# Patient Record
Sex: Female | Born: 1935 | Race: White | Hispanic: No | Smoking: Never smoker
Health system: Southern US, Community
[De-identification: ages and names within clinical notes are randomized; demographics above are authoritative.]

## PROBLEM LIST (undated history)

## (undated) DIAGNOSIS — K219 Gastro-esophageal reflux disease without esophagitis: Secondary | ICD-10-CM

## (undated) DIAGNOSIS — K56609 Unspecified intestinal obstruction, unspecified as to partial versus complete obstruction: Secondary | ICD-10-CM

## (undated) DIAGNOSIS — E039 Hypothyroidism, unspecified: Secondary | ICD-10-CM

## (undated) DIAGNOSIS — K2270 Barrett's esophagus without dysplasia: Secondary | ICD-10-CM

## (undated) DIAGNOSIS — E079 Disorder of thyroid, unspecified: Secondary | ICD-10-CM

## (undated) DIAGNOSIS — K913 Postprocedural intestinal obstruction, unspecified as to partial versus complete: Secondary | ICD-10-CM

## (undated) DIAGNOSIS — Z8719 Personal history of other diseases of the digestive system: Secondary | ICD-10-CM

## (undated) DIAGNOSIS — F419 Anxiety disorder, unspecified: Secondary | ICD-10-CM

## (undated) DIAGNOSIS — M1990 Unspecified osteoarthritis, unspecified site: Secondary | ICD-10-CM

## (undated) DIAGNOSIS — Q433 Congenital malformations of intestinal fixation: Secondary | ICD-10-CM

## (undated) DIAGNOSIS — R112 Nausea with vomiting, unspecified: Secondary | ICD-10-CM

## (undated) DIAGNOSIS — K5652 Intestinal adhesions [bands] with complete obstruction: Secondary | ICD-10-CM

## (undated) HISTORY — DX: Congenital malformations of intestinal fixation: Q43.3

## (undated) HISTORY — DX: Unspecified intestinal obstruction, unspecified as to partial versus complete obstruction: K56.609

## (undated) HISTORY — DX: Nausea with vomiting, unspecified: R11.2

## (undated) HISTORY — PX: TUBAL LIGATION: SHX77

## (undated) HISTORY — DX: Postprocedural intestinal obstruction, unspecified as to partial versus complete: K91.30

## (undated) HISTORY — DX: Intestinal adhesions (bands) with complete obstruction: K56.52

## (undated) HISTORY — PX: TONSILLECTOMY: SUR1361

---

## 1998-03-17 DIAGNOSIS — K2270 Barrett's esophagus without dysplasia: Secondary | ICD-10-CM

## 1998-03-17 HISTORY — DX: Barrett's esophagus without dysplasia: K22.70

## 2000-03-17 HISTORY — PX: PARTIAL COLECTOMY: SHX5273

## 2000-03-17 HISTORY — PX: APPENDECTOMY: SHX54

## 2015-03-29 DIAGNOSIS — H25813 Combined forms of age-related cataract, bilateral: Secondary | ICD-10-CM | POA: Diagnosis not present

## 2015-04-18 DIAGNOSIS — K6389 Other specified diseases of intestine: Secondary | ICD-10-CM | POA: Diagnosis not present

## 2015-04-18 DIAGNOSIS — K5900 Constipation, unspecified: Secondary | ICD-10-CM | POA: Diagnosis not present

## 2015-04-19 DIAGNOSIS — E785 Hyperlipidemia, unspecified: Secondary | ICD-10-CM | POA: Diagnosis not present

## 2015-04-26 DIAGNOSIS — E785 Hyperlipidemia, unspecified: Secondary | ICD-10-CM | POA: Diagnosis not present

## 2015-04-26 DIAGNOSIS — E039 Hypothyroidism, unspecified: Secondary | ICD-10-CM | POA: Diagnosis not present

## 2015-04-26 DIAGNOSIS — Z0000 Encounter for general adult medical examination without abnormal findings: Secondary | ICD-10-CM | POA: Diagnosis not present

## 2015-04-26 DIAGNOSIS — K219 Gastro-esophageal reflux disease without esophagitis: Secondary | ICD-10-CM | POA: Diagnosis not present

## 2015-08-16 DIAGNOSIS — Z79899 Other long term (current) drug therapy: Secondary | ICD-10-CM | POA: Diagnosis not present

## 2015-08-23 DIAGNOSIS — R1310 Dysphagia, unspecified: Secondary | ICD-10-CM | POA: Diagnosis not present

## 2015-08-23 DIAGNOSIS — E785 Hyperlipidemia, unspecified: Secondary | ICD-10-CM | POA: Diagnosis not present

## 2015-08-23 DIAGNOSIS — E039 Hypothyroidism, unspecified: Secondary | ICD-10-CM | POA: Diagnosis not present

## 2015-09-04 DIAGNOSIS — R109 Unspecified abdominal pain: Secondary | ICD-10-CM

## 2015-09-04 DIAGNOSIS — G8929 Other chronic pain: Secondary | ICD-10-CM | POA: Insufficient documentation

## 2015-09-04 DIAGNOSIS — E039 Hypothyroidism, unspecified: Secondary | ICD-10-CM | POA: Insufficient documentation

## 2015-09-04 DIAGNOSIS — E785 Hyperlipidemia, unspecified: Secondary | ICD-10-CM | POA: Insufficient documentation

## 2015-09-04 DIAGNOSIS — K219 Gastro-esophageal reflux disease without esophagitis: Secondary | ICD-10-CM | POA: Insufficient documentation

## 2015-09-05 DIAGNOSIS — R1310 Dysphagia, unspecified: Secondary | ICD-10-CM | POA: Diagnosis not present

## 2015-09-05 DIAGNOSIS — K5790 Diverticulosis of intestine, part unspecified, without perforation or abscess without bleeding: Secondary | ICD-10-CM | POA: Diagnosis not present

## 2015-09-05 DIAGNOSIS — K219 Gastro-esophageal reflux disease without esophagitis: Secondary | ICD-10-CM | POA: Diagnosis not present

## 2015-09-26 DIAGNOSIS — G8929 Other chronic pain: Secondary | ICD-10-CM | POA: Diagnosis not present

## 2015-09-26 DIAGNOSIS — R109 Unspecified abdominal pain: Secondary | ICD-10-CM | POA: Diagnosis not present

## 2015-09-26 DIAGNOSIS — R1319 Other dysphagia: Secondary | ICD-10-CM | POA: Insufficient documentation

## 2015-09-26 DIAGNOSIS — K219 Gastro-esophageal reflux disease without esophagitis: Secondary | ICD-10-CM | POA: Diagnosis not present

## 2015-09-26 DIAGNOSIS — R1311 Dysphagia, oral phase: Secondary | ICD-10-CM | POA: Diagnosis not present

## 2015-09-26 DIAGNOSIS — R1310 Dysphagia, unspecified: Secondary | ICD-10-CM | POA: Insufficient documentation

## 2015-12-24 DIAGNOSIS — H25813 Combined forms of age-related cataract, bilateral: Secondary | ICD-10-CM | POA: Diagnosis not present

## 2015-12-27 DIAGNOSIS — Z23 Encounter for immunization: Secondary | ICD-10-CM | POA: Diagnosis not present

## 2016-02-13 DIAGNOSIS — E038 Other specified hypothyroidism: Secondary | ICD-10-CM | POA: Diagnosis not present

## 2016-02-13 DIAGNOSIS — R1311 Dysphagia, oral phase: Secondary | ICD-10-CM | POA: Diagnosis not present

## 2016-02-13 DIAGNOSIS — E782 Mixed hyperlipidemia: Secondary | ICD-10-CM | POA: Diagnosis not present

## 2016-02-13 DIAGNOSIS — M810 Age-related osteoporosis without current pathological fracture: Secondary | ICD-10-CM | POA: Diagnosis not present

## 2016-03-26 DIAGNOSIS — M85852 Other specified disorders of bone density and structure, left thigh: Secondary | ICD-10-CM | POA: Diagnosis not present

## 2016-03-26 DIAGNOSIS — M810 Age-related osteoporosis without current pathological fracture: Secondary | ICD-10-CM | POA: Diagnosis not present

## 2016-03-26 DIAGNOSIS — Z13820 Encounter for screening for osteoporosis: Secondary | ICD-10-CM | POA: Diagnosis not present

## 2016-06-27 DIAGNOSIS — E785 Hyperlipidemia, unspecified: Secondary | ICD-10-CM | POA: Diagnosis not present

## 2016-07-03 DIAGNOSIS — R938 Abnormal findings on diagnostic imaging of other specified body structures: Secondary | ICD-10-CM | POA: Diagnosis not present

## 2016-07-03 DIAGNOSIS — R05 Cough: Secondary | ICD-10-CM | POA: Diagnosis not present

## 2016-07-03 DIAGNOSIS — J0100 Acute maxillary sinusitis, unspecified: Secondary | ICD-10-CM | POA: Diagnosis not present

## 2016-07-03 DIAGNOSIS — J208 Acute bronchitis due to other specified organisms: Secondary | ICD-10-CM | POA: Diagnosis not present

## 2016-07-16 DIAGNOSIS — M4125 Other idiopathic scoliosis, thoracolumbar region: Secondary | ICD-10-CM | POA: Insufficient documentation

## 2016-07-16 DIAGNOSIS — E782 Mixed hyperlipidemia: Secondary | ICD-10-CM | POA: Diagnosis not present

## 2016-07-16 DIAGNOSIS — E038 Other specified hypothyroidism: Secondary | ICD-10-CM | POA: Diagnosis not present

## 2016-07-16 DIAGNOSIS — E063 Autoimmune thyroiditis: Secondary | ICD-10-CM | POA: Diagnosis not present

## 2016-07-19 DIAGNOSIS — N3001 Acute cystitis with hematuria: Secondary | ICD-10-CM | POA: Diagnosis not present

## 2016-07-19 DIAGNOSIS — R300 Dysuria: Secondary | ICD-10-CM | POA: Diagnosis not present

## 2016-11-12 ENCOUNTER — Encounter: Payer: Self-pay | Admitting: Emergency Medicine

## 2016-11-12 DIAGNOSIS — K219 Gastro-esophageal reflux disease without esophagitis: Secondary | ICD-10-CM | POA: Diagnosis present

## 2016-11-12 DIAGNOSIS — Z9889 Other specified postprocedural states: Secondary | ICD-10-CM

## 2016-11-12 DIAGNOSIS — K56699 Other intestinal obstruction unspecified as to partial versus complete obstruction: Secondary | ICD-10-CM | POA: Diagnosis not present

## 2016-11-12 DIAGNOSIS — Z885 Allergy status to narcotic agent status: Secondary | ICD-10-CM | POA: Diagnosis not present

## 2016-11-12 DIAGNOSIS — K562 Volvulus: Secondary | ICD-10-CM | POA: Diagnosis not present

## 2016-11-12 DIAGNOSIS — Z9049 Acquired absence of other specified parts of digestive tract: Secondary | ICD-10-CM | POA: Diagnosis not present

## 2016-11-12 DIAGNOSIS — R112 Nausea with vomiting, unspecified: Secondary | ICD-10-CM | POA: Diagnosis not present

## 2016-11-12 DIAGNOSIS — R1110 Vomiting, unspecified: Secondary | ICD-10-CM | POA: Diagnosis not present

## 2016-11-12 DIAGNOSIS — Z9851 Tubal ligation status: Secondary | ICD-10-CM | POA: Diagnosis not present

## 2016-11-12 DIAGNOSIS — R9431 Abnormal electrocardiogram [ECG] [EKG]: Secondary | ICD-10-CM | POA: Diagnosis not present

## 2016-11-12 DIAGNOSIS — K56609 Unspecified intestinal obstruction, unspecified as to partial versus complete obstruction: Secondary | ICD-10-CM | POA: Diagnosis not present

## 2016-11-12 DIAGNOSIS — K5900 Constipation, unspecified: Secondary | ICD-10-CM | POA: Diagnosis not present

## 2016-11-12 LAB — CBC
HCT: 43.2 % (ref 35.0–47.0)
HEMOGLOBIN: 14.8 g/dL (ref 12.0–16.0)
MCH: 29.7 pg (ref 26.0–34.0)
MCHC: 34.3 g/dL (ref 32.0–36.0)
MCV: 86.5 fL (ref 80.0–100.0)
Platelets: 251 10*3/uL (ref 150–440)
RBC: 4.99 MIL/uL (ref 3.80–5.20)
RDW: 13.5 % (ref 11.5–14.5)
WBC: 11.9 10*3/uL — ABNORMAL HIGH (ref 3.6–11.0)

## 2016-11-12 NOTE — ED Notes (Signed)
Pt unable to void at this time. 

## 2016-11-12 NOTE — ED Triage Notes (Addendum)
Pt to triage via wheelchair. Pt has constipation and vomiting   Vomiting x 4.  Last bm was yesterday.  Denies abd pain   No chest pain or sob.   Pt alert.  Speech clear.

## 2016-11-13 ENCOUNTER — Inpatient Hospital Stay
Admission: EM | Admit: 2016-11-13 | Discharge: 2016-11-14 | DRG: 390 | Disposition: A | Discharge status: Home / Self Care | Payer: Medicare Other | Attending: Surgery | Admitting: Surgery

## 2016-11-13 ENCOUNTER — Emergency Department: Payer: Medicare Other

## 2016-11-13 DIAGNOSIS — R109 Unspecified abdominal pain: Secondary | ICD-10-CM | POA: Diagnosis not present

## 2016-11-13 DIAGNOSIS — Q433 Congenital malformations of intestinal fixation: Secondary | ICD-10-CM | POA: Diagnosis not present

## 2016-11-13 DIAGNOSIS — R112 Nausea with vomiting, unspecified: Secondary | ICD-10-CM

## 2016-11-13 DIAGNOSIS — K219 Gastro-esophageal reflux disease without esophagitis: Secondary | ICD-10-CM | POA: Diagnosis present

## 2016-11-13 DIAGNOSIS — Z9889 Other specified postprocedural states: Secondary | ICD-10-CM | POA: Diagnosis not present

## 2016-11-13 DIAGNOSIS — Z9851 Tubal ligation status: Secondary | ICD-10-CM | POA: Diagnosis not present

## 2016-11-13 DIAGNOSIS — K5900 Constipation, unspecified: Secondary | ICD-10-CM | POA: Diagnosis not present

## 2016-11-13 DIAGNOSIS — Z9049 Acquired absence of other specified parts of digestive tract: Secondary | ICD-10-CM | POA: Diagnosis not present

## 2016-11-13 DIAGNOSIS — Z885 Allergy status to narcotic agent status: Secondary | ICD-10-CM | POA: Diagnosis not present

## 2016-11-13 DIAGNOSIS — K562 Volvulus: Secondary | ICD-10-CM | POA: Diagnosis present

## 2016-11-13 DIAGNOSIS — K56609 Unspecified intestinal obstruction, unspecified as to partial versus complete obstruction: Secondary | ICD-10-CM

## 2016-11-13 DIAGNOSIS — R1110 Vomiting, unspecified: Secondary | ICD-10-CM | POA: Diagnosis not present

## 2016-11-13 HISTORY — DX: Gastro-esophageal reflux disease without esophagitis: K21.9

## 2016-11-13 HISTORY — DX: Disorder of thyroid, unspecified: E07.9

## 2016-11-13 HISTORY — DX: Unspecified intestinal obstruction, unspecified as to partial versus complete obstruction: K56.609

## 2016-11-13 LAB — COMPREHENSIVE METABOLIC PANEL
ALBUMIN: 4.3 g/dL (ref 3.5–5.0)
ALK PHOS: 64 U/L (ref 38–126)
ALT: 24 U/L (ref 14–54)
AST: 33 U/L (ref 15–41)
Anion gap: 13 (ref 5–15)
BILIRUBIN TOTAL: 1.6 mg/dL — AB (ref 0.3–1.2)
BUN: 15 mg/dL (ref 6–20)
CALCIUM: 10.2 mg/dL (ref 8.9–10.3)
CO2: 28 mmol/L (ref 22–32)
CREATININE: 0.78 mg/dL (ref 0.44–1.00)
Chloride: 98 mmol/L — ABNORMAL LOW (ref 101–111)
GFR calc Af Amer: >60 mL/min (ref >60)
GFR calc non Af Amer: >60 mL/min (ref >60)
GLUCOSE: 183 mg/dL — AB (ref 65–99)
Potassium: 3.7 mmol/L (ref 3.5–5.1)
SODIUM: 139 mmol/L (ref 135–145)
TOTAL PROTEIN: 7.3 g/dL (ref 6.5–8.1)

## 2016-11-13 LAB — URINALYSIS, COMPLETE (UACMP) WITH MICROSCOPIC
BACTERIA UA: NONE SEEN
BILIRUBIN URINE: NEGATIVE
GLUCOSE, UA: NEGATIVE mg/dL
Hgb urine dipstick: NEGATIVE
KETONES UR: 20 mg/dL — AB
Nitrite: NEGATIVE
PH: 8 (ref 5.0–8.0)
Protein, ur: NEGATIVE mg/dL
Specific Gravity, Urine: 1.011 (ref 1.005–1.030)

## 2016-11-13 LAB — LACTIC ACID, PLASMA
Lactic Acid, Venous: 1.5 mmol/L (ref 0.5–1.9)
Lactic Acid, Venous: 1.2 mmol/L (ref 0.5–1.9)

## 2016-11-13 LAB — LIPASE, BLOOD: Lipase: 25 U/L (ref 11–51)

## 2016-11-13 MED ORDER — IOPAMIDOL (ISOVUE-300) INJECTION 61%
15.0000 mL | INTRAVENOUS | Status: AC
  Administered 2016-11-13 (×2): 15 mL via ORAL

## 2016-11-13 MED ORDER — ONDANSETRON HCL 4 MG/2ML IJ SOLN: 4.0000 mg | Freq: Four times a day (QID) | INTRAMUSCULAR | Status: DC | PRN

## 2016-11-13 MED ORDER — SODIUM CHLORIDE 0.9 % IV BOLUS (SEPSIS)
1000.0000 mL | Freq: Once | INTRAVENOUS | Status: AC
  Administered 2016-11-13: 1000 mL via INTRAVENOUS

## 2016-11-13 MED ORDER — BISACODYL 10 MG RE SUPP
10.0000 mg | Freq: Once | RECTAL | Status: AC
  Administered 2016-11-13: 10 mg via RECTAL
  Filled 2016-11-13: qty 1

## 2016-11-13 MED ORDER — KCL IN DEXTROSE-NACL 20-5-0.45 MEQ/L-%-% IV SOLN
INTRAVENOUS | Status: DC
  Administered 2016-11-13 (×2): via INTRAVENOUS
  Administered 2016-11-13: 1000 mL via INTRAVENOUS
  Administered 2016-11-14: 07:00:00 via INTRAVENOUS
  Filled 2016-11-13 (×6): qty 1000

## 2016-11-13 MED ORDER — LACTATED RINGERS IV BOLUS (SEPSIS)
500.0000 mL | Freq: Once | INTRAVENOUS | Status: DC
  Filled 2016-11-13: qty 500

## 2016-11-13 MED ORDER — ONDANSETRON HCL 4 MG/2ML IJ SOLN
INTRAMUSCULAR | Status: AC
  Administered 2016-11-13: 4 mg
  Filled 2016-11-13: qty 2

## 2016-11-13 MED ORDER — ONDANSETRON HCL 4 MG PO TABS: 4.0000 mg | ORAL_TABLET | Freq: Four times a day (QID) | ORAL | Status: DC | PRN

## 2016-11-13 MED ORDER — ONDANSETRON HCL 4 MG/2ML IJ SOLN
4.0000 mg | Freq: Once | INTRAMUSCULAR | Status: AC
  Administered 2016-11-13: 4 mg via INTRAVENOUS
  Filled 2016-11-13: qty 2

## 2016-11-13 MED ORDER — MORPHINE SULFATE (PF) 2 MG/ML IV SOLN: 2.0000 mg | INTRAVENOUS | Status: DC | PRN

## 2016-11-13 MED ORDER — IOPAMIDOL (ISOVUE-300) INJECTION 61%
100.0000 mL | Freq: Once | INTRAVENOUS | Status: AC | PRN
  Administered 2016-11-13: 100 mL via INTRAVENOUS

## 2016-11-13 MED ORDER — HEPARIN SODIUM (PORCINE) 5000 UNIT/ML IJ SOLN
5000.0000 [IU] | Freq: Three times a day (TID) | INTRAMUSCULAR | Status: DC
  Administered 2016-11-13 – 2016-11-14 (×3): 5000 [IU] via SUBCUTANEOUS
  Filled 2016-11-13 (×3): qty 1

## 2016-11-13 NOTE — ED Notes (Signed)
EKG performed, given to doctor and exported.

## 2016-11-13 NOTE — ED Notes (Signed)
Pt back from CT

## 2016-11-13 NOTE — ED Notes (Signed)
Pt transported to room 211 by this tech.

## 2016-11-13 NOTE — ED Notes (Signed)
Pt up to toilet 

## 2016-11-13 NOTE — Progress Notes (Signed)
Visit patient this afternoon. She has her nasogastric tube in place. She states she's passed gas now. She has no abdominal pain. No nausea vomiting and no fevers or chills.  Vital signs are reviewed  Abdomen is distended tympanitic but completely nontender. Nasogastric tube is in place  We'll continue nasogastric suction at this time and reexamine. Should the patient worsen at any time I would recommend surgery and if this does not improve by tomorrow I would recommend surgery as well due to the high risk of bowel ischemia. She is not showing any signs of bowel ischemia at this time but will be observed.

## 2016-11-13 NOTE — Progress Notes (Signed)
Initial Nutrition Assessment  DOCUMENTATION CODES:   Non-severe (moderate) malnutrition in context of chronic illness  INTERVENTION:   RD will order supplements when diet advanced; pt likes vanilla Ensure.  If unable to initiate diet in 2-3 days recommend nutrition support via TPN as pt without inadequate oral intake for 3 days pta.   NUTRITION DIAGNOSIS:   Malnutrition (moderate) related to  (chronic constipation and advanced age ) as evidenced by severe depletion of muscle mass over entire body, moderate depletions of body fat in chest and orbital regions.  GOAL:   Patient will meet greater than or equal to 90% of their needs  MONITOR:   Diet advancement, Labs, Weight trends, I & O's  REASON FOR ASSESSMENT:   Malnutrition Screening Tool    ASSESSMENT:   82 year old female who comes into the hospital today with some vomiting and some abdominal pain. The patient has also had constipation and has not had a bowel movement since Saturday. 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   Met with pt in room today. Pt reports poor appetite and oral intake for 3 days pta r/t nausea and constipation. Pt with NGT in place and reports nausea resolved today but complains of sore throat from tube. Pt reports chronic constipation over the past 1-2 years. She states that she has to take Miralax every day or she will become constipated. Pt avoids fiber all together as she states that this worsen's her constipation. RD briefly discussed the differences between soluble and insoluable fiber; RD will educate pt on which types of fiber she could benefit from prior to discharge. RD also briefly discussed adequate hydration. Pt reports A UBW of 113lbs; it appears that pt is weight stable. Pt also reports a history of esophageal stricture and multiple dilations "many years ago". Pt reports that she has to cut her foods, especially meats into small bites. Pt will need  Dysphagia 3 diet upon diet advancement. Pt does drink Ensure at home but not regularly. Pt with severe muscle depletions and moderate to severe fat depletions. Recommend nutrition support via TPN if unable to initiate diet in 2-3 days. Per MD note, conservative management for now to see if volvulus resolves; pt may need surgery eventually.    Medications reviewed and include: heparin, NaCl w/ 5% Dex and KCl @125ml /hr- provides 510kcal/day   Labs reviewed: Cl 98(L), tbili 1.6(H) Wbc- 11.9(H)  Nutrition-Focused physical exam completed. Findings are moderate fat depletion in orbital regions and chest, severe muscle depletions over entire body, and no edema.   Diet Order:   NPO  Skin:  Reviewed, no issues  Last BM:  8/25  Height:   Ht Readings from Last 1 Encounters:  11/12/16 5' 2"  (1.575 m)    Weight:   Wt Readings from Last 1 Encounters:  11/12/16 115 lb (52.2 kg)    Ideal Body Weight:  50 kg  BMI:  Body mass index is 21.03 kg/m.  Estimated Nutritional Needs:   Kcal:  1600-1800kcal/day   Protein:  78-89g/day   Fluid:  >1.6L/day   EDUCATION NEEDS:   Education needs addressed  Koleen Distance MS, RD, LDN Pager #[phone] After Hours Pager: [phone]

## 2016-11-13 NOTE — H&P (Signed)
Diana Owen is an 82 y.o. female.    Chief Complaint: Nausea and vomiting  HPI: This a patient with a 5-7 day history of nausea and vomiting. She believes she was constipated last week on Thursday and took some medication to help her with bowel movements. She had bowel movements on Saturday and passed gas on Saturday but has not passed any gas nor she had any bowel movements since Saturday she's been vomiting multiple times since then. She denies any abdominal pain at this time.  Of significance is the patient had this happen once before. She was down at [facility] at the time and was treated conservatively. She thinks she had a nasogastric tube but it spontaneously resolved and she had no problems for the last 2-1/2 years since that occurrence. She has had a prior colon resection for a polyp. She's also had an appendectomy and a tubal ligation.  I was asked see the patient by the emergency room physician for CT findings of a midgut volvulus.  Patient's not had a recent colonoscopy she has no weight loss but has lost height.  Patient recently moved from the [facility] area 4 weeks ago and cares for her husband who has dementia. She does not smoke or drink she is otherwise unemployed.  No family history of colon cancer etc.  Past Medical History:  Diagnosis Date  . GERD (gastroesophageal reflux disease)   . Thyroid disease     Past Surgical History:  Procedure Laterality Date  . APPENDECTOMY    . PARTIAL COLECTOMY    . TONSILLECTOMY    . TUBAL LIGATION      No family history on file. Social History:  reports that she has never smoked. She does not have any smokeless tobacco history on file. She reports that she does not drink alcohol. Her drug history is not on file.  Allergies:  Allergies  Allergen Reactions  . Codeine Nausea And Vomiting     (Not in a hospital admission)   Review of Systems  Constitutional: Negative for chills, fever and weight loss.   HENT: Negative.   Eyes: Negative.   Respiratory: Negative.   Cardiovascular: Negative.   Gastrointestinal: Positive for constipation, nausea and vomiting. Negative for abdominal pain, blood in stool, diarrhea, heartburn and melena.  Genitourinary: Negative.   Musculoskeletal: Negative.   Skin: Negative.   Neurological: Negative.   Endo/Heme/Allergies: Negative.   Psychiatric/Behavioral: Negative.      Physical Exam:  BP 130/68   Pulse 61   Temp 98.9 F (37.2 C) (Oral)   Resp 18   Ht 5' 2"  (1.575 m)   Wt 115 lb (52.2 kg)   SpO2 97%   BMI 21.03 kg/m   Physical Exam  Constitutional: She is oriented to person, place, and time and well-developed, well-nourished, and in no distress. No distress.  Appears comfortable no acute distress  HENT:  Head: Normocephalic and atraumatic.  Eyes: Pupils are equal, round, and reactive to light. Right eye exhibits no discharge. Left eye exhibits no discharge. No scleral icterus.  Neck: Normal range of motion. No JVD present. No thyromegaly present.  Cardiovascular: Normal rate and regular rhythm.   Pulmonary/Chest: Effort normal. No respiratory distress.  Abdominal: Soft. She exhibits distension. There is no tenderness. There is no rebound and no guarding.  Low midline scar extending slightly above the umbilicus. No sign of hernia abdomen is distended and tympanitic but nontender no peritoneal signs  Musculoskeletal: Normal range of motion. She exhibits  no edema or tenderness.  Lymphadenopathy:    She has no cervical adenopathy.  Neurological: She is alert and oriented to person, place, and time.  Skin: Skin is warm and dry. No rash noted. She is not diaphoretic. No erythema.  Psychiatric: Mood and affect normal.  Vitals reviewed.       Results for orders placed or performed during the hospital encounter of 11/13/16 (from the past 48 hour(s))  Lipase, blood     Status: None   Collection Time: 11/12/16 11:41 PM  Result Value Ref  Range   Lipase 25 11 - 51 U/L  Comprehensive metabolic panel     Status: Abnormal   Collection Time: 11/12/16 11:41 PM  Result Value Ref Range   Sodium 139 135 - 145 mmol/L   Potassium 3.7 3.5 - 5.1 mmol/L   Chloride 98 (L) 101 - 111 mmol/L   CO2 28 22 - 32 mmol/L   Glucose, Bld 183 (H) 65 - 99 mg/dL   BUN 15 6 - 20 mg/dL   Creatinine, Ser 0.78 0.44 - 1.00 mg/dL   Calcium 10.2 8.9 - 10.3 mg/dL   Total Protein 7.3 6.5 - 8.1 g/dL   Albumin 4.3 3.5 - 5.0 g/dL   AST 33 15 - 41 U/L   ALT 24 14 - 54 U/L   Alkaline Phosphatase 64 38 - 126 U/L   Total Bilirubin 1.6 (H) 0.3 - 1.2 mg/dL   GFR calc non Af Amer >60 >60 mL/min   GFR calc Af Amer >60 >60 mL/min    Comment: (NOTE) The eGFR has been calculated using the CKD EPI equation. This calculation has not been validated in all clinical situations. eGFR's persistently <60 mL/min signify possible Chronic Kidney Disease.    Anion gap 13 5 - 15  CBC     Status: Abnormal   Collection Time: 11/12/16 11:41 PM  Result Value Ref Range   WBC 11.9 (H) 3.6 - 11.0 K/uL   RBC 4.99 3.80 - 5.20 MIL/uL   Hemoglobin 14.8 12.0 - 16.0 g/dL   HCT 43.2 35.0 - 47.0 %   MCV 86.5 80.0 - 100.0 fL   MCH 29.7 26.0 - 34.0 pg   MCHC 34.3 32.0 - 36.0 g/dL   RDW 13.5 11.5 - 14.5 %   Platelets 251 150 - 440 K/uL  Urinalysis, Complete w Microscopic     Status: Abnormal   Collection Time: 11/13/16  5:53 AM  Result Value Ref Range   Color, Urine YELLOW (A) YELLOW   APPearance CLOUDY (A) CLEAR   Specific Gravity, Urine 1.011 1.005 - 1.030   pH 8.0 5.0 - 8.0   Glucose, UA NEGATIVE NEGATIVE mg/dL   Hgb urine dipstick NEGATIVE NEGATIVE   Bilirubin Urine NEGATIVE NEGATIVE   Ketones, ur 20 (A) NEGATIVE mg/dL   Protein, ur NEGATIVE NEGATIVE mg/dL   Nitrite NEGATIVE NEGATIVE   Leukocytes, UA TRACE (A) NEGATIVE   RBC / HPF 0-5 0 - 5 RBC/hpf   WBC, UA 0-5 0 - 5 WBC/hpf   Bacteria, UA NONE SEEN NONE SEEN   Squamous Epithelial / LPF 0-5 (A) NONE SEEN   Mucus  PRESENT    Non Squamous Epithelial 0-5 (A) NONE SEEN  Lactic acid, plasma     Status: None   Collection Time: 11/13/16  6:50 AM  Result Value Ref Range   Lactic Acid, Venous 1.5 0.5 - 1.9 mmol/L   Ct Abdomen Pelvis W Contrast  Result Date: 11/13/2016 CLINICAL DATA:  Constipation and vomiting. Bilious vomiting. History of hysterectomy, partial colectomy and appendectomy. EXAM: CT ABDOMEN AND PELVIS WITH CONTRAST TECHNIQUE: Multidetector CT imaging of the abdomen and pelvis was performed using the standard protocol following bolus administration of intravenous contrast. CONTRAST:  113m ISOVUE-300 IOPAMIDOL (ISOVUE-300) INJECTION 61% COMPARISON:  None. FINDINGS: LOWER CHEST: Lung bases are clear. Included heart size is normal. No pericardial effusion. HEPATOBILIARY: Dilated intra and extrahepatic portal veins. Mild periportal edema. Normal gallbladder. PANCREAS: Normal. SPLEEN: Normal. ADRENALS/URINARY TRACT: Kidneys are orthotopic, demonstrating symmetric enhancement. No nephrolithiasis, hydronephrosis or solid renal masses. The unopacified ureters are normal in course and caliber. Delayed imaging through the kidneys demonstrates symmetric prompt contrast excretion within the proximal urinary collecting system. Urinary bladder is partially distended and unremarkable. Normal adrenal glands. STOMACH/BOWEL: Dilated small bowel to 3.9 cm with transition point mid abdomen associated with swirled mesenteries best appreciated on coronal imaging. No pneumatosis. Ligament of Treitz is to the LEFT. Rectosigmoid bowel anastomosis. Moderate amount of retained large bowel stool. VASCULAR/LYMPHATIC: Aortoiliac vessels are normal in caliber, tortuous course. Mild calcific atherosclerosis. No lymphadenopathy by CT size criteria. REPRODUCTIVE: Status post hysterectomy. OTHER: Small amount of free fluid in the pelvis. No intraperitoneal free air. No drainable fluid collection. MUSCULOSKELETAL: Nonacute. Thoracolumbar  levoscoliosis with severe degenerative change of the lumbar spine. IMPRESSION: 1. High-grade small bowel obstruction with mid abdominal transition point secondary to midgut volvulus, less likely internal hernia. No bowel necrosis. 2. Prominent portal vein, likely secondary to probable volvulus. 3. Moderate retained large bowel stool. Rectosigmoid bowel anastomosis. 4. Acute findings discussed with and reconfirmed by Dr.ALLISON WEBSTER on 11/13/2016 at 6:53 am. Aortic Atherosclerosis (ICD10-I70.0). Electronically Signed   By: CElon AlasM.D.   On: 11/13/2016 06:55     Assessment/Plan  This is a patient with CT findings of a midgut volvulus. This is been personally reviewed. My initial impression is being with the emergency room physician in reviewing the films was that this patient would require emergency operation for reduction of this midgut volvulus.  However the patient has had this been going on for 7-5 days and has had it happen before where it spontaneously resolved. She also has no laboratory vital signs or physical exam findings to suggest ischemia or impending ischemia. With that in mind we could try a nasogastric tube and see if this reduces itself but I would be quick to recommend surgical intervention should she not promptly resolve due to the risk of mesenteric ischemia. This reviewed for her and her significant other as well as with the emergency room physician. Over 60 minutes were spent in evaluating this patient personally and evaluating films and labs.  RFlorene Glen MD, FACS

## 2016-11-13 NOTE — ED Provider Notes (Signed)
[facility] Emergency Department Provider Note   ____________________________________________   First MD Initiated Contact with Patient 11/13/16 [phone]     (approximate)  I have reviewed the triage vital signs and the nursing notes.   HISTORY  Chief Complaint Constipation and Emesis    HPI Diana Owen is a 82 y.o. female Who comes into the hospital today with some constipation and vomiting. The patient reports that she continued on and on vomiting and could not stopped. She states that this has happened before. She normally takes MiraLAX and forgot to take it for a few days last week. She also states that she had eaten right. She took Colace and reports that by Saturday morning she was able to have a bowel movement. She reports that the vomiting got better but then she was unsure exactly what to eat. She was eating less and less but still taking MiraLAX. Today the patient felt worse and the vomiting return. She reports that initially it was food and then sometimes it was green and then it was just clear. The patient reports that she's had constipation and bowel blockages in the past but her family member is unsure if it's been bowel obstructions or just due to constipation. The patient had some abdominal pain earlier but reports it is completely gone now. She is here today for evaluation.   Past Medical History:  Diagnosis Date  . GERD (gastroesophageal reflux disease)   . Thyroid disease     Patient Active Problem List   Diagnosis Date Noted  . SBO (small bowel obstruction) (HCC) 11/13/2016  . Intractable vomiting with nausea   . Midgut volvulus   . Small bowel obstruction [facility])     Past Surgical History:  Procedure Laterality Date  . APPENDECTOMY    . PARTIAL COLECTOMY    . TONSILLECTOMY    . TUBAL LIGATION      Prior to Admission medications   Medication Sig Start Date End Date Taking? Authorizing Provider  levothyroxine (SYNTHROID,  LEVOTHROID) 100 MCG tablet Take 100 mcg by mouth daily. 04/23/16  Yes [provider]    Allergies Codeine  No family history on file.  Social History Social History  Substance Use Topics  . Smoking status: Never Smoker  . Smokeless tobacco: Not on file  . Alcohol use No    Review of Systems  Constitutional: No fever/chills Eyes: No visual changes. ENT: No sore throat. Cardiovascular: Denies chest pain. Respiratory: Denies shortness of breath. Gastrointestinal: abdominal pain,  nausea, vomiting, constipation. Genitourinary: Negative for dysuria. Musculoskeletal: Negative for back pain. Skin: Negative for rash. Neurological: Negative for headaches, focal weakness or numbness.   ____________________________________________   PHYSICAL EXAM:  VITAL SIGNS: ED Triage Vitals  Enc Vitals Group     BP 11/12/16 2342 125/64     Pulse Rate 11/12/16 2342 63     Resp 11/12/16 2342 20     Temp 11/12/16 2342 98.9 F (37.2 C)     Temp Source 11/12/16 2342 Oral     SpO2 11/12/16 2342 99 %     Weight 11/12/16 2342 115 lb (52.2 kg)     Height 11/12/16 2342 5\' 2"  (1.575 m)     Head Circumference --      Peak Flow --      Pain Score 11/12/16 2341 0     Pain Loc --      Pain Edu? --      Excl. in GC? --  Constitutional: Alert and oriented. Well appearing and in moderate distress. Eyes: Conjunctivae are normal. PERRL. EOMI. Head: Atraumatic. Nose: No congestion/rhinnorhea. Mouth/Throat: Mucous membranes are moist.  Oropharynx non-erythematous. Cardiovascular: Normal rate, regular rhythm. Grossly normal heart sounds.  Good peripheral circulation. Respiratory: Normal respiratory effort.  No retractions. Lungs CTAB. Gastrointestinal: Soft and nontender. Mild lower abd distention. Positive bowel sounds Musculoskeletal: No lower extremity tenderness nor edema.  Neurologic:  Normal speech and language.  Skin:  Skin is warm, dry and intact. Psychiatric: Mood and affect  are normal.   ____________________________________________   LABS (all labs ordered are listed, but only abnormal results are displayed)  Labs Reviewed  COMPREHENSIVE METABOLIC PANEL - Abnormal; Notable for the following:       Result Value   Chloride 98 (*)    Glucose, Bld 183 (*)    Total Bilirubin 1.6 (*)    All other components within normal limits  CBC - Abnormal; Notable for the following:    WBC 11.9 (*)    All other components within normal limits  URINALYSIS, COMPLETE (UACMP) WITH MICROSCOPIC - Abnormal; Notable for the following:    Color, Urine YELLOW (*)    APPearance CLOUDY (*)    Ketones, ur 20 (*)    Leukocytes, UA TRACE (*)    Squamous Epithelial / LPF 0-5 (*)    Non Squamous Epithelial 0-5 (*)    All other components within normal limits  LIPASE, BLOOD  LACTIC ACID, PLASMA  LACTIC ACID, PLASMA   ____________________________________________  EKG  ED ECG REPORT I, Rebecka ApleyWebster,  Allison P, the attending physician, personally viewed and interpreted this ECG.   Date: 11/13/2016  EKG Time: 737  Rate: 67  Rhythm: normal sinus rhythm  Axis: normal  Intervals:none  ST&T Change: none  ____________________________________________  RADIOLOGY  Ct Abdomen Pelvis W Contrast  Result Date: 11/13/2016 CLINICAL DATA:  Constipation and vomiting. Bilious vomiting. History of hysterectomy, partial colectomy and appendectomy. EXAM: CT ABDOMEN AND PELVIS WITH CONTRAST TECHNIQUE: Multidetector CT imaging of the abdomen and pelvis was performed using the standard protocol following bolus administration of intravenous contrast. CONTRAST:  100mL ISOVUE-300 IOPAMIDOL (ISOVUE-300) INJECTION 61% COMPARISON:  None. FINDINGS: LOWER CHEST: Lung bases are clear. Included heart size is normal. No pericardial effusion. HEPATOBILIARY: Dilated intra and extrahepatic portal veins. Mild periportal edema. Normal gallbladder. PANCREAS: Normal. SPLEEN: Normal. ADRENALS/URINARY TRACT: Kidneys are  orthotopic, demonstrating symmetric enhancement. No nephrolithiasis, hydronephrosis or solid renal masses. The unopacified ureters are normal in course and caliber. Delayed imaging through the kidneys demonstrates symmetric prompt contrast excretion within the proximal urinary collecting system. Urinary bladder is partially distended and unremarkable. Normal adrenal glands. STOMACH/BOWEL: Dilated small bowel to 3.9 cm with transition point mid abdomen associated with swirled mesenteries best appreciated on coronal imaging. No pneumatosis. Ligament of Treitz is to the LEFT. Rectosigmoid bowel anastomosis. Moderate amount of retained large bowel stool. VASCULAR/LYMPHATIC: Aortoiliac vessels are normal in caliber, tortuous course. Mild calcific atherosclerosis. No lymphadenopathy by CT size criteria. REPRODUCTIVE: Status post hysterectomy. OTHER: Small amount of free fluid in the pelvis. No intraperitoneal free air. No drainable fluid collection. MUSCULOSKELETAL: Nonacute. Thoracolumbar levoscoliosis with severe degenerative change of the lumbar spine. IMPRESSION: 1. High-grade small bowel obstruction with mid abdominal transition point secondary to midgut volvulus, less likely internal hernia. No bowel necrosis. 2. Prominent portal vein, likely secondary to probable volvulus. 3. Moderate retained large bowel stool. Rectosigmoid bowel anastomosis. 4. Acute findings discussed with and reconfirmed by Dr.ALLISON WEBSTER on 11/13/2016 at 6:53  am. Aortic Atherosclerosis (ICD10-I70.0). Electronically Signed   By: Awilda Metro M.D.   On: 11/13/2016 06:55    ____________________________________________   PROCEDURES  Procedure(s) performed: None  Procedures  Critical Care performed: No  ____________________________________________   INITIAL IMPRESSION / ASSESSMENT AND PLAN / ED COURSE  Pertinent labs & imaging results that were available during my care of the patient were reviewed by me and considered in  my medical decision making (see chart for details).  This is an 82 year old female who comes into the hospital today with some vomiting and some abdominal pain. The patient has also had constipation and has not had a bowel movement since Saturday. 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 I contacted Dr. Excell Seltzer the surgeon who came down to see the patient. She received an NG tube placed and she'll be admitted to the surgical surface. She had no signs of intestinal ischemia or pneumatosis according to the radiologist.      ____________________________________________   FINAL CLINICAL IMPRESSION(S) / ED DIAGNOSES  Final diagnoses:  Intractable vomiting with nausea, unspecified vomiting type  Small bowel obstruction (HCC)  Midgut volvulus      NEW MEDICATIONS STARTED DURING THIS VISIT:  New Prescriptions   No medications on file     Note:  This document was prepared using Dragon voice recognition software and may include unintentional dictation errors.    Rebecka Apley, MD 11/13/16 [phone]

## 2016-11-14 ENCOUNTER — Inpatient Hospital Stay: Payer: Medicare Other

## 2016-11-14 LAB — CBC
HCT: 38.3 % (ref 35.0–47.0)
Hemoglobin: 13.2 g/dL (ref 12.0–16.0)
MCH: 31.1 pg (ref 26.0–34.0)
MCHC: 34.5 g/dL (ref 32.0–36.0)
MCV: 90.3 fL (ref 80.0–100.0)
PLATELETS: 183 10*3/uL (ref 150–440)
RBC: 4.24 MIL/uL (ref 3.80–5.20)
RDW: 13.6 % (ref 11.5–14.5)
WBC: 7.3 10*3/uL (ref 3.6–11.0)

## 2016-11-14 LAB — BASIC METABOLIC PANEL
Anion gap: 5 (ref 5–15)
BUN: 7 mg/dL (ref 6–20)
CO2: 30 mmol/L (ref 22–32)
CREATININE: 0.7 mg/dL (ref 0.44–1.00)
Calcium: 8.3 mg/dL — ABNORMAL LOW (ref 8.9–10.3)
Chloride: 106 mmol/L (ref 101–111)
Glucose, Bld: 130 mg/dL — ABNORMAL HIGH (ref 65–99)
POTASSIUM: 3.8 mmol/L (ref 3.5–5.1)
SODIUM: 141 mmol/L (ref 135–145)

## 2016-11-14 NOTE — Plan of Care (Signed)
Problem: Bowel/Gastric: Goal: Will not experience complications related to bowel motility Outcome: Not Progressing NGT remains patent with dark brown liquid drainage.  No complaints of nausea or vomiting this shift.

## 2016-11-14 NOTE — Discharge Instructions (Signed)
Slowly advance diet to soft. Resume normal activities. Resume any home medications. No new medications added at this admission. Follow-up with [facility] next week. Should any of your symptoms return including abdominal pain or nausea and vomiting return to the emergency room.

## 2016-11-14 NOTE — Discharge Summary (Signed)
Physician Discharge Summary  Patient ID: Diana Owen MRN: [id number] DOB/AGE: [birth year] 82 y.o.  Admit date: 11/13/2016 Discharge date: 11/14/2016   Discharge Diagnoses:  Active Problems:   SBO (small bowel obstruction) [facility](HCC)   ProceduresNone  Hospital Course: This a patient with signs of a small bowel volvulus with bowel obstruction on CT scan. She had had several days of vomiting and in the emergency room a nasogastric tube was placed after this diagnosis was made. Of interest is that the patient had this happen to a half years ago and was hospitalized at [place] where she experienced spontaneous resolution of this process. Because of her history of prior spontaneous resolution of a nearly identical situation, her lack of physical findings other than distention, and all normal labs she was observed with a nasogastric tube in place. Spontaneous resolution of this occurred whereby she started passing gas and having bowel movements her pain was gone subsequently her nasogastric tube was removed and she is tolerating a clear liquid diet. She is discharged today in stable condition with instructions to advance her diet slowly to soft diet. Because she has recently moved to the area does not have a primary care physician I would like to see her in the office at the end of next week and assist her in finding a primary care physician. She is to restart all of her home medications. She is also instructed to return to the emergency room should she have any worsening or return of her symptoms.  Consults: None  Disposition: Final discharge disposition not confirmed   Allergies as of 11/14/2016      Reactions   Codeine Nausea And Vomiting      Medication List    TAKE these medications   levothyroxine 100 MCG tablet Commonly known as:  SYNTHROID, LEVOTHROID Take 100 mcg by mouth daily.      Follow-up Information    Lattie Hawooper, Antionne Enrique E, MD Follow up in 6 day(s).   Specialty:   Surgery Contact information: [address] [place] [id number] [phone]           Lattie Hawichard E Melvin Marmo, MD, FACS

## 2016-11-14 NOTE — Progress Notes (Signed)
CC: Small bowel volvulus, resolved Subjective: Patient states she's had multiple bowel movements and is passing gas. She has no pain no nausea or vomiting NG output has been minimal.  Objective: Vital signs in last 24 hours: Temp:  [98.2 F (36.8 C)-99 F (37.2 C)] 98.2 F (36.8 C) (08/31 0503) Pulse Rate:  [53-63] 63 (08/31 0503) Resp:  [17-20] 20 (08/31 0503) BP: (114-118)/(57-82) 118/82 (08/31 0503) SpO2:  [92 %-94 %] 94 % (08/31 0503) Last BM Date: 11/13/16  Intake/Output from previous day: 08/30 0701 - 08/31 0700 In: 1708.8 [I.V.:1678.8; NG/GT:30] Out: 2000 [Urine:1000; Emesis/NG output:800] Intake/Output this shift: No intake/output data recorded.  Physical exam:  Awake alert and oriented abdomen is soft and much less distended much less tympanitic and completely nontender Calves are nontender No icterus no jaundice  Lab Results: CBC   Recent Labs  11/12/16 2341 11/14/16 0355  WBC 11.9* 7.3  HGB 14.8 13.2  HCT 43.2 38.3  PLT 251 183   BMET  Recent Labs  11/12/16 2341 11/14/16 0355  NA 139 141  K 3.7 3.8  CL 98* 106  CO2 28 30  GLUCOSE 183* 130*  BUN 15 7  CREATININE 0.78 0.70  CALCIUM 10.2 8.3*   PT/INR No results for input(s): LABPROT, INR in the last 72 hours. ABG No results for input(s): PHART, HCO3 in the last 72 hours.  Invalid input(s): PCO2, PO2  Studies/Results: Ct Abdomen Pelvis W Contrast  Result Date: 11/13/2016 CLINICAL DATA:  Constipation and vomiting. Bilious vomiting. History of hysterectomy, partial colectomy and appendectomy. EXAM: CT ABDOMEN AND PELVIS WITH CONTRAST TECHNIQUE: Multidetector CT imaging of the abdomen and pelvis was performed using the standard protocol following bolus administration of intravenous contrast. CONTRAST:  100mL ISOVUE-300 IOPAMIDOL (ISOVUE-300) INJECTION 61% COMPARISON:  None. FINDINGS: LOWER CHEST: Lung bases are clear. Included heart size is normal. No pericardial effusion. HEPATOBILIARY: Dilated  intra and extrahepatic portal veins. Mild periportal edema. Normal gallbladder. PANCREAS: Normal. SPLEEN: Normal. ADRENALS/URINARY TRACT: Kidneys are orthotopic, demonstrating symmetric enhancement. No nephrolithiasis, hydronephrosis or solid renal masses. The unopacified ureters are normal in course and caliber. Delayed imaging through the kidneys demonstrates symmetric prompt contrast excretion within the proximal urinary collecting system. Urinary bladder is partially distended and unremarkable. Normal adrenal glands. STOMACH/BOWEL: Dilated small bowel to 3.9 cm with transition point mid abdomen associated with swirled mesenteries best appreciated on coronal imaging. No pneumatosis. Ligament of Treitz is to the LEFT. Rectosigmoid bowel anastomosis. Moderate amount of retained large bowel stool. VASCULAR/LYMPHATIC: Aortoiliac vessels are normal in caliber, tortuous course. Mild calcific atherosclerosis. No lymphadenopathy by CT size criteria. REPRODUCTIVE: Status post hysterectomy. OTHER: Small amount of free fluid in the pelvis. No intraperitoneal free air. No drainable fluid collection. MUSCULOSKELETAL: Nonacute. Thoracolumbar levoscoliosis with severe degenerative change of the lumbar spine. IMPRESSION: 1. High-grade small bowel obstruction with mid abdominal transition point secondary to midgut volvulus, less likely internal hernia. No bowel necrosis. 2. Prominent portal vein, likely secondary to probable volvulus. 3. Moderate retained large bowel stool. Rectosigmoid bowel anastomosis. 4. Acute findings discussed with and reconfirmed by Dr.ALLISON WEBSTER on 11/13/2016 at 6:53 am. Aortic Atherosclerosis (ICD10-I70.0). Electronically Signed   By: Awilda Metroourtnay  Bloomer M.D.   On: 11/13/2016 06:55    Anti-infectives: Anti-infectives    None      Assessment/Plan:  KUB is personally reviewed. Report is pending but this appears to be a completely resolved volvulus. No sign of bowel obstruction nonobstructive  pattern.  Will discontinue the nasogastric tube and start clear  liquids this morning. Possibly discharge later today.  Lattie Haw, MD, FACS  11/14/2016

## 2016-11-14 NOTE — Progress Notes (Signed)
Patient is tolerating a clear liquid diet. Will likely be able to be discharged later today. Will reexamine.

## 2016-11-14 NOTE — Progress Notes (Signed)
Patient discharged to home, discharge instructions and follow up appointments given. Patient instructed to call on Tuesday and make follow up appointment with Dr Excell Seltzerooper since the office is closed for the holiday. Patient is alert and oriented ambulates well without assistance and is tolerating diet and has had a couple of soft stools this afternoon. Patient denies pain at this time.

## 2016-11-18 ENCOUNTER — Telehealth: Payer: Self-pay

## 2016-11-18 ENCOUNTER — Other Ambulatory Visit: Payer: Self-pay

## 2016-11-18 NOTE — Telephone Encounter (Signed)
Hospital Follow-up call made to patient at this time. Spoke with Diana Owen. Hospital Follow-up interview questions below.  1. How are you feeling? Feeling better  2. Is your pain controlled? Not having any  3. What are you doing for the pain? None  4. Are you having any Nausea or Vomiting? None  5. Are you having any Fever or Chills? None  6. Are you having any Constipation or Diarrhea? Had bowel movement yesterday  7. Do you have any questions or concerns at this time? None   Discussion: Scheduled patient to be seen back in office on 9/6 with Dr. Copper at 9:45. Patient verbalized understanding at this time.

## 2016-11-18 NOTE — Telephone Encounter (Signed)
Hospital Follow-up call made to patient at this time. Left a message for patient to call back to let us know how she is feeling and to also get her a 6 day follow up appointment.

## 2016-11-20 ENCOUNTER — Encounter: Payer: Self-pay | Admitting: Surgery

## 2016-11-20 ENCOUNTER — Ambulatory Visit (INDEPENDENT_AMBULATORY_CARE_PROVIDER_SITE_OTHER): Payer: Medicare Other | Admitting: Surgery

## 2016-11-20 VITALS — BP 103/66 | HR 71 | Temp 97.7°F | Ht 62.0 in | Wt 108.0 lb

## 2016-11-20 DIAGNOSIS — K222 Esophageal obstruction: Secondary | ICD-10-CM | POA: Diagnosis not present

## 2016-11-20 DIAGNOSIS — K562 Volvulus: Secondary | ICD-10-CM

## 2016-11-20 NOTE — Progress Notes (Signed)
Outpatient Surgical Follow Up  11/20/2016  Diana Owen is an 82 y.o. female.   CC: Small bowel volvulus  HPI: This patient with a history of small bowel volvulus that spontaneously resolved. She had had this happen to half years ago at [facility] where she previously lived and it spontaneously resolved. With that in mind she was admitted to the hospital and observed with nasogastric suction and she spontaneously resolved and a prompt fashion and did not require any surgery. She is here for follow-up.  Patient has not been constipated and has had normal bowel movements. She's had some sensation of "sticking" of food in her upper abdomen or chest. She's had this happened multiple times before and in fact has had esophageal dilatations in the past.  She denies fevers or chills has had no further nausea or vomiting and absolutely no abdominal pain.  Of note the patient recently moved here and has just recently established with a new primary care physician. She does not have a GI physician.  Past Medical History:  Diagnosis Date  . GERD (gastroesophageal reflux disease)   . Intractable vomiting with nausea   . Midgut volvulus   . SBO (small bowel obstruction) (HCC) 11/13/2016  . Small bowel obstruction (HCC)   . Thyroid disease     Past Surgical History:  Procedure Laterality Date  . APPENDECTOMY    . PARTIAL COLECTOMY    . TONSILLECTOMY    . TUBAL LIGATION      Family History  Problem Relation Age of Onset  . Leukemia Mother     Social History:  reports that she has never smoked. She has never used smokeless tobacco. She reports that she does not drink alcohol. Her drug history is not on file.  Allergies:  Allergies  Allergen Reactions  . Codeine Nausea And Vomiting    Medications reviewed.   Review of Systems:   Review of Systems  Constitutional: Negative.   HENT: Negative.   Eyes: Negative.   Respiratory: Negative.   Cardiovascular: Negative.    Gastrointestinal: Negative for abdominal pain, diarrhea, heartburn, nausea and vomiting.  Genitourinary: Negative.   Musculoskeletal: Negative.   Skin: Negative.   Neurological: Negative.   Endo/Heme/Allergies: Negative.   Psychiatric/Behavioral: Negative.      Physical Exam:  There were no vitals taken for this visit.  Physical Exam  Constitutional: She is oriented to person, place, and time and well-developed, well-nourished, and in no distress. No distress.  HENT:  Head: Normocephalic and atraumatic.  Eyes: Pupils are equal, round, and reactive to light. Right eye exhibits no discharge. Left eye exhibits no discharge. No scleral icterus.  Neck: Normal range of motion.  Cardiovascular: Normal rate and normal heart sounds.   Pulmonary/Chest: Effort normal. No respiratory distress. She has no wheezes. She has no rales.  Abdominal: Soft. She exhibits no distension. There is no tenderness. There is no rebound and no guarding.  Musculoskeletal: Normal range of motion. She exhibits no edema or tenderness.  Lymphadenopathy:    She has no cervical adenopathy.  Neurological: She is alert and oriented to person, place, and time.  Skin: Skin is warm and dry. No rash noted. She is not diaphoretic. No erythema.  Psychiatric: Mood and affect normal.  Vitals reviewed.     No results found for this or any previous visit (from the past 48 hour(s)). No results found.  Assessment/Plan:  This a patient with a history of small bowel volvulus. It apparently has now happened  twice. Both times it spontaneously resolved. These episodes were 2-1/2 years apart.  Studies from the hospitalization were reviewed. Notes from the hospitalization were reviewed as well.  Currently the patient has no symptoms other than symptoms unrelated to her small bowel volvulus to include probable esophageal web or stricture. She has had prior dilatations. I will arrange for her to see a GI physician so that she can  become established if she needs additional esophageal dilatation or EGD. She is seeing a new primary care physician in the near future. She does not need to follow-up in surgery but is counseled concerning small meals as opposed to single large meals as well as following up in the emergency room should she experience this sort of episode again. Family was present and this was all reviewed with them as well.  Lattie Hawichard E Cooper, MD, FACS

## 2016-11-20 NOTE — Patient Instructions (Signed)
We have sent over a referral to [facility]. They will contact you with an appointment, however if you have not heard from them within the next 24-48 hours please give our office a call.  Listed below are some suggests for small meal plans:  Serving Sizes A serving size is a measured amount of food or drink, such as one slice of bread, that has an associated nutrient content. Knowing the serving size of a food or drink can help you determine how much of that food you should consume. What is the size of one serving? The size of one healthy serving depends on the food or drink. To determine a serving size, read the food label. If the food or drink does not have a food label, try to find serving size information online. Or, use the following to estimate the size of one adult serving: Grain 1 slice bread.  bagel.  cup pasta. Vegetable  cup cooked or canned vegetables. 1 cup raw, leafy greens. Fruit  cup canned fruit. 1 medium fruit.  cup dried fruit. Meat and Other Protein Sources 1 oz meat, poultry, or fish.  cup cooked beans. 1 egg.  cup nuts or seeds. 1 Tbsp nut butter.  cup tofu or tempeh. 2 Tbsp hummus. Dairy An individual container of yogurt (6-8 oz). 1 piece of cheese the size of your thumb (1 oz). 1 cup (8 oz) milk or milk alternative. Fat A piece the size of one dice. 1 tsp soft margarine. 1 Tbsp mayonnaise. 1 tsp vegetable oil. 1 Tbsp regular salad dressing. 2 Tbsp low-fat salad dressing. How many servings should I eat from each food group each day? The following are the suggested number of servings to try and have every day from each food group. You can also look at your eating throughout the week and aim for meeting these requirements on most days for overall healthy eating. Grain 6-8 servings. Try to have half of your grains from whole grains, such as whole wheat bread, corn tortillas, oatmeal, brown rice, whole wheat pasta, and bulgur. Vegetable At least 2-3  servings. Fruit 2 servings. Meat and Other Protein Foods 5-6 servings. Aim to have lean proteins, such as chicken, [place], fish, beans, or tofu. Dairy 3 servings. Choose low-fat or nonfat if you are trying to control your weight. Fat 2-3 servings. Is a serving the same thing as a portion? No. A portion is the actual amount you eat, which may be more than one serving. Knowing the specific serving size of a food and the nutritional information that goes with it can help you make a healthy decision on what size portion to eat. What are some tips to help me learn healthy serving sizes?  Check food labels for serving sizes. Many foods that come as a single portion actually contain multiple servings.  Determine the serving size of foods you commonly eat and figure out how large a portion you usually eat.  Measure the number of servings that can be held by the bowls, glasses, cups, and plates you typically use. For example, pour your breakfast cereal into your regular bowl and then pour it into a measuring cup.  For 1-2 days, measure the serving sizes of all the foods you eat.  Practice estimating serving sizes and determining how big your portions should be. This information is not intended to replace advice given to you by your health care provider. Make sure you discuss any questions you have with your health care provider. Document  Released: 11/30/2002 Document Revised: 10/27/2015 Document Reviewed: 05/31/2013 Elsevier Interactive Patient Education  [employer]2018 Elsevier Inc.

## 2016-11-21 ENCOUNTER — Ambulatory Visit (INDEPENDENT_AMBULATORY_CARE_PROVIDER_SITE_OTHER): Payer: Medicare Other | Admitting: Internal Medicine

## 2016-11-21 ENCOUNTER — Encounter: Payer: Self-pay | Admitting: Internal Medicine

## 2016-11-21 VITALS — BP 116/64 | HR 71 | Temp 97.9°F | Ht 61.5 in | Wt 106.2 lb

## 2016-11-21 DIAGNOSIS — E441 Mild protein-calorie malnutrition: Secondary | ICD-10-CM | POA: Insufficient documentation

## 2016-11-21 DIAGNOSIS — E2839 Other primary ovarian failure: Secondary | ICD-10-CM

## 2016-11-21 DIAGNOSIS — E039 Hypothyroidism, unspecified: Secondary | ICD-10-CM | POA: Diagnosis not present

## 2016-11-21 DIAGNOSIS — Q433 Congenital malformations of intestinal fixation: Secondary | ICD-10-CM

## 2016-11-21 DIAGNOSIS — E785 Hyperlipidemia, unspecified: Secondary | ICD-10-CM

## 2016-11-21 DIAGNOSIS — K2100 Gastro-esophageal reflux disease with esophagitis, without bleeding: Secondary | ICD-10-CM

## 2016-11-21 DIAGNOSIS — M4125 Other idiopathic scoliosis, thoracolumbar region: Secondary | ICD-10-CM

## 2016-11-21 DIAGNOSIS — Z23 Encounter for immunization: Secondary | ICD-10-CM

## 2016-11-21 LAB — TSH: TSH: 0.36 u[IU]/mL (ref 0.35–4.50)

## 2016-11-21 LAB — T4, FREE: Free T4: 1.45 ng/dL (ref 0.60–1.60)

## 2016-11-21 NOTE — Assessment & Plan Note (Signed)
Has arm weakness also Discussed need for resistance training, etc Will check DEXA as high risk and start bisphosphonate if osteoporotic

## 2016-11-21 NOTE — Assessment & Plan Note (Signed)
Recurrent SBO but now better Bowels are okay with miralax

## 2016-11-21 NOTE — Assessment & Plan Note (Signed)
Off statin therapy Known high HDL--I agree with limited likelihood of benefit

## 2016-11-21 NOTE — Progress Notes (Signed)
Subjective:    Patient ID: Diana Owen, female    DOB: 1934/12/26, 82 y.o.   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 albuterol (PROVENTIL HFA;VENTOLIN HFA) 108 (90 Base) MCG/ACT inhaler Inhale into the lungs.    Marland Kitchen. levothyroxine (SYNTHROID, LEVOTHROID) 100 MCG tablet Take 100 mcg by mouth daily.    Marland Kitchen. Spacer/Aero-Holding Chambers (BREATHERITE) MISC Inhale into the lungs.     No current facility-administered medications on file prior to visit.     Allergies  Allergen Reactions  . Codeine Nausea And Vomiting  . Latex Other (See Comments)    Past Medical History:  Diagnosis Date  . GERD (gastroesophageal reflux disease)   . Intractable vomiting with nausea   . Midgut volvulus   . SBO (small bowel obstruction) (HCC) 11/13/2016  . Small bowel obstruction (HCC)   . Thyroid disease     Past Surgical History:  Procedure Laterality Date  . APPENDECTOMY    . PARTIAL COLECTOMY     large polyp  . TONSILLECTOMY    . TUBAL LIGATION      Family History  Problem Relation Age of Onset  . Leukemia Mother        CLL  . Leukemia Father   . Hyperlipidemia Brother   . Stroke Brother   . Heart disease Paternal Uncle   . Diabetes Neg Hx     Social History    Social History  . Marital status: Married    Spouse name: N/A  . Number of children: 3  . Years of education: N/A   Occupational History  . [employer] Services operational officerecretary   . 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 Smoking status: Never Smoker  . Smokeless tobacco: Never Used  . Alcohol use Yes     Comment: rare wine   . Drug use: Unknown  . Sexual activity: Not on file   Other Topics Concern  . Not on file   Social History Narrative   2 sons, 1 daughter--- [name] in [place]   [name] in [place], [name] in [place]      Has a living will   Daughter Darel HongJudy, then sons, should make health care decisions   Would accept resuscitation attempts   Not sure about tube feeds   Review of Systems  Constitutional:       Lost weight after recent hospital stay Wears seat belt  HENT: Negative for hearing loss and tinnitus.   Eyes: Negative for visual disturbance.       No diplopia or unilateral vision  Respiratory: Negative for choking, chest tightness and shortness of breath.   Cardiovascular: Negative for chest pain, palpitations and leg swelling.  Gastrointestinal: Negative for abdominal pain and blood  in stool.  Endocrine: Negative for polydipsia and polyuria.  Genitourinary: Negative for difficulty urinating, dysuria and hematuria.       Wears pad for occasional leakage. Still gets some hot flashes  Musculoskeletal: Negative for back pain and joint swelling.       Arm weakness Slight arthritic pain  Skin: Negative for rash.       No suspicious lesions  Allergic/Immunologic: Negative for environmental allergies and immunocompromised state.  Neurological: Negative for dizziness, syncope and light-headedness.  Hematological: Negative for adenopathy. Does not bruise/bleed easily.  Psychiatric/Behavioral: Negative for dysphoric mood and sleep disturbance.       Stress with recent move Chronic low level anxiety Panic at times--like on big highways (was on tranquilizers in past)        Objective:   Physical Exam  Constitutional: No distress.  HENT:  Mouth/Throat: Oropharynx is clear and moist. No oropharyngeal exudate.  Neck: No thyromegaly present.  Cardiovascular: Normal rate, regular rhythm, normal heart sounds and intact distal pulses.  Exam reveals no gallop.   No murmur heard. Pulmonary/Chest: Effort normal and breath sounds normal. No respiratory distress. She has no wheezes. She has no rales.  Abdominal: Soft. There is no tenderness.  Musculoskeletal: She exhibits no edema or tenderness.  Scoliosis concave to right  Lymphadenopathy:    She has no cervical adenopathy.  Skin: No rash noted. No erythema.  Psychiatric: She has a normal mood and affect. Her behavior is normal.          Assessment & Plan:

## 2016-11-21 NOTE — Assessment & Plan Note (Signed)
Low TSH in May Will recheck with free T4 and decrease dose if high

## 2016-11-21 NOTE — Assessment & Plan Note (Signed)
Slight swallowing issues but no regular symptoms If worsens, can take omeprazole sporadically

## 2016-11-21 NOTE — Assessment & Plan Note (Signed)
Lost weight with SBO Discussed using ensure, etc to gain back to her baseline

## 2016-11-24 ENCOUNTER — Encounter: Payer: Self-pay | Admitting: *Deleted

## 2016-12-19 ENCOUNTER — Telehealth: Payer: Self-pay

## 2016-12-19 ENCOUNTER — Other Ambulatory Visit: Payer: Self-pay

## 2016-12-19 ENCOUNTER — Ambulatory Visit: Payer: Medicare Other | Admitting: Gastroenterology

## 2016-12-19 ENCOUNTER — Encounter (INDEPENDENT_AMBULATORY_CARE_PROVIDER_SITE_OTHER): Payer: Self-pay

## 2016-12-19 ENCOUNTER — Ambulatory Visit (INDEPENDENT_AMBULATORY_CARE_PROVIDER_SITE_OTHER): Payer: Medicare Other | Admitting: Gastroenterology

## 2016-12-19 VITALS — BP 123/73 | HR 69 | Temp 98.4°F | Wt 109.0 lb

## 2016-12-19 DIAGNOSIS — R1319 Other dysphagia: Secondary | ICD-10-CM

## 2016-12-19 DIAGNOSIS — R1310 Dysphagia, unspecified: Secondary | ICD-10-CM | POA: Diagnosis not present

## 2016-12-19 NOTE — Telephone Encounter (Signed)
Left patient a message informing her of appt scheduled for Barium Swallow on 12/29/16 arrival time at 8:45am [facility]-instructed nothing to eat or drink after midnight. Thanks [employer]

## 2016-12-19 NOTE — Progress Notes (Signed)
Diana Repress, MD [address]  [address]  [place], [place] [address]  Main: [phone]  Fax: [phone]    Gastroenterology Consultation  Referring Provider:     Lattie Haw, MD Primary Care Physician:  System, Pcp Not In Primary Gastroenterologist:  Dr. Arlyss Owen Reason for Consultation:     Chronic dysphagia        HPI:   Diana Owen is a 82 y.o. y/o female referred by Dr. Cynda Owen, Pcp Not In  for consultation & management of Chronic dysphagia. 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 She had a small bowel volvulus in 06/2013 as well. Imaging from 07/2014 also reveals significant stool burden and partial SBO. Recent imaging also revealed significant stool burden in the colon. Since then, patient has been taking MiraLAX regularly to keep her bowel movements going 1-2 times daily. She denies having abdominal pain, nausea, vomiting at present.  She she reports having history of dysphagia several years ago, she underwent EGD with dilation at [facility] at the time. She reports having had dilations twice and had symptomatic improvement which lasted for about 6 months. But, for the last 6 months or so she has been experiencing difficulty swallowing to solids. This almost occurs with most of the meals associated with some regurgitation. She had a barium esophagogram on 09/05/2015 which revealed small esophageal diverticulum.  She recently moved to [place] area, currently staying with her husband in a temporary housing. She is moving to [place] in 1-2 weeks and is stressed out.  GI Procedures: Colonoscopy 10/2014 at [facility] and was reportedly normal  Past Medical History:  Diagnosis Date  . GERD (gastroesophageal reflux disease)   . Intractable vomiting with  nausea   . Midgut volvulus   . SBO (small bowel obstruction) (HCC) 11/13/2016  . Small bowel obstruction (HCC)   . Thyroid disease     Past Surgical History:  Procedure Laterality Date  . APPENDECTOMY    . PARTIAL COLECTOMY     large polyp  . TONSILLECTOMY    . TUBAL LIGATION      Prior to Admission medications   Medication Sig Start Date End Date Taking? Authorizing Provider  albuterol (PROVENTIL HFA;VENTOLIN HFA) 108 (90 Base) MCG/ACT inhaler Inhale into the lungs. 07/03/16 07/03/17 Yes [provider]  cholecalciferol (VITAMIN D) 1000 units tablet Take 1,000 Units by mouth daily.   Yes [provider]  Cyanocobalamin (VITAMIN B-12) 6000 MCG SUBL Place 1 tablet under the tongue daily.   Yes [provider]  levothyroxine (SYNTHROID, LEVOTHROID) 100 MCG tablet Take 100 mcg by mouth daily. 04/23/16  Yes [provider]  Multiple Vitamins-Minerals (MULTIVITAMIN WITH MINERALS) tablet Take 1 tablet by mouth daily.   Yes [provider]  omeprazole (PRILOSEC) 20 MG capsule Take by mouth.   Yes [provider]  polyethylene glycol (MIRALAX / GLYCOLAX) packet Take 17 g by mouth daily.   Yes [provider]  Spacer/Aero-Holding Chambers (BREATHERITE) MISC Inhale into the lungs. 07/03/16  Yes [provider]    Family History  Problem Relation Age of Onset  . Leukemia Mother        CLL  . Leukemia Father   . Hyperlipidemia Brother   . Stroke Brother   . Heart disease Paternal Uncle   . Diabetes  Neg Hx      Social History  Substance Use Topics  . Smoking status: Never Smoker  . Smokeless tobacco: Never Used  . Alcohol use Yes     Comment: rare wine     Allergies as of 12/19/2016 - Review Complete 12/19/2016  Allergen Reaction Noted  . Codeine Nausea And Vomiting 11/12/2016  . Latex Other (See Comments) 11/21/2016    Review of Systems:    All systems reviewed and negative except where noted in HPI.    Physical Exam:  BP 123/73   Pulse 69   Temp 98.4 F (36.9 C) (Oral)   Wt 109 lb (49.4 kg)   BMI 20.26 kg/m  No LMP recorded. Patient has had a hysterectomy.  General:   Alert,  Well-developed, well-nourished, pleasant and cooperative in NAD Head:  Normocephalic and atraumatic. Eyes:  Sclera clear, no icterus.   Conjunctiva pink. Ears:  Normal auditory acuity. Nose:  No deformity, discharge, or lesions. Mouth:  No deformity or lesions,oropharynx pink & moist. Neck:  Supple; no masses or thyromegaly. Lungs:  Respirations even and unlabored.  Clear throughout to auscultation.   No wheezes, crackles, or rhonchi. No acute distress. Heart:  Regular rate and rhythm; no murmurs, clicks, rubs, or gallops. Abdomen:  Normal bowel sounds.  No bruits.  Soft, non-tender and non-distended without masses, hepatosplenomegaly or hernias noted.  No guarding or rebound tenderness.   Rectal: Nor performed Msk:  Symmetrical without gross deformities. Good, equal movement & strength bilaterally. Pulses:  Normal pulses noted. Extremities:  No clubbing or edema.  No cyanosis. Neurologic:  Alert and oriented x3;  grossly normal neurologically. Skin:  Intact without significant lesions or rashes. No jaundice. Lymph Nodes:  No significant cervical adenopathy. Psych:  Alert and cooperative. Normal mood and affect.  Imaging Studies: Reviewed from care everywhere  Assessment and Plan:   Diana Owen is a 82 y.o. 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 She did lose some weight, currently stabilized as she has been making sure to incorporate protein in her diet. She is also drinking Ensure daily. I discussed with her about EGD with dilation and patient deferred it as she is worried about memory loss related to medications given during anesthesia. She felt that  she is able to manage her weight by eating high-protein foods and ensure. I recommended getting a barium esophagogram and depending on that we can discuss about EGD. She agreed with the plan. However, she wants to get esophagogram after she moves to [place]. I suggested her to increase Ensure to 2 times daily. Continue omeprazole daily  Follow up in 4 weeks after the x-ray esophagus   Diana Repress, MD

## 2016-12-29 ENCOUNTER — Ambulatory Visit: Payer: Medicare Other | Attending: Gastroenterology

## 2017-04-25 ENCOUNTER — Emergency Department: Payer: Medicare Other

## 2017-04-25 ENCOUNTER — Inpatient Hospital Stay
Admission: EM | Admit: 2017-04-25 | Discharge: 2017-04-28 | DRG: 390 | Disposition: A | Discharge status: Home / Self Care | Payer: Medicare Other | Attending: Surgery | Admitting: Surgery

## 2017-04-25 ENCOUNTER — Encounter: Payer: Self-pay | Admitting: Emergency Medicine

## 2017-04-25 ENCOUNTER — Other Ambulatory Visit: Payer: Self-pay

## 2017-04-25 DIAGNOSIS — K56609 Unspecified intestinal obstruction, unspecified as to partial versus complete obstruction: Secondary | ICD-10-CM | POA: Diagnosis not present

## 2017-04-25 DIAGNOSIS — R1111 Vomiting without nausea: Secondary | ICD-10-CM | POA: Diagnosis not present

## 2017-04-25 DIAGNOSIS — Z9049 Acquired absence of other specified parts of digestive tract: Secondary | ICD-10-CM | POA: Diagnosis not present

## 2017-04-25 DIAGNOSIS — Z79890 Hormone replacement therapy (postmenopausal): Secondary | ICD-10-CM | POA: Diagnosis not present

## 2017-04-25 DIAGNOSIS — K219 Gastro-esophageal reflux disease without esophagitis: Secondary | ICD-10-CM | POA: Diagnosis present

## 2017-04-25 DIAGNOSIS — Z885 Allergy status to narcotic agent status: Secondary | ICD-10-CM

## 2017-04-25 DIAGNOSIS — E079 Disorder of thyroid, unspecified: Secondary | ICD-10-CM | POA: Diagnosis present

## 2017-04-25 DIAGNOSIS — R0981 Nasal congestion: Secondary | ICD-10-CM | POA: Diagnosis present

## 2017-04-25 DIAGNOSIS — Z79899 Other long term (current) drug therapy: Secondary | ICD-10-CM | POA: Diagnosis not present

## 2017-04-25 DIAGNOSIS — K565 Intestinal adhesions [bands], unspecified as to partial versus complete obstruction: Secondary | ICD-10-CM | POA: Diagnosis not present

## 2017-04-25 DIAGNOSIS — Z91040 Latex allergy status: Secondary | ICD-10-CM

## 2017-04-25 DIAGNOSIS — K5652 Intestinal adhesions [bands] with complete obstruction: Secondary | ICD-10-CM

## 2017-04-25 DIAGNOSIS — Z4682 Encounter for fitting and adjustment of non-vascular catheter: Secondary | ICD-10-CM | POA: Diagnosis not present

## 2017-04-25 DIAGNOSIS — K56699 Other intestinal obstruction unspecified as to partial versus complete obstruction: Secondary | ICD-10-CM | POA: Diagnosis not present

## 2017-04-25 DIAGNOSIS — R1010 Upper abdominal pain, unspecified: Secondary | ICD-10-CM | POA: Diagnosis not present

## 2017-04-25 HISTORY — DX: Intestinal adhesions (bands) with complete obstruction: K56.52

## 2017-04-25 LAB — URINALYSIS, COMPLETE (UACMP) WITH MICROSCOPIC
BACTERIA UA: NONE SEEN
BILIRUBIN URINE: NEGATIVE
Glucose, UA: NEGATIVE mg/dL
HGB URINE DIPSTICK: NEGATIVE
KETONES UR: NEGATIVE mg/dL
LEUKOCYTES UA: NEGATIVE
NITRITE: NEGATIVE
PROTEIN: NEGATIVE mg/dL
Specific Gravity, Urine: >1.046 — ABNORMAL HIGH (ref 1.005–1.030)
WBC UA: NONE SEEN WBC/hpf (ref 0–5)
pH: 8 (ref 5.0–8.0)

## 2017-04-25 LAB — COMPREHENSIVE METABOLIC PANEL
ALBUMIN: 4.4 g/dL (ref 3.5–5.0)
ALT: 22 U/L (ref 14–54)
ANION GAP: 14 (ref 5–15)
AST: 37 U/L (ref 15–41)
Alkaline Phosphatase: 67 U/L (ref 38–126)
BILIRUBIN TOTAL: 1.2 mg/dL (ref 0.3–1.2)
BUN: 19 mg/dL (ref 6–20)
CHLORIDE: 99 mmol/L — AB (ref 101–111)
CO2: 25 mmol/L (ref 22–32)
Calcium: 9.7 mg/dL (ref 8.9–10.3)
Creatinine, Ser: 0.84 mg/dL (ref 0.44–1.00)
GFR calc Af Amer: >60 mL/min (ref >60)
GLUCOSE: 207 mg/dL — AB (ref 65–99)
POTASSIUM: 3.5 mmol/L (ref 3.5–5.1)
Sodium: 138 mmol/L (ref 135–145)
TOTAL PROTEIN: 7.1 g/dL (ref 6.5–8.1)

## 2017-04-25 LAB — CBC
HEMATOCRIT: 40.4 % (ref 35.0–47.0)
HEMOGLOBIN: 13.6 g/dL (ref 12.0–16.0)
MCH: 29.9 pg (ref 26.0–34.0)
MCHC: 33.7 g/dL (ref 32.0–36.0)
MCV: 88.5 fL (ref 80.0–100.0)
Platelets: 216 10*3/uL (ref 150–440)
RBC: 4.57 MIL/uL (ref 3.80–5.20)
RDW: 13.5 % (ref 11.5–14.5)
WBC: 14 10*3/uL — AB (ref 3.6–11.0)

## 2017-04-25 LAB — LIPASE, BLOOD: LIPASE: 28 U/L (ref 11–51)

## 2017-04-25 MED ORDER — IOPAMIDOL (ISOVUE-300) INJECTION 61%
100.0000 mL | Freq: Once | INTRAVENOUS | Status: AC | PRN
  Administered 2017-04-25: 100 mL via INTRAVENOUS

## 2017-04-25 MED ORDER — SODIUM CHLORIDE 0.9 % IV BOLUS (SEPSIS)
500.0000 mL | INTRAVENOUS | Status: AC
  Administered 2017-04-25: 500 mL via INTRAVENOUS

## 2017-04-25 MED ORDER — ONDANSETRON HCL 4 MG/2ML IJ SOLN
4.0000 mg | INTRAMUSCULAR | Status: AC
  Administered 2017-04-25: 4 mg via INTRAVENOUS

## 2017-04-25 MED ORDER — SODIUM CHLORIDE 0.9 % IV SOLN
Freq: Once | INTRAVENOUS | Status: AC
  Administered 2017-04-25: 09:00:00 via INTRAVENOUS

## 2017-04-25 MED ORDER — ONDANSETRON HCL 4 MG/2ML IJ SOLN
4.0000 mg | Freq: Once | INTRAMUSCULAR | Status: DC | PRN
  Filled 2017-04-25: qty 2

## 2017-04-25 MED ORDER — ONDANSETRON HCL 4 MG/2ML IJ SOLN
4.0000 mg | INTRAMUSCULAR | Status: AC
  Administered 2017-04-25: 4 mg via INTRAVENOUS
  Filled 2017-04-25: qty 2

## 2017-04-25 MED ORDER — KCL IN DEXTROSE-NACL 20-5-0.45 MEQ/L-%-% IV SOLN
INTRAVENOUS | Status: DC
  Administered 2017-04-25 – 2017-04-28 (×6): via INTRAVENOUS
  Filled 2017-04-25 (×9): qty 1000

## 2017-04-25 MED ORDER — PANTOPRAZOLE SODIUM 40 MG IV SOLR
40.0000 mg | Freq: Every day | INTRAVENOUS | Status: DC
  Administered 2017-04-25 – 2017-04-27 (×3): 40 mg via INTRAVENOUS
  Filled 2017-04-25 (×3): qty 40

## 2017-04-25 MED ORDER — ALBUTEROL SULFATE (2.5 MG/3ML) 0.083% IN NEBU: 3.0000 mL | INHALATION_SOLUTION | RESPIRATORY_TRACT | Status: DC | PRN

## 2017-04-25 MED ORDER — ENOXAPARIN SODIUM 40 MG/0.4ML ~~LOC~~ SOLN
40.0000 mg | SUBCUTANEOUS | Status: DC
  Administered 2017-04-25 – 2017-04-28 (×4): 40 mg via SUBCUTANEOUS
  Filled 2017-04-25 (×4): qty 0.4

## 2017-04-25 MED ORDER — PHENOL 1.4 % MT LIQD
1.0000 | OROMUCOSAL | Status: DC | PRN
  Administered 2017-04-25: 1 via OROMUCOSAL
  Filled 2017-04-25: qty 177

## 2017-04-25 NOTE — ED Notes (Signed)
Pain started last night and hx of SBO.

## 2017-04-25 NOTE — H&P (Signed)
SURGICAL HISTORY & PHYSICAL (cpt [phone])  HISTORY OF PRESENT ILLNESS (HPI):  83 y.o. female presented to [facility] ED this morning for abdominal pain and "bloating" with nausea that began after lunch yesterday. Patient reports the pain and nausea were both mild until worsened after dinner, after which she began vomiting non-bloody emesis x 6 - 7 episodes. She and her daughter (at bedside) describe this is patient's third similar episode, most recently this past August, at which time patient presented to [facility], was admitted to surgical service, and SBO resolved with non-operative nasogastric decompression. She states her pain and nausea have completely resolved following insertion of NG tube, though no flatus since yesterday morning and she still feels mildly bloated. She denies fever/chills, CP, or SOB.   PAST MEDICAL HISTORY (PMH):  Past Medical History:  Diagnosis Date  . GERD (gastroesophageal reflux disease)   . Intractable vomiting with nausea   . Midgut volvulus   . SBO (small bowel obstruction) (HCC) 11/13/2016  . Small bowel obstruction (HCC)   . Thyroid disease     Reviewed. Otherwise negative.   PAST SURGICAL HISTORY (PSH):  Past Surgical History:  Procedure Laterality Date  . APPENDECTOMY    . PARTIAL COLECTOMY     large polyp  . TONSILLECTOMY    . TUBAL LIGATION      Reviewed. Otherwise negative.   MEDICATIONS:  Prior to Admission medications   Medication Sig Start Date End Date Taking? Authorizing Provider  albuterol (PROVENTIL HFA;VENTOLIN HFA) 108 (90 Base) MCG/ACT inhaler Inhale 2 puffs into the lungs every 4 (four) hours as needed for wheezing or shortness of breath.  07/03/16 07/03/17 Yes [provider]  cholecalciferol (VITAMIN D) 1000 units tablet Take 1,000 Units by mouth daily.   Yes [provider]  Cyanocobalamin (VITAMIN B-12) 6000 MCG SUBL Place 1 tablet under the tongue daily.   Yes [provider]  levothyroxine (SYNTHROID,  LEVOTHROID) 100 MCG tablet Take 100 mcg by mouth daily. 04/23/16  Yes [provider]  Multiple Vitamins-Minerals (MULTIVITAMIN WITH MINERALS) tablet Take 1 tablet by mouth daily.   Yes [provider]  omeprazole (PRILOSEC) 20 MG capsule Take 20 mg by mouth daily as needed (reflux).    Yes [provider]     ALLERGIES:  Allergies  Allergen Reactions  . Codeine Nausea And Vomiting  . Latex Rash     SOCIAL HISTORY:  Social History   Socioeconomic History  . Marital status: Married    Spouse name: Not on file  . Number of children: 3  . Years of education: Not on file  . Highest education level: Not on file  Social Needs  . Financial resource strain: Not on file  . Food insecurity - worry: Not on file  . Food insecurity - inability: Not on file  . Transportation needs - medical: Not on file  . Transportation needs - non-medical: Not on file  Occupational History  . Occupation: Diplomatic Services operational officer  . 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 Smoking status: Never Smoker  . Smokeless tobacco: Never Used  Substance and Sexual Activity  . Alcohol use: Yes    Comment: rare wine   . Drug use: Not on file  . Sexual activity: Not on file  Other Topics Concern  . Not on file  Social History Narrative   2 sons, 1 daughter--- Brett Canales in [place] in [place], Selinda Orion in [place]  Has a living will   Daughter [name], then sons, should make health care decisions   Would accept resuscitation attempts   Not sure about tube feeds    The patient currently resides (home / rehab facility / nursing home): Home The patient normally is (ambulatory / bedbound): Ambulatory  FAMILY HISTORY:  Family History  Problem Relation Age of Onset  . Leukemia Mother        CLL  . Leukemia Father   . Hyperlipidemia Brother   . Stroke Brother   . Heart disease Paternal Uncle   . Diabetes Neg Hx     Otherwise negative.   REVIEW OF SYSTEMS:   Constitutional: denies any other weight loss, fever, chills, or sweats  Eyes: denies any other vision changes, history of eye injury  ENT: denies sore throat, hearing problems  Respiratory: denies shortness of breath, wheezing  Cardiovascular: denies chest pain, palpitations  Gastrointestinal: abdominal pain, N/V, and bowel function as per HPI  Genitourinary: denies burning with urination or urinary frequency Musculoskeletal: denies any other joint pains or cramps  Skin: Denies any other rashes or skin discolorations  Neurological: denies any other headache, dizziness, weakness  Psychiatric: denies any other depression, anxiety   All other review of systems were otherwise negative.  VITAL SIGNS:  Temp:  [97.6 F (36.4 C)-98.5 F (36.9 C)] 98.5 F (36.9 C) (02/09 0919) Pulse Rate:  [59-79] 79 (02/09 0919) Resp:  [18] 18 (02/09 0919) BP: (109-137)/(60-73) 109/65 (02/09 0919) SpO2:  [95 %-98 %] 95 % (02/09 0919) Weight:  [110 lb (49.9 kg)] 110 lb (49.9 kg) (02/09 0519)     Height: 5\' 2"  (157.5 cm) Weight: 110 lb (49.9 kg) BMI (Calculated): 20.11   INTAKE/OUTPUT:  This shift: No intake/output data recorded.  Last 2 shifts: @IOLAST2SHIFTS @  PHYSICAL EXAM:  Constitutional:  -- Normal body habitus  -- Awake, alert, and oriented x3, no apparent distress Eyes:  -- Pupils equally round and reactive to light  -- No scleral icterus, B/L no occular discharge Ear, nose, throat: -- Neck is FROM WNL -- No jugular venous distension  Pulmonary:  -- No wheezes or rhales -- Equal breath sounds bilaterally -- Breathing non-labored at rest Cardiovascular:  -- S1, S2 present  -- No pericardial rubs  Gastrointestinal:  -- Abdomen soft, minimally tender, and mildly distended with 12 F NG tube well-secured, no guarding or rebound tenderness -- No abdominal masses appreciated, pulsatile or otherwise  Musculoskeletal and Integumentary:  -- Wounds or skin discoloration: None appreciated --  Extremities: B/L UE and LE FROM, hands and feet warm, no edema  Neurologic:  -- Motor function: Intact and symmetric -- Sensation: Intact and symmetric Psychiatric:  -- Mood and affect WNL  Labs:  CBC Latest Ref Rng & Units 04/25/2017 11/14/2016 11/12/2016  WBC 3.6 - 11.0 K/uL 14.0(H) 7.3 11.9(H)  Hemoglobin 12.0 - 16.0 g/dL 54.0 98.1 19.1  Hematocrit 35.0 - 47.0 % 40.4 38.3 43.2  Platelets 150 - 440 K/uL 216 183 251   CMP Latest Ref Rng & Units 04/25/2017 11/14/2016 11/12/2016  Glucose 65 - 99 mg/dL 478(G) 956(O) 130(Q)  BUN 6 - 20 mg/dL 19 7 15   Creatinine 0.44 - 1.00 mg/dL 6.57 8.46 9.62  Sodium 135 - 145 mmol/L 138 141 139  Potassium 3.5 - 5.1 mmol/L 3.5 3.8 3.7  Chloride 101 - 111 mmol/L 99(L) 106 98(L)  CO2 22 - 32 mmol/L 25 30 28   Calcium 8.9 - 10.3 mg/dL 9.7 8.3(L) 10.2  Total Protein  6.5 - 8.1 g/dL 7.1 - 7.3  Total Bilirubin 0.3 - 1.2 mg/dL 1.2 - 1.6(H)  Alkaline Phos 38 - 126 U/L 67 - 64  AST 15 - 41 U/L 37 - 33  ALT 14 - 54 U/L 22 - 24    Imaging studies:  CT Abdomen and Pelvis with Contrast (04/25/2017) - personally reviewed and discussed with patient and her daughter A high-grade small bowel obstruction is noted with dilated proximal mid small bowel loops. The transition point lies within the mid lower abdomen (series 2, image 37 and series 5, image 22) without obstructing cause identified. No definite bowel wall thickening is noted. There is no evidence of pneumoperitoneum or abscess. Surgical changes of the distal colon again noted.   Assessment/Plan: (ICD-10's: 19K56.52) 83 y.o. female with complete small bowel obstruction, likely attributable to post-surgical adhesions following partial colectomy for a polyp, appendectomy, and tubal ligation, complicated by pertinent comorbidities including thyroid disease (not otherwise specified), GERD, and 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              - NPO, IVF              - NG tube for nasogastric decompression             -  monitor ongoing bowel function and abdominal exam              - anticipate symptomatic relief within 24 - 48 hours following NGT insertion, followed by "rumbling" the following day and flatus either the same day or the day following the "rumbling" with anticipated length of stay ~3 - 5 days with successful non-operative management for 8 of 10 patients with small bowel obstruction secondary to adhesions  - surgical intervention if doesn't improve was also discussed             - medical management comorbidities (home meds)             - ambulation encouraged              - DVT prophylaxis  All of the above recommendations were discussed with the patient, her daughter, and ED physician, and all of patient's and her family's questions were answered to their expressed satisfaction.  -- Scherrie GerlachJason E. Earlene Plateravis, MD, RPVI [place]: [facility] - Partnering for exceptional care. Office: [phone]

## 2017-04-25 NOTE — ED Triage Notes (Signed)
Patient coming from [facility] for Emesis and some slight abd pain with pain starting at 9:30 last night and emesis at 11:30 and preceded to having one episode every hour. Denies any diarrhea.

## 2017-04-25 NOTE — ED Provider Notes (Signed)
[facility] Emergency Department Provider Note  ____________________________________________   First MD Initiated Contact with Patient 04/25/17 [phone]     (approximate)  I have reviewed the triage vital signs and the nursing notes.   HISTORY  Chief Complaint Emesis    HPI Diana Owen is a 83 y.o. 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  She reports that after eating lunch today her stomach started to have some mild pain all throughout the upper abdomen.  It was mild and nothing in particular made it better or worse.  She thought it would pass so she ate the same food again for dinner and the symptoms got worse.  Around 10 PM they became severe and she started to vomit.  She reports that she vomited every hour after that, at least 6 or 7 times before calling EMS.  She tried drinking a lot of warm water prior to the start of the vomiting, a method which is helped in the past, but all of the water came back up.  She reports now that the pain has gone away and after Zofran 4 mg IV the nausea has resolved as well.  She has a little bit of distention.  She has not had any bowel movement and she reports that she has not passed gas since the start of these episodes.  The pain had been severe and the vomiting was severe, but currently her symptoms have resolved except for some mild distention.  She denies fever/chills, chest pain, shortness of breath, dysuria, and diarrhea.    Past Medical History:  Diagnosis Date  . GERD (gastroesophageal reflux disease)   . Intractable vomiting with nausea   . Midgut volvulus   . SBO (small bowel obstruction) (HCC) 11/13/2016  . Small bowel obstruction (HCC)   . Thyroid disease     Patient Active Problem List   Diagnosis Date Noted  . Malnutrition of mild degree (HCC) 11/21/2016    . SBO (small bowel obstruction) (HCC) 11/13/2016  . Intractable vomiting with nausea   . Midgut volvulus   . Small bowel obstruction (HCC)   . Other idiopathic scoliosis, thoracolumbar region 07/16/2016  . Oral phase dysphagia 09/26/2015  . Acid reflux 09/04/2015  . Chronic abdominal pain 09/04/2015  . Hyperlipidemia 09/04/2015  . Hypothyroidism 09/04/2015    Past Surgical History:  Procedure Laterality Date  . APPENDECTOMY    . PARTIAL COLECTOMY     large polyp  . TONSILLECTOMY    . TUBAL LIGATION      Prior to Admission medications   Medication Sig Start Date End Date Taking? Authorizing Provider  albuterol (PROVENTIL HFA;VENTOLIN HFA) 108 (90 Base) MCG/ACT inhaler Inhale into the lungs. 07/03/16 07/03/17  [provider]  cholecalciferol (VITAMIN D) 1000 units tablet Take 1,000 Units by mouth daily.    [provider]  Cyanocobalamin (VITAMIN B-12) 6000 MCG SUBL Place 1 tablet under the tongue daily.    [provider]  levothyroxine (SYNTHROID, LEVOTHROID) 100 MCG tablet Take 100 mcg by mouth daily. 04/23/16   [provider]  Multiple Vitamins-Minerals (MULTIVITAMIN WITH MINERALS) tablet Take 1 tablet by mouth daily.    [provider]  omeprazole (PRILOSEC) 20 MG capsule Take by mouth.    [provider]  polyethylene glycol (MIRALAX / GLYCOLAX) packet Take 17 g by mouth daily.  [provider]  Spacer/Aero-Holding Chambers (BREATHERITE) MISC Inhale into the lungs. 07/03/16   [provider]    Allergies Codeine and Latex  Family History  Problem Relation Age of Onset  . Leukemia Mother        CLL  . Leukemia Father   . Hyperlipidemia Brother   . Stroke Brother   . Heart disease Paternal Uncle   . Diabetes Neg Hx     Social History Social History   Tobacco Use  . Smoking status: Never Smoker  . Smokeless tobacco: Never Used  Substance Use Topics  . Alcohol use: Yes    Comment: rare wine    . Drug use: Not on file    Review of Systems Constitutional: No fever/chills Eyes: No visual changes. ENT: No sore throat. Cardiovascular: Denies chest pain. Respiratory: Denies shortness of breath. Gastrointestinal: Persistent vomiting throughout the night with some upper abdominal discomfort which has improved Genitourinary: Negative for dysuria. Musculoskeletal: Negative for neck pain.  Negative for back pain. Integumentary: Negative for rash. Neurological: Negative for headaches, focal weakness or numbness.   ____________________________________________   PHYSICAL EXAM:  VITAL SIGNS: ED Triage Vitals  Enc Vitals Group     BP 04/25/17 0517 125/71     Pulse Rate 04/25/17 0517 63     Resp 04/25/17 0517 18     Temp 04/25/17 0517 97.6 F (36.4 C)     Temp Source 04/25/17 0517 Oral     SpO2 04/25/17 0517 98 %     Weight 04/25/17 0519 49.9 kg (110 lb)     Height 04/25/17 0519 1.575 m (5\' 2" )     Head Circumference --      Peak Flow --      Pain Score --      Pain Loc --      Pain Edu? --      Excl. in GC? --     Constitutional: Alert and oriented. Well appearing and in no acute distress. Eyes: Conjunctivae are normal.  Head: Atraumatic. Nose: No congestion/rhinnorhea. Mouth/Throat: Mucous membranes are moist. Neck: No stridor.  No meningeal signs.   Cardiovascular: Normal rate, regular rhythm. Good peripheral circulation. Grossly normal heart sounds. Respiratory: Normal respiratory effort.  No retractions. Lungs CTAB. Gastrointestinal: Mild distention.  Diffuse tenderness to palpation throughout but no rebound and no guarding, no focal tenderness Musculoskeletal: No lower extremity tenderness nor edema. No gross deformities of extremities. Neurologic:  Normal speech and language. No gross focal neurologic deficits are appreciated.  Skin:  Skin is warm, dry and intact. No rash noted. Psychiatric: Mood and affect are normal. Speech and behavior are  normal.  ____________________________________________   LABS (all labs ordered are listed, but only abnormal results are displayed)  Labs Reviewed  COMPREHENSIVE METABOLIC PANEL - Abnormal; Notable for the following components:      Result Value   Chloride 99 (*)    Glucose, Bld 207 (*)    All other components within normal limits  CBC - Abnormal; Notable for the following components:   WBC 14.0 (*)    All other components within normal limits  LIPASE, BLOOD  URINALYSIS, COMPLETE (UACMP) WITH MICROSCOPIC   ____________________________________________  EKG  No EKG ordered by ED physician ____________________________________________  RADIOLOGY   ED MD interpretation: Small bowel obstruction as per radiology report  Official radiology report(s): Ct Abdomen Pelvis W Contrast  Result Date: 04/25/2017 CLINICAL DATA:  83 year old female with abdominal pain and vomiting for 1 day. EXAM: CT  ABDOMEN AND PELVIS WITH CONTRAST TECHNIQUE: Multidetector CT imaging of the abdomen and pelvis was performed using the standard protocol following bolus administration of intravenous contrast. CONTRAST:  ISOVUE-300 IOPAMIDOL (ISOVUE-300) INJECTION 61% COMPARISON:  11/13/2016 FINDINGS: Lower chest: No acute abnormality. Left basilar scarring again noted. Hepatobiliary: Dilated portal veins again noted. No focal hepatic lesions are identified. The gallbladder is unremarkable. No biliary dilatation. Pancreas: Unremarkable Spleen: Unremarkable Adrenals/Urinary Tract: Kidneys, adrenal glands and bladder are unremarkable. Stomach/Bowel: A high-grade small bowel obstruction is noted with dilated proximal mid small bowel loops. The transition point lies within the mid lower abdomen (series 2, image 37 and series 5, image 22) without obstructing cause identified. No definite bowel wall thickening is noted. There is no evidence of pneumoperitoneum or abscess. Surgical changes of the distal colon again noted.  Vascular/Lymphatic: Aortic atherosclerosis. No enlarged abdominal or pelvic lymph nodes. Reproductive: No significant changes or definite abnormality. Other: No ascites or abdominal wall hernia. Musculoskeletal: Thoraco lumbar scoliosis and degenerative changes within the visualized spine again noted. IMPRESSION: 1. High-grade small bowel obstruction with transition point within the lower abdomen. Obstructing cause not identified and this may be related to an adhesion or possibly internal hernia. No evidence of ascites, pneumoperitoneum or abscess. 2.  Aortic Atherosclerosis (ICD10-I70.0). Electronically Signed   By: Harmon Pier M.D.   On: 04/25/2017 07:13    ____________________________________________   PROCEDURES  Critical Care performed: No   Procedure(s) performed:   Procedures   ____________________________________________   INITIAL IMPRESSION / ASSESSMENT AND PLAN / ED COURSE  As part of my medical decision making, I reviewed the following data within the electronic MEDICAL RECORD NUMBER Nursing notes reviewed and incorporated, Labs reviewed , Old chart reviewed and Discussed with admitting physician (Dr. Earlene Plater with surgery)    Differential diagnosis includes, but is not limited to, bowel obstruction/ileus, volvulus, acute intra-abdominal infection such as diverticulitis, gastritis, viral GI illness, etc.  Based on her history of presentation I strongly suspect SBO.  Lab results are reassuring.  No chest pain and no shortness of breath.  Mild leukocytosis.  Vital signs are stable.  CT with IV contrast only, 500 mL normal saline bolus, n.p.o. status.  Patient and family agrees  Clinical Course as of Apr 25 728  Sat Apr 25, 2017  0726 CT scan is consistent with small bowel obstruction.  I called and spoke by phone with Dr. Earlene Plater who will come to the emergency department, evaluate the patient personally, and admit her.  I have ordered an NG tube and IV fluids.  [CF]    Clinical Course  User Index [CF] Loleta Rose, MD    ____________________________________________  FINAL CLINICAL IMPRESSION(S) / ED DIAGNOSES  Final diagnoses:  SBO (small bowel obstruction) (HCC)     MEDICATIONS GIVEN DURING THIS VISIT:  Medications  sodium chloride 0.9 % bolus 500 mL (not administered)  0.9 %  sodium chloride infusion (not administered)  ondansetron (ZOFRAN) injection 4 mg (4 mg Intravenous Given 04/25/17 0542)  iopamidol (ISOVUE-300) 61 % injection 100 mL (100 mLs Intravenous Contrast Given 04/25/17 4540)     ED Discharge Orders    None       Note:  This document was prepared using Dragon voice recognition software and may include unintentional dictation errors.    Loleta Rose, MD 04/25/17 0730

## 2017-04-26 LAB — CBC
HCT: 39.1 % (ref 35.0–47.0)
Hemoglobin: 13.4 g/dL (ref 12.0–16.0)
MCH: 30.9 pg (ref 26.0–34.0)
MCHC: 34.3 g/dL (ref 32.0–36.0)
MCV: 89.9 fL (ref 80.0–100.0)
PLATELETS: 188 10*3/uL (ref 150–440)
RBC: 4.35 MIL/uL (ref 3.80–5.20)
RDW: 13.4 % (ref 11.5–14.5)
WBC: 9.3 10*3/uL (ref 3.6–11.0)

## 2017-04-26 LAB — BASIC METABOLIC PANEL
Anion gap: 8 (ref 5–15)
BUN: 11 mg/dL (ref 6–20)
CALCIUM: 8.6 mg/dL — AB (ref 8.9–10.3)
CHLORIDE: 105 mmol/L (ref 101–111)
CO2: 28 mmol/L (ref 22–32)
CREATININE: 0.67 mg/dL (ref 0.44–1.00)
GFR calc non Af Amer: >60 mL/min (ref >60)
Glucose, Bld: 118 mg/dL — ABNORMAL HIGH (ref 65–99)
Potassium: 3.6 mmol/L (ref 3.5–5.1)
SODIUM: 141 mmol/L (ref 135–145)

## 2017-04-26 MED ORDER — ONDANSETRON HCL 4 MG/2ML IJ SOLN
4.0000 mg | Freq: Once | INTRAMUSCULAR | Status: AC
  Administered 2017-04-26: 4 mg via INTRAVENOUS
  Filled 2017-04-26: qty 2

## 2017-04-26 NOTE — Progress Notes (Addendum)
SURGICAL PROGRESS NOTE (cpt [id number])  Hospital Day(s): 1.   Post op day(s):  Marland Kitchen   Interval History: Patient seen and examined, no acute events or new complaints overnight. Patient reports complete resolution of her abdominal pain, N/V, and distention, but denies any flatus yet. She otherwise says she wants to ambulate today, denies fever/chills, CP, or SOB.  Review of Systems:  Constitutional: denies fever, chills  HEENT: denies cough or congestion  Respiratory: denies any shortness of breath  Cardiovascular: denies chest pain or palpitations  Gastrointestinal: abdominal pain, N/V, and bowel function as per interval history Genitourinary: denies burning with urination or urinary frequency Musculoskeletal: denies pain, decreased motor or sensation Integumentary: denies any other rashes or skin discolorations Neurological: denies HA or vision/hearing changes   Vital signs in last 24 hours: [min-max] current  Temp:  [98 F (36.7 C)-98.8 F (37.1 C)] 98.8 F (37.1 C) (02/10 0516) Pulse Rate:  [63-85] 63 (02/10 0517) Resp:  [16-18] 17 (02/09 1934) BP: (107-135)/(56-95) 115/64 (02/10 0516) SpO2:  [93 %-98 %] 94 % (02/10 0517)     Height: 5\' 2"  (157.5 cm) Weight: 110 lb (49.9 kg) BMI (Calculated): 20.11   Intake/Output this shift:  No intake/output data recorded.   Intake/Output last 2 shifts:  @IOLAST2SHIFTS @   Physical Exam:  Constitutional: alert, cooperative and no distress  HENT: normocephalic without obvious abnormality  Eyes: PERRL, EOM's grossly intact and symmetric  Neuro: CN II - XII grossly intact and symmetric without deficit  Respiratory: breathing non-labored at rest  Cardiovascular: regular rate and sinus rhythm  Gastrointestinal: soft, non-tender, and mildly - minimally distended Musculoskeletal: UE and LE FROM, no edema or wounds, motor and sensation grossly intact, NT   Labs:  CBC Latest Ref Rng & Units 04/26/2017 04/25/2017 11/14/2016  WBC 3.6 - 11.0 K/uL 9.3  14.0(H) 7.3  Hemoglobin 12.0 - 16.0 g/dL 60.4 54.0 98.1  Hematocrit 35.0 - 47.0 % 39.1 40.4 38.3  Platelets 150 - 440 K/uL 188 216 183   CMP Latest Ref Rng & Units 04/26/2017 04/25/2017 11/14/2016  Glucose 65 - 99 mg/dL 191(Y) 782(N) 562(Z)  BUN 6 - 20 mg/dL 11 19 7   Creatinine 0.44 - 1.00 mg/dL 3.08 6.57 8.46  Sodium 135 - 145 mmol/L 141 138 141  Potassium 3.5 - 5.1 mmol/L 3.6 3.5 3.8  Chloride 101 - 111 mmol/L 105 99(L) 106  CO2 22 - 32 mmol/L 28 25 30   Calcium 8.9 - 10.3 mg/dL 9.6(E) 9.7 8.3(L)  Total Protein 6.5 - 8.1 g/dL - 7.1 -  Total Bilirubin 0.3 - 1.2 mg/dL - 1.2 -  Alkaline Phos 38 - 126 U/L - 67 -  AST 15 - 41 U/L - 37 -  ALT 14 - 54 U/L - 22 -   Imaging studies: No new pertinent imaging studies   Assessment/Plan: (ICD-10's: K56.52) 83 y.o. 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  - NPO, IV fluid - NG tube for nasogastric decompression - monitor ongoing bowel function and abdominal exam  - anticipate symptomatic relief within 24 - 48 hours following NGT insertion, followed by "rumbling" the following day and flatus either the same day or the day following the "rumbling" with anticipated length of stay ~3 - 5 days with successful non-operative management for 8 of 10 patients with small bowel obstruction secondary  to adhesions             - surgical intervention if doesn't improve was also discussed - medical management comorbidities (home meds) - ambulation encouraged - DVT prophylaxis  All of the above recommendations were discussed with the patient and patient's RN, and all of patient's questions were answered to her  expressed satisfaction.  -- Scherrie GerlachJason E. Earlene Plateravis, MD, RPVI [place]: [facility] - Partnering for exceptional care. Office: [phone]

## 2017-04-27 DIAGNOSIS — K56609 Unspecified intestinal obstruction, unspecified as to partial versus complete obstruction: Secondary | ICD-10-CM

## 2017-04-27 LAB — BASIC METABOLIC PANEL
Anion gap: 8 (ref 5–15)
BUN: 9 mg/dL (ref 6–20)
CALCIUM: 8.5 mg/dL — AB (ref 8.9–10.3)
CO2: 27 mmol/L (ref 22–32)
CREATININE: 0.84 mg/dL (ref 0.44–1.00)
Chloride: 104 mmol/L (ref 101–111)
GFR calc Af Amer: >60 mL/min (ref >60)
GFR calc non Af Amer: >60 mL/min (ref >60)
GLUCOSE: 106 mg/dL — AB (ref 65–99)
Potassium: 3.5 mmol/L (ref 3.5–5.1)
Sodium: 139 mmol/L (ref 135–145)

## 2017-04-27 MED ORDER — PSEUDOEPHEDRINE HCL ER 120 MG PO TB12
120.0000 mg | ORAL_TABLET | Freq: Two times a day (BID) | ORAL | Status: DC
  Administered 2017-04-27 (×2): 120 mg via ORAL
  Filled 2017-04-27 (×4): qty 1

## 2017-04-27 NOTE — Progress Notes (Signed)
NG placed to suction and I did not get any residual

## 2017-04-27 NOTE — Progress Notes (Signed)
Dr Aleen CampiPiscoya gave and order to start the patient on clear liquids at dinner time if she had less than 150ml of residual.  Patient did not have any residual

## 2017-04-27 NOTE — Evaluation (Signed)
Physical Therapy Evaluation Patient Details Name: Diana ReelMarie Owen MRN: [id number] DOB: 12/10/1934 Today's Date: 04/27/2017   History of Present Illness  83 yo female was admitted with SBO and vomiting was given NG tube, clear liquids, now referred to PT for eval.  PMHx: GERD, thyroid disease, volvulus, SBO, polyp  Clinical Impression  Pt was seen for evaluation of gait and note her need for assisted balance.  IV pole was used initially and was not appropriate, but with SPC was more controlled.  Recommend her to outpatient as she was noting instability with gait prior to SBO but did have this same issue x 2 in recent history.  Follow acutely for strengthening and gait until DC to outpatient.    Follow Up Recommendations Outpatient PT    Equipment Recommendations  Cane    Recommendations for Other Services       Precautions / Restrictions Precautions Precautions: Fall Restrictions Weight Bearing Restrictions: No      Mobility  Bed Mobility Overal bed mobility: Modified Independent             General bed mobility comments: extra time to sit up and adjust posture   Transfers Overall transfer level: Modified independent Equipment used: None                Ambulation/Gait Ambulation/Gait assistance: Min guard Ambulation Distance (Feet): 150 Feet Assistive device: Straight cane(IV pole) Gait Pattern/deviations: Step-through pattern;Wide base of support;Shuffle Gait velocity: reduced Gait velocity interpretation: Below normal speed for age/gender General Gait Details: pt is shuffling and leaning on IV pole and with SPC is distant supervision.    Stairs            Wheelchair Mobility    Modified Rankin (Stroke Patients Only)       Balance Overall balance assessment: Needs assistance Sitting-balance support: Feet supported Sitting balance-Leahy Scale: Good     Standing balance support: Bilateral upper extremity supported Standing balance-Leahy Scale:  Fair                               Pertinent Vitals/Pain Pain Assessment: No/denies pain    Home Living Family/patient expects to be discharged to:: Private residence Living Arrangements: Spouse/significant other Available Help at Discharge: Family;Available 24 hours/day Type of Home: Apartment Home Access: Level entry     Home Layout: One level Home Equipment: None      Prior Function Level of Independence: Independent               Hand Dominance   Dominant Hand: Right    Extremity/Trunk Assessment   Upper Extremity Assessment Upper Extremity Assessment: Overall WFL for tasks assessed    Lower Extremity Assessment Lower Extremity Assessment: Overall WFL for tasks assessed    Cervical / Trunk Assessment Cervical / Trunk Assessment: Normal  Communication   Communication: No difficulties  Cognition Arousal/Alertness: Awake/alert Behavior During Therapy: WFL for tasks assessed/performed Overall Cognitive Status: Within Functional Limits for tasks assessed                                        General Comments      Exercises     Assessment/Plan    PT Assessment Patient needs continued PT services  PT Problem List Decreased range of motion;Decreased activity tolerance;Decreased balance;Decreased mobility;Decreased coordination;Decreased knowledge of use of DME;Decreased safety awareness  PT Treatment Interventions DME instruction;Gait training;Functional mobility training;Therapeutic activities;Therapeutic exercise;Balance training;Neuromuscular re-education;Patient/family education    PT Goals (Current goals can be found in the Care Plan section)  Acute Rehab PT Goals Patient Stated Goal: to get home and feel better PT Goal Formulation: With patient/family Time For Goal Achievement: 05/11/17 Potential to Achieve Goals: Good    Frequency Min 3X/week   Barriers to discharge   pt will need follow up therapy and  SPC    Co-evaluation               AM-PAC PT "6 Clicks" Daily Activity  Outcome Measure Difficulty turning over in bed (including adjusting bedclothes, sheets and blankets)?: A Little Difficulty moving from lying on back to sitting on the side of the bed? : A Little Difficulty sitting down on and standing up from a chair with arms (e.g., wheelchair, bedside commode, etc,.)?: A Little Help needed moving to and from a bed to chair (including a wheelchair)?: A Little Help needed walking in hospital room?: A Little Help needed climbing 3-5 steps with a railing? : A Little 6 Click Score: 18    End of Session Equipment Utilized During Treatment: Gait belt Activity Tolerance: Patient tolerated treatment well Patient left: in bed;with call bell/phone within reach;with family/visitor present(sitting bedside) Nurse Communication: Mobility status PT Visit Diagnosis: Unsteadiness on feet (R26.81);Muscle weakness (generalized) (M62.81);Difficulty in walking, not elsewhere classified (R26.2)    Time: 8413-2440 PT Time Calculation (min) (ACUTE ONLY): 28 min   Charges:   PT Evaluation $PT Eval Moderate Complexity: 1 Mod PT Treatments $Gait Training: 8-22 mins   PT G Codes:   PT G-Codes **NOT FOR INPATIENT CLASS** Functional Assessment Tool Used: AM-PAC 6 Clicks Basic Mobility    Ivar Drape 04/27/2017, 8:26 PM   Samul Dada, PT MS Acute Rehab Dept. Number: [facility] [id number] and [facility] [phone]

## 2017-04-27 NOTE — Progress Notes (Signed)
04/27/2017  Subjective: No acute events overnight.  Patient started having flatus.  Denies any nausea.  No abdominal pain.  Reports having some sinus congestion and pressure.  Vital signs: Temp:  [98.1 F (36.7 C)-98.6 F (37 C)] 98.2 F (36.8 C) (02/11 0524) Pulse Rate:  [59-70] 70 (02/11 0524) Resp:  [18] 18 (02/11 0524) BP: (111-130)/(57-72) 129/59 (02/11 0524) SpO2:  [92 %-94 %] 92 % (02/11 0524)   Intake/Output: 02/10 0701 - 02/11 0700 In: 1104 [I.V.:1104] Out: 1750 [Urine:1050; Emesis/NG output:700] Last BM Date: 04/24/17  Physical Exam: Constitutional: No acute distress Abdomen: Soft, nondistended, nontender to palpation.  NG tube in place with gastric contents and canister.  Labs:  Recent Labs    04/25/17 0534 04/26/17 0529  WBC 14.0* 9.3  HGB 13.6 13.4  HCT 40.4 39.1  PLT 216 188   Recent Labs    04/26/17 0529 04/27/17 0454  NA 141 139  K 3.6 3.5  CL 105 104  CO2 28 27  GLUCOSE 118* 106*  BUN 11 9  CREATININE 0.67 0.84  CALCIUM 8.6* 8.5*   No results for input(s): LABPROT, INR in the last 72 hours.  Imaging: No results found.  Assessment/Plan: 83 year old female with small bowel obstruction, now resolving.  -We will do NG clamping trial today.  I suspect we will be able to remove the NG tube today and start her on clears for dinner. -We will start Sudafed for her sinus congestion. -Out of bed and ambulate.  Will place PT consult.   Howie IllJose Luis Valborg Friar, MD [facility]

## 2017-04-28 DIAGNOSIS — K565 Intestinal adhesions [bands], unspecified as to partial versus complete obstruction: Secondary | ICD-10-CM

## 2017-04-28 LAB — BASIC METABOLIC PANEL
Anion gap: 6 (ref 5–15)
BUN: 7 mg/dL (ref 6–20)
CO2: 27 mmol/L (ref 22–32)
CREATININE: 0.58 mg/dL (ref 0.44–1.00)
Calcium: 8.6 mg/dL — ABNORMAL LOW (ref 8.9–10.3)
Chloride: 105 mmol/L (ref 101–111)
GFR calc non Af Amer: >60 mL/min (ref >60)
Glucose, Bld: 104 mg/dL — ABNORMAL HIGH (ref 65–99)
Potassium: 3.7 mmol/L (ref 3.5–5.1)
SODIUM: 138 mmol/L (ref 135–145)

## 2017-04-28 MED ORDER — LEVOTHYROXINE SODIUM 100 MCG PO TABS
100.0000 ug | ORAL_TABLET | Freq: Every day | ORAL | Status: DC
  Administered 2017-04-28: 100 ug via ORAL
  Filled 2017-04-28: qty 1

## 2017-04-28 NOTE — Care Management Important Message (Signed)
Important Message  Patient Details  Name: Diana ReelMarie Gowell MRN: [id number] Date of Birth: June 21, 1934   Medicare Important Message Given:  Yes    Chapman FitchBOWEN, Alonia Dibuono T, RN 04/28/2017, 11:59 AM

## 2017-04-28 NOTE — Progress Notes (Signed)
04/28/2017  Subjective: No acute events overnight.  NG tube was removed yesterday after successful clamping trial.  She was started on clears with no issues.  She did have bowel movement yesterday.  Denies any nausea or vomiting or worsening pain.  Vital signs: Temp:  [97.9 F (36.6 C)-98.1 F (36.7 C)] 97.9 F (36.6 C) (02/12 0500) Pulse Rate:  [66-81] 66 (02/12 0500) Resp:  [15-17] 17 (02/12 0500) BP: (101-126)/(56-88) 101/56 (02/12 0500) SpO2:  [95 %-100 %] 95 % (02/12 0500)   Intake/Output: 02/11 0701 - 02/12 0700 In: 3962.7 [P.O.:720; I.V.:3242.7] Out: 1600 [Urine:1600] Last BM Date: 04/27/17  Physical Exam: Constitutional: No acute distress Abdomen: Soft, nondistended, nontender to palpation.  Labs:  Recent Labs    04/26/17 0529  WBC 9.3  HGB 13.4  HCT 39.1  PLT 188   Recent Labs    04/27/17 0454 04/28/17 0447  NA 139 138  K 3.5 3.7  CL 104 105  CO2 27 27  GLUCOSE 106* 104*  BUN 9 7  CREATININE 0.84 0.58  CALCIUM 8.5* 8.6*   No results for input(s): LABPROT, INR in the last 72 hours.  Imaging: No results found.  Assessment/Plan: 83 year old female with small bowel obstruction, now resolved.  -We will advance to full liquid diet for breakfast and a soft diet for lunch.  If the patient does well, she may be able to discharge to home later this afternoon versus early tomorrow morning.   Howie IllJose Luis Jamilette Suchocki, MD [facility]

## 2017-04-28 NOTE — Progress Notes (Signed)
Discharge order received. Patient is alert and oriented. Vital signs stable . No signs of acute distress. Discharge instructions given. Patient verbalized understanding. No other issues noted at this time.   

## 2017-04-28 NOTE — Discharge Summary (Signed)
Patient ID: Diana ReelMarie Bell MRN: [id number] DOB/AGE: October 18, 1934 83 y.o.  Admit date: 04/25/2017 Discharge date: 04/28/2017   Discharge Diagnoses:  Active Problems:   Small bowel obstruction due to adhesions [facility](HCC)   Procedures:  None  Hospital Course: Patient was admitted on 2/9 with a small bowel obstruction.  Conservative management was started with n.p.o. diet, NG tube placement, and IV fluid hydration.  She regained bowel function by 2/10.  NG tube was removed on 2/11 after successful clamping trial and her diet was slowly advanced.  By 2/12, she was tolerating a soft diet with continued bowel function and no worsening pain or nausea.  She was deemed ready for discharge to home.  Consults: None  Disposition: 01-Home or Self Care  Discharge Instructions    Activity as tolerated - No restrictions   Complete by:  As directed    Call MD for:  difficulty breathing, headache or visual disturbances   Complete by:  As directed    Call MD for:  persistant nausea and vomiting   Complete by:  As directed    Call MD for:  severe uncontrolled pain   Complete by:  As directed    Call MD for:  temperature >100.4   Complete by:  As directed    Diet - low sodium heart healthy   Complete by:  As directed    Discharge instructions   Complete by:  As directed    1.  Continue a soft diet for 2 more days and then may advance to regular solid food diet. 2.  Call MD if any worsening nausea, vomiting, abdominal distention, or worsening abdominal pain.     Allergies as of 04/28/2017      Reactions   Codeine Nausea And Vomiting   Latex Rash      Medication List    TAKE these medications   albuterol 108 (90 Base) MCG/ACT inhaler Commonly known as:  PROVENTIL HFA;VENTOLIN HFA Inhale 2 puffs into the lungs every 4 (four) hours as needed for wheezing or shortness of breath.   cholecalciferol 1000 units tablet Commonly known as:  VITAMIN D Take 1,000 Units by mouth daily.   levothyroxine 100 MCG  tablet Commonly known as:  SYNTHROID, LEVOTHROID Take 100 mcg by mouth daily.   multivitamin with minerals tablet Take 1 tablet by mouth daily.   omeprazole 20 MG capsule Commonly known as:  PRILOSEC Take 20 mg by mouth daily as needed (reflux).   Vitamin B-12 6000 MCG Subl Place 1 tablet under the tongue daily.      Follow-up Information    Karie SchwalbeLetvak, Richard I, MD Follow up.   Specialties:  Internal Medicine, Pediatrics Why:  Follow up with your PCP for overall health management. Contact information: [address] [place] [address] [phone]        Henrene DodgePiscoya, Kamica Florance, MD Follow up.   Specialty:  Surgery Why:  Follow up as needed if any worsening symptoms. Contact information: [address] [place] [address] [phone]

## 2017-04-30 ENCOUNTER — Telehealth: Payer: Self-pay | Admitting: *Deleted

## 2017-04-30 NOTE — Telephone Encounter (Signed)
Okay see prn then

## 2017-04-30 NOTE — Telephone Encounter (Signed)
Spoke to pt and attempted to complete TCM. She states she was "busy with her husband and didn't have time." She also states that she cancelled her 2/22 hospital f/u because she has a f/u with GI on 2/20 and "thinks that may be ok." She will contact the office back after her GI appt, depending on what info she receives there.

## 2017-05-06 ENCOUNTER — Ambulatory Visit (INDEPENDENT_AMBULATORY_CARE_PROVIDER_SITE_OTHER): Payer: Medicare Other | Admitting: Gastroenterology

## 2017-05-06 ENCOUNTER — Encounter: Payer: Self-pay | Admitting: Gastroenterology

## 2017-05-06 VITALS — BP 119/82 | HR 82 | Ht 61.5 in | Wt 114.4 lb

## 2017-05-06 DIAGNOSIS — R1907 Generalized intra-abdominal and pelvic swelling, mass and lump: Secondary | ICD-10-CM

## 2017-05-06 DIAGNOSIS — Z8719 Personal history of other diseases of the digestive system: Secondary | ICD-10-CM

## 2017-05-06 NOTE — Progress Notes (Signed)
Wyline Mood MD, MRCP(U.K) [address]  [address]  [place], [place] [address]  Main: [phone]  Fax: [phone]   Primary Care Physician: Karie Schwalbe, MD  Primary Gastroenterologist:  Dr. Wyline Mood   No chief complaint on file.   HPI: Diana Owen is a 83 y.o. female here to transfer care  Summary of history :  She is here today to see me to transfer her care. She was seen in 12/2016 by Dr Allegra Lai for dysphagia. 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  , few other recent admissions for the same reason. She does have a history of severe constipation. H/o dysphagia too for several years. Prior dilations x2 . Last visit they did discuss EGD but the patient was not too keen and a barium swallow was ordered which was not obtained.   Interval history   10//2018-  05/06/2016   She says since discharge she started feeling better, then developed some pains over her abdomen from 3-5 days after discharge. It would be so severe that he could not get out of bed. She says it has slowly got better. She has a bowel movement, daily , soft , on miralax, hot water with a meal does make her feel better. She has had abdominal surgery for "polyp in her intestines" in 2002 and Her abdominal issues began 2 years back. She says she had lost some weight at the hospital and now has gained a bit back . She says that she is not having any issues swallowing , when it gets down into her abdomen , feels the food sits in her stomach for a long time. Some days feels it more than other days.    CT abdomen 04/25/17 showed SBO with transition point within lower abdomen ?secondary to adhesion or possible internal hernia.    Current Outpatient Medications  Medication Sig Dispense Refill  . albuterol (PROVENTIL HFA;VENTOLIN HFA) 108 (90 Base) MCG/ACT inhaler Inhale 2 puffs into the lungs every 4  (four) hours as needed for wheezing or shortness of breath.     . cholecalciferol (VITAMIN D) 1000 units tablet Take 1,000 Units by mouth daily.    . Cyanocobalamin (VITAMIN B-12) 6000 MCG SUBL Place 1 tablet under the tongue daily.    Marland Kitchen levothyroxine (SYNTHROID, LEVOTHROID) 100 MCG tablet Take 100 mcg by mouth daily.    . Multiple Vitamins-Minerals (MULTIVITAMIN WITH MINERALS) tablet Take 1 tablet by mouth daily.    Marland Kitchen omeprazole (PRILOSEC) 20 MG capsule Take 20 mg by mouth daily as needed (reflux).     . SF 5000 PLUS 1.1 % CREA dental cream APPLY A PEA-SIZED AMOUNT TO TOOTHBRUSH IN PLACE OF NORMAL PASTE; ...  (REFER TO PRESCRIPTION NOTES).  0   No current facility-administered medications for this visit.     Allergies as of 05/06/2017 - Review Complete 04/25/2017  Allergen Reaction Noted  . Codeine Nausea And Vomiting 11/12/2016  . Latex Rash 11/21/2016    ROS:  General: Negative for anorexia, weight loss, fever, chills, fatigue, weakness. ENT: Negative for hoarseness, difficulty swallowing , nasal congestion. CV: Negative for chest pain, angina, palpitations, dyspnea on exertion, peripheral edema.  Respiratory: Negative for dyspnea at rest, dyspnea on exertion, cough, sputum, wheezing.  GI: See history of present illness. GU:  Negative for dysuria, hematuria, urinary incontinence, urinary frequency, nocturnal urination.  Endo: Negative for unusual weight change.  Physical Examination:   There were no vitals taken for this visit.  General: Well-nourished, well-developed in no acute distress.  Eyes: No icterus. Conjunctivae pink. Mouth: Oropharyngeal mucosa moist and pink , no lesions erythema or exudate. Lungs: Clear to auscultation bilaterally. Non-labored. Heart: Regular rate and rhythm, no murmurs rubs or gallops.  Abdomen: central abdominal scar. Bowel sounds are normal, nontender, nondistended, no hepatosplenomegaly or masses, no abdominal bruits or hernia , no rebound or  guarding.   Extremities: No lower extremity edema. No clubbing or deformities. Neuro: Alert and oriented x 3.  Grossly intact. Skin: Warm and dry, no jaundice.   Psych: Alert and cooperative, normal mood and affect.   Imaging Studies: Dg Abdomen 1 View  Result Date: 04/25/2017 CLINICAL DATA:  NG tube placement EXAM: ABDOMEN - 1 VIEW COMPARISON:  CT 04/25/2017 FINDINGS: NG tube tip is in the mid stomach. Contrast material noted within the renal collecting systems bilaterally from recent CT. Mild gaseous distention of bowel loops again noted as seen on CT compatible with small bowel obstruction. IMPRESSION: NG tube tip noted in the mid stomach. Electronically Signed   By: Charlett NoseKevin  Dover M.D.   On: 04/25/2017 09:39   Ct Abdomen Pelvis W Contrast  Result Date: 04/25/2017 CLINICAL DATA:  83 year old female with abdominal pain and vomiting for 1 day. EXAM: CT ABDOMEN AND PELVIS WITH CONTRAST TECHNIQUE: Multidetector CT imaging of the abdomen and pelvis was performed using the standard protocol following bolus administration of intravenous contrast. CONTRAST:  100mL ISOVUE-300 IOPAMIDOL (ISOVUE-300) INJECTION 61% COMPARISON:  11/13/2016 FINDINGS: Lower chest: No acute abnormality. Left basilar scarring again noted. Hepatobiliary: Dilated portal veins again noted. No focal hepatic lesions are identified. The gallbladder is unremarkable. No biliary dilatation. Pancreas: Unremarkable Spleen: Unremarkable Adrenals/Urinary Tract: Kidneys, adrenal glands and bladder are unremarkable. Stomach/Bowel: A high-grade small bowel obstruction is noted with dilated proximal mid small bowel loops. The transition point lies within the mid lower abdomen (series 2, image 37 and series 5, image 22) without obstructing cause identified. No definite bowel wall thickening is noted. There is no evidence of pneumoperitoneum or abscess. Surgical changes of the distal colon again noted. Vascular/Lymphatic: Aortic atherosclerosis. No  enlarged abdominal or pelvic lymph nodes. Reproductive: No significant changes or definite abnormality. Other: No ascites or abdominal wall hernia. Musculoskeletal: Thoraco lumbar scoliosis and degenerative changes within the visualized spine again noted. IMPRESSION: 1. High-grade small bowel obstruction with transition point within the lower abdomen. Obstructing cause not identified and this may be related to an adhesion or possibly internal hernia. No evidence of ascites, pneumoperitoneum or abscess. 2.  Aortic Atherosclerosis (ICD10-I70.0). Electronically Signed   By: Harmon PierJeffrey  Hu M.D.   On: 04/25/2017 07:13    Assessment and Plan:   Diana Owen is a 83 y.o. y/o female here to follow up and switch providers for her GI care. She has a history of recurrent SBO and follows with Dr Everlene FarrierPabon. She has had abdominal surgery and its very likely the scar tissue has caused an adhesion and bowel obstructions. Prior imaging shows a transition point and I will obtain a MR enterogram to r/o any intraluminal lesions as the cause of the obstruction. In the interim suggest a low residue diet, daily miralax.  Based on results of MR enterogram can discuss if there are truly any other correctable options. She has no dysphagia presently and hence EGD not warrranted at this time.   Dr Wyline MoodKiran Paeton Studer  MD,MRCP [facility](U.K) Follow up in 6 weeks   .

## 2017-05-08 ENCOUNTER — Inpatient Hospital Stay: Payer: Medicare Other | Admitting: Internal Medicine

## 2017-05-11 ENCOUNTER — Telehealth: Payer: Self-pay | Admitting: Gastroenterology

## 2017-05-11 NOTE — Telephone Encounter (Signed)
Patient called and stated she is having an MRI and is claustrophobic and needs Dr Tobi BastosAnna to call her in something so she can have the MRI

## 2017-05-12 ENCOUNTER — Other Ambulatory Visit: Payer: Self-pay

## 2017-05-12 DIAGNOSIS — Z01812 Encounter for preprocedural laboratory examination: Secondary | ICD-10-CM

## 2017-05-12 MED ORDER — LORAZEPAM 1 MG PO TABS: ORAL_TABLET | ORAL | 0 refills | Status: DC

## 2017-05-12 NOTE — Telephone Encounter (Signed)
I think we can have an order for radiology to provide sedation if needed for MRI

## 2017-05-12 NOTE — Telephone Encounter (Signed)
Returned patient's call.   Medication has been called into [facility] on [address].  Advised patient of location. Radiology nurse will advise patient on when to take medication.

## 2017-05-12 NOTE — Telephone Encounter (Signed)
Pt is calling back for rx for MRI

## 2017-05-15 ENCOUNTER — Ambulatory Visit
Admission: RE | Admit: 2017-05-15 | Discharge: 2017-05-15 | Disposition: A | Discharge status: Home / Self Care | Payer: Medicare Other | Source: Ambulatory Visit | Attending: Gastroenterology | Admitting: Gastroenterology

## 2017-05-15 ENCOUNTER — Telehealth: Payer: Self-pay

## 2017-05-15 DIAGNOSIS — R1907 Generalized intra-abdominal and pelvic swelling, mass and lump: Secondary | ICD-10-CM

## 2017-05-15 DIAGNOSIS — K56609 Unspecified intestinal obstruction, unspecified as to partial versus complete obstruction: Secondary | ICD-10-CM | POA: Diagnosis not present

## 2017-05-15 MED ORDER — GADOBENATE DIMEGLUMINE 529 MG/ML IV SOLN
10.0000 mL | Freq: Once | INTRAVENOUS | Status: AC | PRN
  Administered 2017-05-15: 10 mL via INTRAVENOUS

## 2017-05-15 NOTE — Telephone Encounter (Signed)
Gave patient Diana Owen's response:    - suggest a salad with LOWFODMAP   Advised staying as close to her bland diet as possible only adding the salad due to fiber causing stomach swelling.

## 2017-05-15 NOTE — Telephone Encounter (Signed)
Advised patient of results per Dr. Tobi BastosAnna.   - MR enterogram is normal , no cause for bowel obstruction   Patient is enquiring about the bland diet she's currently regulated to. She says that it works and her stomach isn't swelling but she would love to eat a salad. The diet states no raw vegetables or fruits.   Sent a message to Dr. Tobi BastosAnna for review.  Advised patient I would callback next week with his response.

## 2017-05-15 NOTE — Telephone Encounter (Signed)
-----   Message from Wyline MoodKiran Anna, MD sent at 05/15/2017  1:55 PM EST ----- Regarding: RE: Diet Change Fantastic, suggest a salad with LOWFODMAP ----- Message ----- From: Ethlyn Galleryarter, Katha Kuehne, CMA Sent: 05/15/2017   1:36 PM To: Wyline MoodKiran Anna, MD Subject: Diet Change                                    Hi,   I gave Diana Owen her MR Enterogram results and she's estatic.  Currently she's on the low fiber diet. She says that it works and her stomach isn't swelling but she would love to eat a salad. The diet states no raw vegetables or fruits.   Can she have a salad?  Thanks Gerado Nabers C.

## 2017-05-29 ENCOUNTER — Ambulatory Visit: Payer: Medicare Other | Admitting: Internal Medicine

## 2017-06-02 ENCOUNTER — Encounter: Payer: Self-pay | Admitting: Internal Medicine

## 2017-06-02 ENCOUNTER — Ambulatory Visit (INDEPENDENT_AMBULATORY_CARE_PROVIDER_SITE_OTHER): Payer: Medicare Other | Admitting: Internal Medicine

## 2017-06-02 VITALS — BP 118/78 | HR 66 | Temp 97.6°F | Wt 114.5 lb

## 2017-06-02 DIAGNOSIS — E441 Mild protein-calorie malnutrition: Secondary | ICD-10-CM

## 2017-06-02 DIAGNOSIS — Q433 Congenital malformations of intestinal fixation: Secondary | ICD-10-CM

## 2017-06-02 NOTE — Progress Notes (Signed)
Subjective:    Patient ID: Diana Owen, female    DOB: 12-07-1934, 83 y.o.   MRN: [id number]  HPI Here with husband for follow up of GI issues  Doing better now Reviewed recent MR enterogram---was normal No evidence of small bowel stricture Is careful with her eating---limiting roughage Drinks hot water with lemon and honey --helps her digest her food  Still 10# under her normal adult weight Still using ensure at times--fairly regularly  Current Outpatient Medications on File Prior to Visit  Medication Sig Dispense Refill  . albuterol (PROVENTIL HFA;VENTOLIN HFA) 108 (90 Base) MCG/ACT inhaler Inhale 2 puffs into the lungs every 4 (four) hours as needed for wheezing or shortness of breath.     . cholecalciferol (VITAMIN D) 1000 units tablet Take 1,000 Units by mouth daily.    . Cyanocobalamin (VITAMIN B-12) 6000 MCG SUBL Place 1 tablet under the tongue daily.    Marland Kitchen. levothyroxine (SYNTHROID, LEVOTHROID) 100 MCG tablet Take 100 mcg by mouth daily.    . Multiple Vitamins-Minerals (MULTIVITAMIN WITH MINERALS) tablet Take 1 tablet by mouth daily.    Marland Kitchen. omeprazole (PRILOSEC) 20 MG capsule Take 20 mg by mouth daily as needed (reflux).     . polyethylene glycol (MIRALAX / GLYCOLAX) packet Take 17 g by mouth daily.    . SF 5000 PLUS 1.1 % CREA dental cream APPLY A PEA-SIZED AMOUNT TO TOOTHBRUSH IN PLACE OF NORMAL PASTE; ...  (REFER TO PRESCRIPTION NOTES).  0  . LORazepam (ATIVAN) 1 MG tablet Take 1 tab prior to imaging. Medication instructions given by Radiology Nurse. (Patient not taking: Reported on 06/02/2017) 30 tablet 0   No current facility-administered medications on file prior to visit.     Allergies  Allergen Reactions  . Codeine Nausea And Vomiting  . Latex Rash    Past Medical History:  Diagnosis Date  . GERD (gastroesophageal reflux disease)   . Intractable vomiting with nausea   . Midgut volvulus   . SBO (small bowel obstruction) (HCC) 11/13/2016  . Small bowel  obstruction (HCC)   . Thyroid disease     Past Surgical History:  Procedure Laterality Date  . APPENDECTOMY    . PARTIAL COLECTOMY     large polyp  . TONSILLECTOMY    . TUBAL LIGATION      Family History  Problem Relation Age of Onset  . Leukemia Mother        CLL  . Leukemia Father   . Hyperlipidemia Brother   . Stroke Brother   . Heart disease Paternal Uncle   . Diabetes Neg Hx     Social History   Socioeconomic History  . Marital status: Married    Spouse name: Not on file  . Number of children: 3  . Years of education: Not on file  . Highest education level: Not on file  Social Needs  . Financial resource strain: Not on file  . Food insecurity - worry: Not on file  . Food insecurity - inability: Not on file  . Transportation needs - medical: Not on file  . Transportation needs - non-medical: Not on file  Occupational History  . 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 Smoking status: Never Smoker  . Smokeless tobacco: Never Used  Substance and Sexual Activity  . Alcohol use: Yes    Comment: rare wine   . Drug use: No  . Sexual activity: Yes  Other  Topics Concern  . Not on file  Social History Narrative   2 sons, 1 daughter--- Brett Canales in [place] in [place], Selinda Orion in [place]      Has a living will   Daughter [name], then sons, should make health care decisions   Would accept resuscitation attempts   Not sure about tube feeds   Review of Systems Moves bowels regularly Weight is stable Occasional "panic type attack" when rushes out to car after eating (like for an appt)    Objective:   Physical Exam  Constitutional: She appears well-developed. No distress.  Pulmonary/Chest: Effort normal and breath sounds normal. No respiratory distress. She has no wheezes. She has no rales.  Abdominal: Soft. She exhibits no distension. There is no tenderness. There is no rebound and no guarding.            Assessment & Plan:

## 2017-06-02 NOTE — Assessment & Plan Note (Signed)
With recurrent SBO Better now and reassuring MR Discussed diet and regular bowels

## 2017-06-02 NOTE — Assessment & Plan Note (Signed)
Has stabilized Discussed nutrition and protein intake

## 2017-06-17 ENCOUNTER — Ambulatory Visit (INDEPENDENT_AMBULATORY_CARE_PROVIDER_SITE_OTHER): Payer: Medicare Other | Admitting: Gastroenterology

## 2017-06-17 ENCOUNTER — Encounter: Payer: Self-pay | Admitting: Gastroenterology

## 2017-06-17 VITALS — BP 115/77 | HR 74 | Temp 98.0°F | Ht 61.5 in | Wt 116.6 lb

## 2017-06-17 DIAGNOSIS — Z8719 Personal history of other diseases of the digestive system: Secondary | ICD-10-CM

## 2017-06-17 NOTE — Progress Notes (Signed)
Wyline MoodKiran Haileigh Pitz MD, MRCP(U.K) [address]  [address]  [place], [place] [address]  Main: [phone]  Fax: [phone]   Primary Care Physician: Karie SchwalbeLetvak, Richard I, MD  Primary Gastroenterologist:  Dr. Wyline MoodKiran Kellyann Ordway   Chief Complaint  Patient presents with  . Early Satiety    HPI: Diana ReelMarie Owen is a 83 y.o. female   Summary of history :  She is here today to see me to follow up to her last visit. Marland Kitchen. She was seen in 12/2016 by Dr Allegra LaiVanga for dysphagia. 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  , few other recent admissions for the same reason. She does have a history of severe constipation. H/o dysphagia too for several years. Prior dilations x2 . Last visit they did discuss EGD but the patient was not too keen and a barium swallow was ordered which was not obtained. She has had abdominal surgery for "polyp in her intestines" in 2002 and Her abdominal issues began 2 years back CT abdomen 04/25/17 showed SBO with transition point within lower abdomen ?secondary to adhesion or possible internal hernia.   Interval history 05/06/2016 -06/17/17    05/15/17- MR enterogram normal small bowel .   Some bloating when she eats , everytime she eats has abdominal bloating and resolves in 30 mins. She does consume small meals with lots of fluids.  No issues swallowing .    Current Outpatient Medications  Medication Sig Dispense Refill  . albuterol (PROVENTIL HFA;VENTOLIN HFA) 108 (90 Base) MCG/ACT inhaler Inhale 2 puffs into the lungs every 4 (four) hours as needed for wheezing or shortness of breath.     . cholecalciferol (VITAMIN D) 1000 units tablet Take 1,000 Units by mouth daily.    . Cyanocobalamin (VITAMIN B-12) 6000 MCG SUBL Place 1 tablet under the tongue daily.    Marland Kitchen. levothyroxine (SYNTHROID, LEVOTHROID) 100 MCG tablet Take 100 mcg by mouth daily.    . Multiple  Vitamins-Minerals (MULTIVITAMIN WITH MINERALS) tablet Take 1 tablet by mouth daily.    Marland Kitchen. omeprazole (PRILOSEC) 20 MG capsule Take 20 mg by mouth daily as needed (reflux).     . polyethylene glycol (MIRALAX / GLYCOLAX) packet Take 17 g by mouth daily.    . SF 5000 PLUS 1.1 % CREA dental cream APPLY A PEA-SIZED AMOUNT TO TOOTHBRUSH IN PLACE OF NORMAL PASTE; ...  (REFER TO PRESCRIPTION NOTES).  0   No current facility-administered medications for this visit.     Allergies as of 06/17/2017 - Review Complete 06/17/2017  Allergen Reaction Noted  . Codeine Nausea And Vomiting 11/12/2016  . Latex Rash 11/21/2016    ROS:  General: Negative for anorexia, weight loss, fever, chills, fatigue, weakness. ENT: Negative for hoarseness, difficulty swallowing , nasal congestion. CV: Negative for chest pain, angina, palpitations, dyspnea on exertion, peripheral edema.  Respiratory: Negative for dyspnea at rest, dyspnea on exertion, cough, sputum, wheezing.  GI: See history of present illness. GU:  Negative for dysuria, hematuria, urinary incontinence, urinary frequency, nocturnal urination.  Endo: Negative for unusual weight change.    Physical Examination:   BP 115/77 (BP Location: Left Arm, Patient Position: Sitting, Cuff Size: Normal)   Pulse 74   Temp 98 F (36.7 C) (Oral)   Ht 5' 1.5" (1.562 m)   Wt 116 lb 9.6 oz (52.9 kg)   BMI 21.67 kg/m   General: Well-nourished, well-developed in no acute distress.  Eyes: No icterus. Conjunctivae pink. Mouth: Oropharyngeal mucosa moist and pink , no lesions erythema or exudate. Lungs: Clear to auscultation bilaterally. Non-labored. Heart: Regular rate and rhythm, no murmurs rubs or gallops.  Abdomen: Bowel sounds are normal, nontender, nondistended, no hepatosplenomegaly or masses, no abdominal bruits or hernia , no rebound or guarding.   Extremities: No lower extremity edema. No clubbing or deformities. Neuro: Alert and oriented x 3.  Grossly  intact. Skin: Warm and dry, no jaundice.   Psych: Alert and cooperative, normal mood and affect.   Imaging Studies: No results found.  Assessment and Plan:   Diana Owen is a 83 y.o. y/o female here to follow up .  She has a history of recurrent SBO and follows with Dr Everlene Farrier. She has had abdominal surgery and its very likely the scar tissue has caused an adhesion and bowel obstructions.MRI abdomen shows no luminal obstruction. Discussed long term strategy to consume multiple small meals low in fiber, accompanied with good qty of carbonated beverages or liquids. If has  Distension or bloating to back off on food inatke for a few hours .   Dr Wyline Mood  MD,MRCP [facility]) Follow up PRN

## 2017-06-17 NOTE — Progress Notes (Signed)
Patient states that her food moves slowly thru the intestines. Sits in her stomach for about 30 minutes. She's eating 5 small meals a day.

## 2017-06-19 ENCOUNTER — Telehealth: Payer: Self-pay | Admitting: Internal Medicine

## 2017-06-19 MED ORDER — LEVOTHYROXINE SODIUM 100 MCG PO TABS: 100.0000 ug | ORAL_TABLET | Freq: Every day | ORAL | 3 refills | Status: DC

## 2017-06-19 NOTE — Telephone Encounter (Signed)
Copied from CRM [phone]. Topic: Quick Communication - Rx Refill/Question >> Jun 19, 2017 11:40 AM Louie BunPalacios Medina, Rosey Batheresa D wrote: Medication:  levothyroxine (SYNTHROID, LEVOTHROID) 100 MCG tablet  Has the patient contacted their pharmacy?Yes (Agent: If no, request that the patient contact the pharmacy for the refill.) Preferred Pharmacy (with phone number or street name): [facility] Agent: Please be advised that RX refills may take up to 3 business days. We ask that you follow-up with your pharmacy.

## 2017-06-19 NOTE — Addendum Note (Signed)
Addended by: Eual FinesBRIDGES, Aariel Ems P on: 06/19/2017 01:05 PM   Modules accepted: Orders

## 2017-06-19 NOTE — Telephone Encounter (Signed)
LOV  06/02/17 Dr. Alphonsus SiasLetvak Provider name is not associated with the medication

## 2017-06-29 ENCOUNTER — Observation Stay: Payer: Medicare Other

## 2017-06-29 ENCOUNTER — Emergency Department: Payer: Medicare Other

## 2017-06-29 ENCOUNTER — Other Ambulatory Visit: Payer: Self-pay

## 2017-06-29 ENCOUNTER — Telehealth: Payer: Self-pay | Admitting: Gastroenterology

## 2017-06-29 ENCOUNTER — Inpatient Hospital Stay
Admission: EM | Admit: 2017-06-29 | Discharge: 2017-07-03 | DRG: 390 | Disposition: A | Discharge status: Home / Self Care | Payer: Medicare Other | Attending: Surgery | Admitting: Surgery

## 2017-06-29 DIAGNOSIS — Z9049 Acquired absence of other specified parts of digestive tract: Secondary | ICD-10-CM

## 2017-06-29 DIAGNOSIS — K219 Gastro-esophageal reflux disease without esophagitis: Secondary | ICD-10-CM | POA: Diagnosis present

## 2017-06-29 DIAGNOSIS — K913 Postprocedural intestinal obstruction, unspecified as to partial versus complete: Secondary | ICD-10-CM

## 2017-06-29 DIAGNOSIS — Z4682 Encounter for fitting and adjustment of non-vascular catheter: Secondary | ICD-10-CM | POA: Diagnosis not present

## 2017-06-29 DIAGNOSIS — K56609 Unspecified intestinal obstruction, unspecified as to partial versus complete obstruction: Secondary | ICD-10-CM | POA: Diagnosis not present

## 2017-06-29 DIAGNOSIS — R140 Abdominal distension (gaseous): Secondary | ICD-10-CM

## 2017-06-29 DIAGNOSIS — Z79899 Other long term (current) drug therapy: Secondary | ICD-10-CM | POA: Diagnosis not present

## 2017-06-29 DIAGNOSIS — R1110 Vomiting, unspecified: Secondary | ICD-10-CM | POA: Diagnosis not present

## 2017-06-29 DIAGNOSIS — Z4659 Encounter for fitting and adjustment of other gastrointestinal appliance and device: Secondary | ICD-10-CM

## 2017-06-29 DIAGNOSIS — Z79890 Hormone replacement therapy (postmenopausal): Secondary | ICD-10-CM

## 2017-06-29 DIAGNOSIS — E039 Hypothyroidism, unspecified: Secondary | ICD-10-CM | POA: Diagnosis not present

## 2017-06-29 DIAGNOSIS — K5652 Intestinal adhesions [bands] with complete obstruction: Principal | ICD-10-CM | POA: Diagnosis present

## 2017-06-29 DIAGNOSIS — Z885 Allergy status to narcotic agent status: Secondary | ICD-10-CM

## 2017-06-29 DIAGNOSIS — R112 Nausea with vomiting, unspecified: Secondary | ICD-10-CM | POA: Diagnosis not present

## 2017-06-29 DIAGNOSIS — Z91040 Latex allergy status: Secondary | ICD-10-CM

## 2017-06-29 HISTORY — DX: Postprocedural intestinal obstruction, unspecified as to partial versus complete: K91.30

## 2017-06-29 LAB — COMPREHENSIVE METABOLIC PANEL
ALT: 19 U/L (ref 14–54)
ANION GAP: 13 (ref 5–15)
AST: 42 U/L — ABNORMAL HIGH (ref 15–41)
Albumin: 4.2 g/dL (ref 3.5–5.0)
Alkaline Phosphatase: 66 U/L (ref 38–126)
BUN: 18 mg/dL (ref 6–20)
CHLORIDE: 99 mmol/L — AB (ref 101–111)
CO2: 25 mmol/L (ref 22–32)
Calcium: 9.3 mg/dL (ref 8.9–10.3)
Creatinine, Ser: 0.62 mg/dL (ref 0.44–1.00)
GFR calc non Af Amer: >60 mL/min (ref >60)
Glucose, Bld: 200 mg/dL — ABNORMAL HIGH (ref 65–99)
POTASSIUM: 4.2 mmol/L (ref 3.5–5.1)
SODIUM: 137 mmol/L (ref 135–145)
Total Bilirubin: 1.4 mg/dL — ABNORMAL HIGH (ref 0.3–1.2)
Total Protein: 7.2 g/dL (ref 6.5–8.1)

## 2017-06-29 LAB — CBC
HCT: 42.4 % (ref 35.0–47.0)
HEMOGLOBIN: 14.4 g/dL (ref 12.0–16.0)
MCH: 30.4 pg (ref 26.0–34.0)
MCHC: 34.1 g/dL (ref 32.0–36.0)
MCV: 89.3 fL (ref 80.0–100.0)
Platelets: 222 10*3/uL (ref 150–440)
RBC: 4.75 MIL/uL (ref 3.80–5.20)
RDW: 13.6 % (ref 11.5–14.5)
WBC: 12.6 10*3/uL — ABNORMAL HIGH (ref 3.6–11.0)

## 2017-06-29 LAB — URINALYSIS, COMPLETE (UACMP) WITH MICROSCOPIC
Bacteria, UA: NONE SEEN
Bilirubin Urine: NEGATIVE
Glucose, UA: NEGATIVE mg/dL
Hgb urine dipstick: NEGATIVE
KETONES UR: NEGATIVE mg/dL
Leukocytes, UA: NEGATIVE
Nitrite: NEGATIVE
PROTEIN: NEGATIVE mg/dL
Specific Gravity, Urine: >1.046 — ABNORMAL HIGH (ref 1.005–1.030)
pH: 7 (ref 5.0–8.0)

## 2017-06-29 LAB — LIPASE, BLOOD: LIPASE: 31 U/L (ref 11–51)

## 2017-06-29 MED ORDER — KCL IN DEXTROSE-NACL 20-5-0.45 MEQ/L-%-% IV SOLN
INTRAVENOUS | Status: DC
  Administered 2017-06-29 – 2017-07-03 (×7): via INTRAVENOUS
  Filled 2017-06-29 (×12): qty 1000

## 2017-06-29 MED ORDER — ENOXAPARIN SODIUM 40 MG/0.4ML ~~LOC~~ SOLN
40.0000 mg | SUBCUTANEOUS | Status: DC
  Administered 2017-06-29 – 2017-07-02 (×3): 40 mg via SUBCUTANEOUS
  Filled 2017-06-29 (×3): qty 0.4

## 2017-06-29 MED ORDER — SODIUM CHLORIDE 0.9 % IV BOLUS
500.0000 mL | Freq: Once | INTRAVENOUS | Status: AC
  Administered 2017-06-29: 500 mL via INTRAVENOUS

## 2017-06-29 MED ORDER — IOHEXOL 300 MG/ML  SOLN
100.0000 mL | Freq: Once | INTRAMUSCULAR | Status: AC | PRN
  Administered 2017-06-29: 75 mL via INTRAVENOUS

## 2017-06-29 MED ORDER — PHENOL 1.4 % MT LIQD
1.0000 | OROMUCOSAL | Status: DC | PRN
  Filled 2017-06-29: qty 177

## 2017-06-29 MED ORDER — IOPAMIDOL (ISOVUE-300) INJECTION 61%: 100.0000 mL | Freq: Once | INTRAVENOUS | Status: DC | PRN

## 2017-06-29 MED ORDER — ORAL CARE MOUTH RINSE
15.0000 mL | Freq: Two times a day (BID) | OROMUCOSAL | Status: DC
  Administered 2017-06-29 – 2017-07-02 (×5): 15 mL via OROMUCOSAL

## 2017-06-29 MED ORDER — ONDANSETRON HCL 4 MG/2ML IJ SOLN
4.0000 mg | Freq: Once | INTRAMUSCULAR | Status: AC
  Administered 2017-06-29: 4 mg via INTRAVENOUS
  Filled 2017-06-29: qty 2

## 2017-06-29 NOTE — ED Notes (Signed)
Admitting Surgeon at bedside.

## 2017-06-29 NOTE — Telephone Encounter (Signed)
Bosie ClosJudith left vm regards to pt stating she wanted to let Dr. Tobi BastosAnna know her mother is in the Hospital for  Intestinal Blockage  Again please call her at [phone]

## 2017-06-29 NOTE — ED Triage Notes (Signed)
Per EMS, pt from [facility] with reports of emesis beginning at 11pm last night, pt reports she continued to have emesis through the night. Pt states this has happened before in the past and has had "a twist in the abdomen that resolves on its own." Pt denies abd pain at this time. EMS states pt had 2 episodes of emesis and was given 4 of zofran en route.

## 2017-06-29 NOTE — Care Management (Signed)
RNCM met with patient's daughter and son-in law (patient was toileting) to explain MOON letter. Patient is from [facility] independent living. She is independent. Her husband stays on and off in their memory care unit.  Patient may have to have NG suction per daughter which may change admit status.  No current anticipated needs.

## 2017-06-29 NOTE — ED Notes (Signed)
Pt awaits admitting MD.

## 2017-06-29 NOTE — ED Notes (Signed)
Pt denies CP, SHOB or abd pain at this time, pt states she does still feel nauseous, emesis bag at bedside, pt was given 4 of zofran with EMS.

## 2017-06-29 NOTE — H&P (Signed)
SURGICAL HISTORY & PHYSICAL (cpt [id number])  HISTORY OF PRESENT ILLNESS (HPI):  83 y.o. female presented to [facility] ED overnight for abdominal pain. Patient reports she was feeling well until she began experiencing some vague abdominal discomfort at ~9 pm last night, after which her abdominal pain worsened, becoming worst at 11 pm, with onset of nausea and subsequent non-bloody emesis. Patient states she'd been passing small amounts of flatus and BM WNL until ~24 hours prior to onset of symptoms, though she adds that she told a nurse in Dr. Johnney Killian office that her Left abdomen felt somewhat larger than her Right abdomen 10 days prior to onset of symptoms. Patient otherwise has experienced 3 SBO's over the past 1 - 2 years, most recently this past February and this past August (2018), both of which resolved with non-operative nasogastric decompression. She otherwise denies fever/chills, CP, or SOB. Since presenting to [facility] ED 4 hours ago, she has neither requested nor received pain medication or anti-emetic.  PAST MEDICAL HISTORY (PMH):  Past Medical History:  Diagnosis Date  . GERD (gastroesophageal reflux disease)   . Intractable vomiting with nausea   . Midgut volvulus   . SBO (small bowel obstruction) (HCC) 11/13/2016  . Small bowel obstruction (HCC)   . Thyroid disease     Reviewed. Otherwise negative.   PAST SURGICAL HISTORY (PSH):  Past Surgical History:  Procedure Laterality Date  . APPENDECTOMY    . PARTIAL COLECTOMY     large polyp  . TONSILLECTOMY    . TUBAL LIGATION      Reviewed. Otherwise negative.   MEDICATIONS:  Prior to Admission medications   Medication Sig Start Date End Date Taking? Authorizing Provider  cholecalciferol (VITAMIN D) 1000 units tablet Take 1,000 Units by mouth daily.   Yes [provider]  Cyanocobalamin (VITAMIN B-12) 6000 MCG SUBL Place 1 tablet under the tongue daily.   Yes [provider]  levothyroxine (SYNTHROID, LEVOTHROID) 100  MCG tablet Take 1 tablet (100 mcg total) by mouth daily. 06/19/17  Yes Karie Schwalbe, MD  Multiple Vitamins-Minerals (MULTIVITAMIN WITH MINERALS) tablet Take 1 tablet by mouth daily.   Yes [provider]  SF 5000 PLUS 1.1 % CREA dental cream APPLY A PEA-SIZED AMOUNT TO TOOTHBRUSH IN PLACE OF NORMAL PASTE; ...  (REFER TO PRESCRIPTION NOTES). 02/26/17  Yes [provider]  albuterol (PROVENTIL HFA;VENTOLIN HFA) 108 (90 Base) MCG/ACT inhaler Inhale 2 puffs into the lungs every 4 (four) hours as needed for wheezing or shortness of breath.  07/03/16 07/03/17  [provider]  omeprazole (PRILOSEC) 20 MG capsule Take 20 mg by mouth daily as needed (reflux).     [provider]  polyethylene glycol (MIRALAX / GLYCOLAX) packet Take 17 g by mouth daily.    [provider]     ALLERGIES:  Allergies  Allergen Reactions  . Codeine Nausea And Vomiting  . Latex Rash     SOCIAL HISTORY:  Social History   Socioeconomic History  . Marital status: Married    Spouse name: Not on file  . Number of children: 3  . Years of education: Not on file  . Highest education level: Not on file  Occupational History  . Occupation: Diplomatic Services operational officer  . 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 Financial resource strain: Not on file  . Food insecurity:    Worry: Not on file    Inability: Not on file  . Transportation needs:  Medical: Not on file    Non-medical: Not on file  Tobacco Use  . Smoking status: Never Smoker  . Smokeless tobacco: Never Used  Substance and Sexual Activity  . Alcohol use: Yes    Comment: rare wine   . Drug use: No  . Sexual activity: Yes  Lifestyle  . Physical activity:    Days per week: Not on file    Minutes per session: Not on file  . Stress: Not on file  Relationships  . Social connections:    Talks on phone: Not on file    Gets together: Not on file    Attends religious service: Not on file    Active member  of club or organization: Not on file    Attends meetings of clubs or organizations: Not on file    Relationship status: Not on file  . Intimate partner violence:    Fear of current or ex partner: Not on file    Emotionally abused: Not on file    Physically abused: Not on file    Forced sexual activity: Not on file  Other Topics Concern  . Not on file  Social History Narrative   2 sons, 1 daughter--- Brett Canales in [place] in [place], Selinda Orion in [place]      Has a living will   Daughter [name], then sons, should make health care decisions   Would accept resuscitation attempts   Not sure about tube feeds    The patient currently resides (home / rehab facility / nursing home): Home The patient normally is (ambulatory / bedbound): Ambulatory  FAMILY HISTORY:  Family History  Problem Relation Age of Onset  . Leukemia Mother        CLL  . Leukemia Father   . Hyperlipidemia Brother   . Stroke Brother   . Heart disease Paternal Uncle   . Diabetes Neg Hx     Otherwise negative.   REVIEW OF SYSTEMS:  Constitutional: denies any other weight loss, fever, chills, or sweats  Eyes: denies any other vision changes, history of eye injury  ENT: denies sore throat, hearing problems  Respiratory: denies shortness of breath, wheezing  Cardiovascular: denies chest pain, palpitations  Gastrointestinal: abdominal pain, N/V, and bowel function as per HPI  Genitourinary: denies burning with urination or urinary frequency Musculoskeletal: denies any other joint pains or cramps  Skin: Denies any other rashes or skin discolorations  Neurological: denies any other headache, dizziness, weakness  Psychiatric: denies any other depression, anxiety   All other review of systems were otherwise negative.  VITAL SIGNS:  Temp:  [97.8 F (36.6 C)] 97.8 F (36.6 C) (04/15 0702) Pulse Rate:  [54-81] 79 (04/15 0900) Resp:  [16-18] 16 (04/15 0900) BP: (100-134)/(58-65) 100/64 (04/15 0900) SpO2:  [95  %-98 %] 95 % (04/15 0900) Weight:  [114 lb (51.7 kg)] 114 lb (51.7 kg) (04/15 0703)     Height: 5\' 1"  (154.9 cm) Weight: 114 lb (51.7 kg) BMI (Calculated): 21.55   INTAKE/OUTPUT:  This shift: No intake/output data recorded.  Last 2 shifts: @IOLAST2SHIFTS @  PHYSICAL EXAM:  Constitutional:  -- Normal body habitus  -- Awake, alert, and oriented x3, no apparent distress Eyes:  -- Pupils equally round and reactive to light  -- No scleral icterus, B/L no occular discharge Ear, nose, throat: -- Neck is FROM WNL -- No jugular venous distension  Pulmonary:  -- No wheezes or rhales -- Equal breath sounds bilaterally -- Breathing non-labored at  rest Cardiovascular:  -- S1, S2 present  -- No pericardial rubs  Gastrointestinal:  -- Abdomen soft, completely nontender, and moderate lower abdominal distention, no guarding or rebound tenderness -- No abdominal masses appreciated, pulsatile or otherwise  Musculoskeletal and Integumentary:  -- Wounds or skin discoloration: None appreciated -- Extremities: B/L UE and LE FROM, hands and feet warm, no edema  Neurologic:  -- Motor function: Intact and symmetric -- Sensation: Intact and symmetric Psychiatric:  -- Mood and affect WNL  Labs:  CBC Latest Ref Rng & Units 06/29/2017 04/26/2017 04/25/2017  WBC 3.6 - 11.0 K/uL 12.6(H) 9.3 14.0(H)  Hemoglobin 12.0 - 16.0 g/dL 45.4 09.8 11.9  Hematocrit 35.0 - 47.0 % 42.4 39.1 40.4  Platelets 150 - 440 K/uL 222 188 216   CMP Latest Ref Rng & Units 06/29/2017 04/28/2017 04/27/2017  Glucose 65 - 99 mg/dL 147(W) 295(A) 213(Y)  BUN 6 - 20 mg/dL 18 7 9   Creatinine 0.44 - 1.00 mg/dL 8.65 7.84 6.96  Sodium 135 - 145 mmol/L 137 138 139  Potassium 3.5 - 5.1 mmol/L 4.2 3.7 3.5  Chloride 101 - 111 mmol/L 99(L) 105 104  CO2 22 - 32 mmol/L 25 27 27   Calcium 8.9 - 10.3 mg/dL 9.3 2.9(B) 2.8(U)  Total Protein 6.5 - 8.1 g/dL 7.2 - -  Total Bilirubin 0.3 - 1.2 mg/dL 1.3(K) - -  Alkaline Phos 38 - 126 U/L 66 - -  AST 15  - 41 U/L 42(H) - -  ALT 14 - 54 U/L 19 - -   Imaging studies:  CT Abdomen and Pelvis with Contrast (06/29/2017) - personally reviewed and discussed with patient and her family 1. Small bowel obstruction with transition zone in the mid ileal region, similar to prior study. No free air.  2. Periportal edema noted. Question a degree of underlying congestive heart failure or intrinsic liver disease as cause for this finding. This finding has been present previously. Prominence of the hepatic and portal veins is also noted.  3. Stable borderline prominence of the pancreatic duct. No pancreatic mass or inflammatory focus evident.  4.  No abscess.  Appendix absent.  Uterus absent.  5. Aortoiliac atherosclerosis. Calcification in the proximal major mesenteric arterial vessels noted.  MR Enterography (05/15/2017) 1. No evidence of small bowel obstruction. 2. No endoluminal lesion in the small bowel. No inflammation or stricture identified. 3. Normal duodenum and stomach. 4. Normal colon.   Assessment/Plan: (ICD-10's: K48.52) 83 y.o. 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  - NPO, IVF - monitor ongoing bowel function and abdominal exam  - will defer NG tube for now considering lack of symptoms despite no pain medication or antiemetic x4 hours  - if patient develops either abdominal pain, nausea, or emesis, have discussed and will plan to insert NG tube - anticipate symptomatic relief within 24 - 48 hours following NGT insertion, followed by "rumbling" the following day and flatus either the same day or the day following the "rumbling" with anticipated length of stay ~3 -  5 days with successful non-operative management for 8 of 10 patients with small bowel obstruction secondary to adhesions             - surgical intervention if doesn't improve was also discussed - medical management of comorbidities - ambulation encouraged - DVT prophylaxis  All of the above recommendations were discussed with the patient, her family, and ED physician, and all of patient's and her family's questions were answered to their expressed satisfaction.  -- Scherrie GerlachJason E. Earlene Plateravis, MD, RPVI [place]: [facility] General [facility] - Partnering for exceptional care. Office: [phone]

## 2017-06-29 NOTE — ED Notes (Signed)
[facility], care assumed. Pt resting in bed, continues to deny chest pain.  Pt alert and oriented. No vomiting noted at this time.

## 2017-06-29 NOTE — Care Management Obs Status (Signed)
MEDICARE OBSERVATION STATUS NOTIFICATION   Patient Details  Name: Diana Owen MRN: [id number] Date of Birth: 1934-07-01   Medicare Observation Status Notification Given:  Yes    Collie Siadngela Ruffin Lada, RN 06/29/2017, 10:50 AM

## 2017-06-29 NOTE — ED Provider Notes (Signed)
[facility] Emergency Department Provider Note  ___________________________________________   First MD Initiated Contact with Patient 06/29/17 [phone]     (approximate)  I have reviewed the triage vital signs and the nursing notes.   HISTORY  Chief Complaint Abdominal Pain and Nausea   HPI Diana Owen is a 83 y.o. female with a history of midgut volvulus with SBO who is presenting to the emergency department today with lower abdominal distention as well as vomiting multiple times since 11 PM last night.  She was given Zofran in route.  Denies any abdominal pain at this time.  Denies any chest pain or shortness of breath.  Not reporting any urinary symptoms.  No blood in the vomitus.  Past Medical History:  Diagnosis Date  . GERD (gastroesophageal reflux disease)   . Intractable vomiting with nausea   . Midgut volvulus   . SBO (small bowel obstruction) (HCC) 11/13/2016  . Small bowel obstruction (HCC)   . Thyroid disease     Patient Active Problem List   Diagnosis Date Noted  . Malnutrition of mild degree (HCC) 11/21/2016  . Midgut volvulus   . Other idiopathic scoliosis, thoracolumbar region 07/16/2016  . Oral phase dysphagia 09/26/2015  . Acid reflux 09/04/2015  . Chronic abdominal pain 09/04/2015  . Hyperlipidemia 09/04/2015  . Hypothyroidism 09/04/2015    Past Surgical History:  Procedure Laterality Date  . APPENDECTOMY    . PARTIAL COLECTOMY     large polyp  . TONSILLECTOMY    . TUBAL LIGATION      Prior to Admission medications   Medication Sig Start Date End Date Taking? Authorizing Provider  albuterol (PROVENTIL HFA;VENTOLIN HFA) 108 (90 Base) MCG/ACT inhaler Inhale 2 puffs into the lungs every 4 (four) hours as needed for wheezing or shortness of breath.  07/03/16 07/03/17  [provider]  cholecalciferol (VITAMIN D) 1000 units tablet Take 1,000 Units by mouth daily.    [provider]  Cyanocobalamin (VITAMIN B-12)  6000 MCG SUBL Place 1 tablet under the tongue daily.    [provider]  levothyroxine (SYNTHROID, LEVOTHROID) 100 MCG tablet Take 1 tablet (100 mcg total) by mouth daily. 06/19/17   Karie Schwalbe, MD  Multiple Vitamins-Minerals (MULTIVITAMIN WITH MINERALS) tablet Take 1 tablet by mouth daily.    [provider]  omeprazole (PRILOSEC) 20 MG capsule Take 20 mg by mouth daily as needed (reflux).     [provider]  polyethylene glycol (MIRALAX / GLYCOLAX) packet Take 17 g by mouth daily.    [provider]  SF 5000 PLUS 1.1 % CREA dental cream APPLY A PEA-SIZED AMOUNT TO TOOTHBRUSH IN PLACE OF NORMAL PASTE; ...  (REFER TO PRESCRIPTION NOTES). 02/26/17   [provider]    Allergies Codeine and Latex  Family History  Problem Relation Age of Onset  . Leukemia Mother        CLL  . Leukemia Father   . Hyperlipidemia Brother   . Stroke Brother   . Heart disease Paternal Uncle   . Diabetes Neg Hx     Social History Social History   Tobacco Use  . Smoking status: Never Smoker  . Smokeless tobacco: Never Used  Substance Use Topics  . Alcohol use: Yes    Comment: rare wine   . Drug use: No    Review of Systems  Constitutional: No fever/chills Eyes: No visual changes. ENT: No sore throat. Cardiovascular: Denies chest pain. Respiratory: Denies shortness of breath.  Gastrointestinal: No abdominal pain.   No diarrhea.  No constipation. Genitourinary: Negative for dysuria. Musculoskeletal: Negative for back pain. Skin: Negative for rash. Neurological: Negative for headaches, focal weakness or numbness.   ____________________________________________   PHYSICAL EXAM:  VITAL SIGNS: ED Triage Vitals  Enc Vitals Group     BP 06/29/17 0702 134/65     Pulse Rate 06/29/17 0702 (!) 54     Resp 06/29/17 0702 18     Temp 06/29/17 0702 97.8 F (36.6 C)     Temp Source 06/29/17 0702 Oral     SpO2 06/29/17 0702 98 %     Weight 06/29/17  0703 114 lb (51.7 kg)     Height 06/29/17 0703 5\' 1"  (1.549 m)     Head Circumference --      Peak Flow --      Pain Score 06/29/17 0703 0     Pain Loc --      Pain Edu? --      Excl. in GC? --     Constitutional: Alert and oriented. Well appearing and in no acute distress. Eyes: Conjunctivae are normal.  Head: Atraumatic. Nose: No congestion/rhinnorhea. Mouth/Throat: Mucous membranes are moist.  Neck: No stridor.   Cardiovascular: Normal rate, regular rhythm. Grossly normal heart sounds.   Respiratory: Normal respiratory effort.  No retractions. Lungs CTAB. Gastrointestinal: Soft and nontender.  Lower abdominal distention without tenderness to palpation. Musculoskeletal: No lower extremity tenderness nor edema.  No joint effusions. Neurologic:  Normal speech and language. No gross focal neurologic deficits are appreciated. Skin:  Skin is warm, dry and intact. No rash noted. Psychiatric: Mood and affect are normal. Speech and behavior are normal.  ____________________________________________   LABS (all labs ordered are listed, but only abnormal results are displayed)  Labs Reviewed  COMPREHENSIVE METABOLIC PANEL - Abnormal; Notable for the following components:      Result Value   Chloride 99 (*)    Glucose, Bld 200 (*)    AST 42 (*)    Total Bilirubin 1.4 (*)    All other components within normal limits  CBC - Abnormal; Notable for the following components:   WBC 12.6 (*)    All other components within normal limits  LIPASE, BLOOD  URINALYSIS, COMPLETE (UACMP) WITH MICROSCOPIC   ____________________________________________  EKG   ____________________________________________  RADIOLOGY  Small bowel obstruction ____________________________________________   PROCEDURES  Procedure(s) performed:   Procedures  Critical Care performed:   ____________________________________________   INITIAL IMPRESSION / ASSESSMENT AND PLAN / ED COURSE  Pertinent labs &  imaging results that were available during my care of the patient were reviewed by me and considered in my medical decision making (see chart for details).  Differential diagnosis includes, but is not limited to, acute appendicitis, renal colic, testicular torsion, urinary tract infection/pyelonephritis, prostatitis,  epididymitis, diverticulitis, small bowel obstruction or ileus, colitis, abdominal aortic aneurysm, gastroenteritis, hernia, etc. As part of my medical decision making, I reviewed the following data within the electronic MEDICAL RECORD NUMBER Notes from prior ED visits  ----------------------------------------- 8:27 AM on 06/29/2017 -----------------------------------------  Patient not actively vomiting at this time.  Found to have small bowel obstruction on the CAT scan.  Patient to be admitted to the hospital.  Signed out to Dr. Earlene Plater.  Patient is understanding of the plan and willing to comply.  I will hold on NG tube for now as the patient is, not actively vomiting.  No abdominal pain. ____________________________________________   FINAL CLINICAL IMPRESSION(S) /  ED DIAGNOSES  Small bowel obstruction    NEW MEDICATIONS STARTED DURING THIS VISIT:  New Prescriptions   No medications on file     Note:  This document was prepared using Dragon voice recognition software and may include unintentional dictation errors.     Myrna BlazerSchaevitz, Deanza Upperman Matthew, MD 06/29/17 [id number]

## 2017-06-29 NOTE — Progress Notes (Signed)
MD aware of increased nausea. Orders for IV zofran and NGT to LIS for decompression.

## 2017-06-30 ENCOUNTER — Observation Stay: Payer: Medicare Other

## 2017-06-30 DIAGNOSIS — Z9049 Acquired absence of other specified parts of digestive tract: Secondary | ICD-10-CM | POA: Diagnosis not present

## 2017-06-30 DIAGNOSIS — K913 Postprocedural intestinal obstruction, unspecified as to partial versus complete: Secondary | ICD-10-CM | POA: Diagnosis not present

## 2017-06-30 DIAGNOSIS — Z79899 Other long term (current) drug therapy: Secondary | ICD-10-CM | POA: Diagnosis not present

## 2017-06-30 DIAGNOSIS — R140 Abdominal distension (gaseous): Secondary | ICD-10-CM | POA: Diagnosis not present

## 2017-06-30 DIAGNOSIS — Z91040 Latex allergy status: Secondary | ICD-10-CM | POA: Diagnosis not present

## 2017-06-30 DIAGNOSIS — K5652 Intestinal adhesions [bands] with complete obstruction: Secondary | ICD-10-CM | POA: Diagnosis present

## 2017-06-30 DIAGNOSIS — Z885 Allergy status to narcotic agent status: Secondary | ICD-10-CM | POA: Diagnosis not present

## 2017-06-30 DIAGNOSIS — K56609 Unspecified intestinal obstruction, unspecified as to partial versus complete obstruction: Secondary | ICD-10-CM | POA: Diagnosis not present

## 2017-06-30 DIAGNOSIS — K219 Gastro-esophageal reflux disease without esophagitis: Secondary | ICD-10-CM | POA: Diagnosis present

## 2017-06-30 DIAGNOSIS — Z79890 Hormone replacement therapy (postmenopausal): Secondary | ICD-10-CM | POA: Diagnosis not present

## 2017-06-30 DIAGNOSIS — E039 Hypothyroidism, unspecified: Secondary | ICD-10-CM | POA: Diagnosis present

## 2017-06-30 LAB — CBC
HCT: 41.9 % (ref 35.0–47.0)
Hemoglobin: 14.3 g/dL (ref 12.0–16.0)
MCH: 30.8 pg (ref 26.0–34.0)
MCHC: 34.1 g/dL (ref 32.0–36.0)
MCV: 90.3 fL (ref 80.0–100.0)
PLATELETS: 223 10*3/uL (ref 150–440)
RBC: 4.64 MIL/uL (ref 3.80–5.20)
RDW: 13.7 % (ref 11.5–14.5)
WBC: 9.2 10*3/uL (ref 3.6–11.0)

## 2017-06-30 LAB — BASIC METABOLIC PANEL
Anion gap: 5 (ref 5–15)
BUN: 13 mg/dL (ref 6–20)
CALCIUM: 8.6 mg/dL — AB (ref 8.9–10.3)
CO2: 30 mmol/L (ref 22–32)
CREATININE: 0.59 mg/dL (ref 0.44–1.00)
Chloride: 101 mmol/L (ref 101–111)
GFR calc non Af Amer: >60 mL/min (ref >60)
Glucose, Bld: 112 mg/dL — ABNORMAL HIGH (ref 65–99)
Potassium: 3.5 mmol/L (ref 3.5–5.1)
SODIUM: 136 mmol/L (ref 135–145)

## 2017-06-30 NOTE — Progress Notes (Signed)
SURGICAL PROGRESS NOTE (cpt [phone])  Hospital Day(s): 0.   Post op day(s):  Marland Kitchen   Interval History: Patient seen and examined, no acute events or new complaints overnight. Patient reports she passed a small amount of flatus with complete resolution of abdominal pain, distention, and N/V since NG tube inserted, denies fever/chills, CP, or SOB.  Review of Systems:  Constitutional: denies fever, chills  HEENT: denies cough or congestion  Respiratory: denies any shortness of breath  Cardiovascular: denies chest pain or palpitations  Gastrointestinal: abdominal pain, N/V, and bowel function as per interval history Genitourinary: denies burning with urination or urinary frequency Musculoskeletal: denies pain, decreased motor or sensation Integumentary: denies any other rashes or skin discolorations Neurological: denies HA or vision/hearing changes   Vital signs in last 24 hours: [min-max] current  Temp:  [98.2 F (36.8 C)-98.9 F (37.2 C)] 98.9 F (37.2 C) (04/16 0441) Pulse Rate:  [66-70] 70 (04/15 2008) Resp:  [18-19] 19 (04/16 0441) BP: (97-108)/(56-57) 108/57 (04/16 0441) SpO2:  [92 %-96 %] 92 % (04/16 0441)     Height: 5\' 1"  (154.9 cm) Weight: 114 lb (51.7 kg) BMI (Calculated): 21.55   Intake/Output this shift:  Total I/O In: 460 [I.V.:460] Out: 600 [Urine:500; Emesis/NG output:100]   Intake/Output last 2 shifts:  @IOLAST2SHIFTS @   Physical Exam:  Constitutional: alert, cooperative and no distress  HENT: normocephalic without obvious abnormality  Eyes: PERRL, EOM's grossly intact and symmetric  Neuro: CN II - XII grossly intact and symmetric without deficit  Respiratory: breathing non-labored at rest  Cardiovascular: regular rate and sinus rhythm  Gastrointestinal: soft, non-tender, and non-distended Musculoskeletal: UE and LE FROM, no edema or wounds, motor and sensation grossly intact, NT   Labs:  CBC Latest Ref Rng & Units 06/30/2017 06/29/2017 04/26/2017  WBC 3.6 -  11.0 K/uL 9.2 12.6(H) 9.3  Hemoglobin 12.0 - 16.0 g/dL 19.1 47.8 29.5  Hematocrit 35.0 - 47.0 % 41.9 42.4 39.1  Platelets 150 - 440 K/uL 223 222 188   CMP Latest Ref Rng & Units 06/30/2017 06/29/2017 04/28/2017  Glucose 65 - 99 mg/dL 621(H) 086(V) 784(O)  BUN 6 - 20 mg/dL 13 18 7   Creatinine 0.44 - 1.00 mg/dL 9.62 9.52 8.41  Sodium 135 - 145 mmol/L 136 137 138  Potassium 3.5 - 5.1 mmol/L 3.5 4.2 3.7  Chloride 101 - 111 mmol/L 101 99(L) 105  CO2 22 - 32 mmol/L 30 25 27   Calcium 8.9 - 10.3 mg/dL 3.2(G) 9.3 4.0(N)  Total Protein 6.5 - 8.1 g/dL - 7.2 -  Total Bilirubin 0.3 - 1.2 mg/dL - 0.2(V) -  Alkaline Phos 38 - 126 U/L - 66 -  AST 15 - 41 U/L - 42(H) -  ALT 14 - 54 U/L - 19 -   Imaging studies: No new pertinent imaging studies   Assessment/Plan: (ICD-10's: K56.52) 83 y.o.femalewith recurrent complete small bowel obstruction, likely attributable to post-surgical adhesions followingpartial colectomy for a polyp, appendectomy, and tubal ligation, complicated by pertinent comorbidities includingthyroid disease (not otherwise specified), GERD, and 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  - NPO with IV fluids - continue nasogastric decompression for now - monitor ongoing bowel function and abdominal exam - anticipate symptomatic relief within 24 - 48 hours following NGT insertion, followed by "rumbling" the following day and flatus either the same day or the day following the "rumbling" with anticipated length of stay ~3 - 5 days with successful non-operative management for 8 of 10 patients with small bowel obstruction secondary to adhesions  -  outpatient surgical follow-up +/- elective lysis of adhesions for short-interval recurrent SBO's was discussed - surgical intervention if doesn't improve was also discussed - medical management of comorbidities - ambulation encouraged - DVT  prophylaxis  All of the above recommendations were discussed with the patient and her family, and all of patient's and her family's questions were answered to their expressed satisfaction.  -- Scherrie GerlachJason E. Earlene Plateravis, MD, RPVI [place]: [facility] - Partnering for exceptional care. Office: [phone]

## 2017-07-01 LAB — BASIC METABOLIC PANEL
Anion gap: 6 (ref 5–15)
BUN: 10 mg/dL (ref 6–20)
CHLORIDE: 103 mmol/L (ref 101–111)
CO2: 29 mmol/L (ref 22–32)
CREATININE: 0.53 mg/dL (ref 0.44–1.00)
Calcium: 8.7 mg/dL — ABNORMAL LOW (ref 8.9–10.3)
GFR calc Af Amer: >60 mL/min (ref >60)
GFR calc non Af Amer: >60 mL/min (ref >60)
Glucose, Bld: 107 mg/dL — ABNORMAL HIGH (ref 65–99)
Potassium: 3.7 mmol/L (ref 3.5–5.1)
SODIUM: 138 mmol/L (ref 135–145)

## 2017-07-01 NOTE — Progress Notes (Signed)
SURGICAL PROGRESS NOTE (cpt [phone])  Hospital Day(s): 1.   Post op day(s):  Marland Kitchen   Interval History: Patient seen and examined, no acute events or new complaints overnight. Patient reports complete resolution of her abdominal pain, nausea, and distention with +flatus and a small BM, though NG tube has drained an additional 800 mL+ since yesterday evening. She otherwise denies any fever/chills, CP, or SOB.  Review of Systems:  Constitutional: denies fever, chills  HEENT: denies cough or congestion  Respiratory: denies any shortness of breath  Cardiovascular: denies chest pain or palpitations  Gastrointestinal: abdominal pain, N/V, and bowel function as per interval history Genitourinary: denies burning with urination or urinary frequency Musculoskeletal: denies pain, decreased motor or sensation Integumentary: denies any other rashes or skin discolorations Neurological: denies HA or vision/hearing changes   Vital signs in last 24 hours: [min-max] current  Temp:  [97.9 F (36.6 C)-98.2 F (36.8 C)] 97.9 F (36.6 C) (04/17 0457) Pulse Rate:  [55-72] 59 (04/17 0457) Resp:  [18-20] 20 (04/17 0457) BP: (105-116)/(55-72) 116/55 (04/17 0457) SpO2:  [92 %-97 %] 92 % (04/17 0457)     Height: 5\' 1"  (154.9 cm) Weight: 114 lb (51.7 kg) BMI (Calculated): 21.55   Intake/Output this shift:  Total I/O In: -  Out: 300 [Urine:300]   Intake/Output last 2 shifts:  @IOLAST2SHIFTS @   Physical Exam:  Constitutional: alert, cooperative and no distress  HENT: normocephalic without obvious abnormality  Eyes: PERRL, EOM's grossly intact and symmetric  Neuro: CN II - XII grossly intact and symmetric without deficit  Respiratory: breathing non-labored at rest  Cardiovascular: regular rate and sinus rhythm  Gastrointestinal: soft, completely non-tender and non-distended Musculoskeletal: UE and LE FROM, no edema or wounds, motor and sensation grossly intact, NT   Labs:  CBC Latest Ref Rng & Units  06/30/2017 06/29/2017 04/26/2017  WBC 3.6 - 11.0 K/uL 9.2 12.6(H) 9.3  Hemoglobin 12.0 - 16.0 g/dL 78.2 95.6 21.3  Hematocrit 35.0 - 47.0 % 41.9 42.4 39.1  Platelets 150 - 440 K/uL 223 222 188   CMP Latest Ref Rng & Units 07/01/2017 06/30/2017 06/29/2017  Glucose 65 - 99 mg/dL 086(V) 784(O) 962(X)  BUN 6 - 20 mg/dL 10 13 18   Creatinine 0.44 - 1.00 mg/dL 5.28 4.13 2.44  Sodium 135 - 145 mmol/L 138 136 137  Potassium 3.5 - 5.1 mmol/L 3.7 3.5 4.2  Chloride 101 - 111 mmol/L 103 101 99(L)  CO2 22 - 32 mmol/L 29 30 25   Calcium 8.9 - 10.3 mg/dL 0.1(U) 2.7(O) 9.3  Total Protein 6.5 - 8.1 g/dL - - 7.2  Total Bilirubin 0.3 - 1.2 mg/dL - - 1.4(H)  Alkaline Phos 38 - 126 U/L - - 66  AST 15 - 41 U/L - - 42(H)  ALT 14 - 54 U/L - - 19   Imaging studies: No new pertinent imaging studies   Assessment/Plan: (ICD-10's: K56.52) 83y.o.femalewithrecurrentcomplete small bowel obstruction, likely attributable to post-surgical adhesions followingpartial colectomy for a polyp, appendectomy, and tubal ligation, complicated by pertinent comorbidities includingthyroid disease (not otherwise specified), GERD, and 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  - NPO with IV fluids - monitor ongoing bowel function and abdominal exam - considering nearly 1 L drained over 24 hours, continue nasogastric decompression for now - anticipate removal of NG tube and initiation of clear liquids diet if patient and NG tube drainage continue to improve             - outpatient surgical follow-up +/- elective lysis of  adhesions for short-interval recurrent SBO's was discussed - surgical intervention if doesn't improve was also discussed - medical management ofcomorbidities - ambulation encouraged - DVT prophylaxis  All of the above recommendations were discussed with the patient and herfamily, and all of patient's and her family's  questions were answered to their expressed satisfaction.  -- Scherrie GerlachJason E. Earlene Plateravis, MD, RPVI [place]: [facility] - Partnering for exceptional care. Office: [phone]

## 2017-07-02 ENCOUNTER — Inpatient Hospital Stay: Payer: Medicare Other

## 2017-07-02 LAB — BASIC METABOLIC PANEL
ANION GAP: 5 (ref 5–15)
BUN: 17 mg/dL (ref 6–20)
CALCIUM: 8.5 mg/dL — AB (ref 8.9–10.3)
CO2: 29 mmol/L (ref 22–32)
CREATININE: 0.52 mg/dL (ref 0.44–1.00)
Chloride: 105 mmol/L (ref 101–111)
GLUCOSE: 122 mg/dL — AB (ref 65–99)
Potassium: 3.4 mmol/L — ABNORMAL LOW (ref 3.5–5.1)
Sodium: 139 mmol/L (ref 135–145)

## 2017-07-02 NOTE — Telephone Encounter (Signed)
I went and spoke to the patient and family in the hospital yesterday ,.

## 2017-07-02 NOTE — Progress Notes (Addendum)
ADDENDUM: Patient continues to feel well, same as this morning and yesterday with +flatus and +BM.  NG tube drainage: 200 mL over the past 8 hours (much less than previously)  Abdominal X-ray (07/02/2017) - personally reviewed and discussed with patient Enteric tube terminates in the region of the gastric body, not significantly changed. Gas and stool are present in nondilated colon, and there is an anastomotic staple line in the rectal region. Scattered small bowel gas is present, however the dilated pelvic small bowel loops on the prior study are no longer seen. No intraperitoneal free air is identified on this supine study. Lumbar levoscoliosis is noted.   - will continue to monitor considering +flatus, +BM, decreasing NG tube drainage, and reassuring AXR  - in particular, will plan to remove NG tube and start clear liquids if drainage continues to decrease  -- Barbara CowerJason E. Earlene Plateravis, MD, RPVI [place]: [facility] - Partnering for exceptional care. Office: [phone]       SURGICAL PROGRESS NOTE (cpt [id number])  Hospital Day(s): 2.   Post op day(s):  Marland Kitchen.   Interval History: Patient seen and examined, no acute events or new complaints overnight. Patient reports she continues to pass flatus and a large BM this morning, denies abdominal pain or nausea/vomiting, though NG tube has drained nearly another Liter of bilious fluid over the past 12 hours. Patient otherwise denies fever/chills, CP, or SOB.  Review of Systems:  Constitutional: denies fever, chills  HEENT: denies cough or congestion  Respiratory: denies any shortness of breath  Cardiovascular: denies chest pain or palpitations  Gastrointestinal: abdominal pain, N/V, and bowel function as per interval history Genitourinary: denies burning with urination or urinary frequency Musculoskeletal: denies pain, decreased motor or sensation Integumentary: denies any other rashes or skin  discolorations Neurological: denies HA or vision/hearing changes   Vital signs in last 24 hours: [min-max] current  Temp:  [98.1 F (36.7 C)-98.2 F (36.8 C)] 98.1 F (36.7 C) (04/18 1201) Pulse Rate:  [74-91] 91 (04/18 1201) Resp:  [16-18] 16 (04/18 1201) BP: (105-111)/(62-92) 105/92 (04/18 1201) SpO2:  [94 %-95 %] 95 % (04/18 1201)     Height: 5\' 1"  (154.9 cm) Weight: 114 lb (51.7 kg) BMI (Calculated): 21.55   Intake/Output this shift:  Total I/O In: 1138 [I.V.:958; NG/GT:180] Out: 750 [Emesis/NG output:750]   Intake/Output last 2 shifts:  @IOLAST2SHIFTS @   Physical Exam:  Constitutional: alert, cooperative and no distress  HENT: normocephalic without obvious abnormality  Eyes: PERRL, EOM's grossly intact and symmetric  Neuro: CN II - XII grossly intact and symmetric without deficit  Respiratory: breathing non-labored at rest  Cardiovascular: regular rate and sinus rhythm  Gastrointestinal: soft, completely non-tender and non-distended Musculoskeletal: UE and LE FROM, no edema or wounds, motor and sensation grossly intact, NT   Labs:  CBC Latest Ref Rng & Units 06/30/2017 06/29/2017 04/26/2017  WBC 3.6 - 11.0 K/uL 9.2 12.6(H) 9.3  Hemoglobin 12.0 - 16.0 g/dL 91.414.3 78.214.4 95.613.4  Hematocrit 35.0 - 47.0 % 41.9 42.4 39.1  Platelets 150 - 440 K/uL 223 222 188   CMP Latest Ref Rng & Units 07/02/2017 07/01/2017 06/30/2017  Glucose 65 - 99 mg/dL 213(Y122(H) 865(H107(H) 846(N112(H)  BUN 6 - 20 mg/dL 17 10 13   Creatinine 0.44 - 1.00 mg/dL 6.290.52 5.280.53 4.130.59  Sodium 135 - 145 mmol/L 139 138 136  Potassium 3.5 - 5.1 mmol/L 3.4(L) 3.7 3.5  Chloride 101 - 111 mmol/L 105 103 101  CO2 22 - 32  mmol/L 29 29 30   Calcium 8.9 - 10.3 mg/dL 2.4(M) 0.1(U) 2.7(O)  Total Protein 6.5 - 8.1 g/dL - - -  Total Bilirubin 0.3 - 1.2 mg/dL - - -  Alkaline Phos 38 - 126 U/L - - -  AST 15 - 41 U/L - - -  ALT 14 - 54 U/L - - -   Imaging studies: No new pertinent imaging studies   Assessment/Plan: (ICD-10's:  K56.52) 83y.o.femalewithclinically improved recurrentcomplete small bowel obstruction, likely attributable to post-surgical adhesions followingpartial colectomy for a polyp, appendectomy, and tubal ligation, but persistent NG drainage, complicated by pertinent comorbidities includingthyroid disease (not otherwise specified), GERD, and 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  - NPO withIV fluids, nasogastric decompression - monitor ongoing bowel function and abdominal exam - surgical intervention if not continuing to improve wasdiscussed - considering conflicting clinical improvement and persistent high-volume NG tube drainage, will check abdominal x-ray - outpatient surgical follow-up +/- elective lysis of adhesions for short-interval recurrent SBO's was also discussed - medical management ofcomorbidities - ambulation encouraged - DVT prophylaxis  All of the above recommendations were discussed with the patientandherfamily, and all of patient's and her family's questions were answered to their expressed satisfaction.  -- Scherrie Gerlach Earlene Plater, MD, RPVI [place]: [facility] - Partnering for exceptional care. Office: [phone]

## 2017-07-03 NOTE — Progress Notes (Signed)
Diana Owen to be D/C'd home per MD order.  Discussed prescriptions and follow up appointments with the patient. Prescriptions given to patient, medication list explained in detail. Pt verbalized understanding.  Allergies as of 07/03/2017      Reactions   Codeine Nausea And Vomiting   Latex Rash      Medication List    TAKE these medications   albuterol 108 (90 Base) MCG/ACT inhaler Commonly known as:  PROVENTIL HFA;VENTOLIN HFA Inhale 2 puffs into the lungs every 4 (four) hours as needed for wheezing or shortness of breath.   cholecalciferol 1000 units tablet Commonly known as:  VITAMIN D Take 1,000 Units by mouth daily.   levothyroxine 100 MCG tablet Commonly known as:  SYNTHROID, LEVOTHROID Take 1 tablet (100 mcg total) by mouth daily.   multivitamin with minerals tablet Take 1 tablet by mouth daily.   omeprazole 20 MG capsule Commonly known as:  PRILOSEC Take 20 mg by mouth daily as needed (reflux).   polyethylene glycol packet Commonly known as:  MIRALAX / GLYCOLAX Take 17 g by mouth daily.   SF 5000 PLUS 1.1 % Crea dental cream Generic drug:  sodium fluoride APPLY Owen PEA-SIZED AMOUNT TO TOOTHBRUSH IN PLACE OF NORMAL PASTE; ...  (REFER TO PRESCRIPTION NOTES).   Vitamin B-12 6000 MCG Subl Place 1 tablet under the tongue daily.       Vitals:   07/03/17 0637 07/03/17 1154  BP: 94/67 (!) 95/58  Pulse: 73 86  Resp: 16 16  Temp: 98.3 F (36.8 C) 97.6 F (36.4 C)  SpO2: 93% 96%    Skin clean, dry and intact without evidence of skin break down, no evidence of skin tears noted. IV catheter discontinued intact. Site without signs and symptoms of complications. Dressing and pressure applied. Pt denies pain at this time. No complaints noted.  An After Visit Summary was printed and given to the patient. Patient escorted via WC, and D/C home via private auto.  Diana Owen

## 2017-07-03 NOTE — Progress Notes (Addendum)
ADDENDUM: Patient reassessed, tolerating her diet, ambulating, passing +flatus and +BM WNL, denies abdominal pain, N/V, fever/chills, CP, or SOB. Patient's abdomen appears soft, NT, and ND on exam. Patient and her family request for her to return home and sleep in her own bed tonight, express understanding of signs and symptoms of persistent/recurrent small bowel obstruction and agree to call/return if questions/concerns. Alternatively, further observation also discussed, need for surgery if recurs/persists.  Discussed with patient, her family, and her RN, and all of patient's and family's questions answered.  Plans, management, and follow-up otherwise as previously described.  -- Scherrie GerlachJason E. Earlene Plateravis, MD, RPVI [place]: [facility] - Partnering for exceptional care. Office: [phone]       SURGICAL PROGRESS NOTE (cpt [id number])  Hospital Day(s): 3.   Post op day(s):  Marland Kitchen.   Interval History: Patient seen and examined, reports NG tube fell out overnight, since which she's continued to pass flatus and passed a second BM this morning, denies any recurrence of abdominal pain, N/V, fever/chills, CP, or SOB.  Review of Systems:  Constitutional: denies fever, chills  HEENT: denies cough or congestion  Respiratory: denies any shortness of breath  Cardiovascular: denies chest pain or palpitations  Gastrointestinal: abdominal pain, N/V, and bowel function as per interval history Genitourinary: denies burning with urination or urinary frequency Musculoskeletal: denies pain, decreased motor or sensation Integumentary: denies any other rashes or skin discolorations Neurological: denies HA or vision/hearing changes   Vital signs in last 24 hours: [min-max] current  Temp:  [98.1 F (36.7 C)-99.1 F (37.3 C)] 98.3 F (36.8 C) (04/19 0637) Pulse Rate:  [71-91] 73 (04/19 0637) Resp:  [16-18] 16 (04/19 0637) BP: (94-105)/(67-92) 94/67 (04/19 0637) SpO2:  [93 %-95 %]  93 % (04/19 0637)     Height: 5\' 1"  (154.9 cm) Weight: 114 lb (51.7 kg) BMI (Calculated): 21.55   Intake/Output this shift:  No intake/output data recorded.   Intake/Output last 2 shifts:  @IOLAST2SHIFTS @   Physical Exam:  Constitutional: alert, cooperative and no distress  HENT: normocephalic without obvious abnormality  Eyes: PERRL, EOM's grossly intact and symmetric  Neuro: CN II - XII grossly intact and symmetric without deficit  Respiratory: breathing non-labored at rest  Cardiovascular: regular rate and sinus rhythm  Gastrointestinal: soft, completely non-tender, and non-distended Musculoskeletal: UE and LE FROM, no edema or wounds, motor and sensation grossly intact, NT   Labs:  CBC Latest Ref Rng & Units 06/30/2017 06/29/2017 04/26/2017  WBC 3.6 - 11.0 K/uL 9.2 12.6(H) 9.3  Hemoglobin 12.0 - 16.0 g/dL 64.414.3 03.414.4 74.213.4  Hematocrit 35.0 - 47.0 % 41.9 42.4 39.1  Platelets 150 - 440 K/uL 223 222 188   CMP Latest Ref Rng & Units 07/02/2017 07/01/2017 06/30/2017  Glucose 65 - 99 mg/dL 595(G122(H) 387(F107(H) 643(P112(H)  BUN 6 - 20 mg/dL 17 10 13   Creatinine 0.44 - 1.00 mg/dL 2.950.52 1.880.53 4.160.59  Sodium 135 - 145 mmol/L 139 138 136  Potassium 3.5 - 5.1 mmol/L 3.4(L) 3.7 3.5  Chloride 101 - 111 mmol/L 105 103 101  CO2 22 - 32 mmol/L 29 29 30   Calcium 8.9 - 10.3 mg/dL 6.0(Y8.5(L) 3.0(Z8.7(L) 6.0(F8.6(L)  Total Protein 6.5 - 8.1 g/dL - - -  Total Bilirubin 0.3 - 1.2 mg/dL - - -  Alkaline Phos 38 - 126 U/L - - -  AST 15 - 41 U/L - - -  ALT 14 - 54 U/L - - -    Imaging studies: No new pertinent  imaging studies   Assessment/Plan: (ICD-10's: K56.52) 83y.o.femalewithclinically and radiographically improved recurrentcomplete small bowel obstruction, attributable to post-surgical adhesions followingpartial colectomy, appendectomy, and tubal ligation, but persistent NG drainage, complicated by pertinent comorbidities includingthyroid disease (not otherwise specified), GERD, and prior concern for midgut  volvulus.  - clear liquids diet ordered, will advance if tolerates - monitor ongoing bowel function and abdominal exam - outpatient surgical follow-up +/- elective lysis of adhesions for short-interval recurrent SBO's was also discussed - medical management of medicalcomorbidities - ambulation encouraged,DVT prophylaxis  - discharge planning  All of the above recommendations were discussed with the patientandherfamily, and all of patient's and her family's questions were answered to their expressed satisfaction.  -- Scherrie Gerlach Earlene Plater, MD, RPVI [place]: [facility] - Partnering for exceptional care. Office: [phone]

## 2017-07-03 NOTE — Care Management Important Message (Signed)
Important Message  Patient Details  Name: Diana ReelMarie Archibald MRN: [id number] Date of Birth: 1934/05/23   Medicare Important Message Given:  Yes    Olegario MessierKathy A Kortnee Bas 07/03/2017, 12:10 PM

## 2017-07-05 NOTE — Discharge Summary (Signed)
Physician Discharge Summary  Patient ID: Diana Owen MRN: [id number] DOB/AGE: [birth year] 83 y.o.  Admit date: 06/29/2017 Discharge date: 07/05/2017  Admission Diagnoses:  Discharge Diagnoses:  Active Problems:   Small bowel obstruction due to postoperative adhesions   SBO (small bowel obstruction) (HCC)   Discharged Condition: good  Hospital Course: 83 y.o. female presented to [facility] ED for abdominal pain with distention and N/V. Workup was found to be significant for CT imaging demonstrating recurrent SBO. However, when seen evaluated by surgery in the ED, patient reported complete resolution of her abdominal pain and nausea without any pain medication or antiemetic medication >4 hours despite no flatus or BM during same time. Accordingly, patient was admitted without NG tube and am follow-up abdominal x-ray, but patient developed nausea, for which NG tube was placed. Shortly thereafter, patient began reporting +flatus and subsequent BM, but her NG tube output remained high. Abdominal x-ray demonstrated resolution of SBO, and NG tube output sharply decreased. NG tube then "fell out" and was left out. While patient continues to describe Left-sided abdominal "fullness", she tolerated advancement of her diet with ongoing +flatus and +BM WNL, denies any abdominal pain or N/V, and she and her family express wishes to return home and understanding of indications to return and management options. Accordingly, discharge planning was initiated with patient able to be discharged home with appropriate discharge instructions, and offered outpatient surgical follow-up after all of her and her family's questions were answered to their expressed satisfaction.  Consults: None  Significant Diagnostic Studies: radiology: CT scan: SBO  Treatments: IV hydration and procedures: nasogastric decompression with NG tube  Discharge Exam: Blood pressure (!) 95/58, pulse 86, temperature 97.6 F (36.4 C), temperature  source Oral, resp. rate 16, height 5\' 1"  (1.549 m), weight 114 lb (51.7 kg), SpO2 96 %. General appearance: alert, cooperative, appears stated age, no distress and somewhat forgetful, but cheerful GI: soft, non-tender; bowel sounds normal; no masses,  no organomegaly  Disposition:    Allergies as of 07/03/2017      Reactions   Codeine Nausea And Vomiting   Latex Rash      Medication List    TAKE these medications   albuterol 108 (90 Base) MCG/ACT inhaler Commonly known as:  PROVENTIL HFA;VENTOLIN HFA Inhale 2 puffs into the lungs every 4 (four) hours as needed for wheezing or shortness of breath.   cholecalciferol 1000 units tablet Commonly known as:  VITAMIN D Take 1,000 Units by mouth daily.   levothyroxine 100 MCG tablet Commonly known as:  SYNTHROID, LEVOTHROID Take 1 tablet (100 mcg total) by mouth daily.   multivitamin with minerals tablet Take 1 tablet by mouth daily.   omeprazole 20 MG capsule Commonly known as:  PRILOSEC Take 20 mg by mouth daily as needed (reflux).   polyethylene glycol packet Commonly known as:  MIRALAX / GLYCOLAX Take 17 g by mouth daily.   SF 5000 PLUS 1.1 % Crea dental cream Generic drug:  sodium fluoride APPLY A PEA-SIZED AMOUNT TO TOOTHBRUSH IN PLACE OF NORMAL PASTE; ...  (REFER TO PRESCRIPTION NOTES).   Vitamin B-12 6000 MCG Subl Place 1 tablet under the tongue daily.      Follow-up Information    Ancil Linsey, MD. Schedule an appointment as soon as possible for a visit.   Specialty:  General Surgery Why:  Call to schedule outpatient surgical follow-up as discussed / as needed. Contact information: [address] [place] [address] [phone]  Signed: Ancil LinseyJason Evan Darlyn Repsher 07/05/2017, 5:33 PM

## 2017-07-06 ENCOUNTER — Telehealth: Payer: Self-pay | Admitting: Internal Medicine

## 2017-07-06 NOTE — Telephone Encounter (Signed)
Copied from CRM [id number]. Topic: Quick Communication - Rx Refill/Question >> Jul 06, 2017 11:20 AM Eston Mouldavis, Mariusz Jubb B wrote: Medication: levothyroxine (SYNTHROID, LEVOTHROID) 100 MCG tablet  Has the patient contacted their pharmacy? {no not filled here before   (Agent: If no, request that the patient contact the pharmacy for the refill.)  Preferred Pharmacy (with phone number or street name):[facility] [id number] [place] - [address] [phone] (Phone) [phone] (Fax)      Agent: Please be advised that RX refills may take up to 3 business days. We ask that you follow-up with your pharmacy.

## 2017-07-06 NOTE — Telephone Encounter (Signed)
Left Vm re: refill request; filled 06/19/17.

## 2017-07-24 ENCOUNTER — Encounter: Payer: Self-pay | Admitting: Internal Medicine

## 2017-07-24 ENCOUNTER — Ambulatory Visit (INDEPENDENT_AMBULATORY_CARE_PROVIDER_SITE_OTHER): Payer: Medicare Other | Admitting: Internal Medicine

## 2017-07-24 VITALS — BP 130/82 | HR 68 | Temp 98.2°F | Ht 62.0 in | Wt 112.0 lb

## 2017-07-24 DIAGNOSIS — S161XXA Strain of muscle, fascia and tendon at neck level, initial encounter: Secondary | ICD-10-CM | POA: Diagnosis not present

## 2017-07-24 MED ORDER — TIZANIDINE HCL 2 MG PO TABS: 2.0000 mg | ORAL_TABLET | Freq: Every evening | ORAL | 0 refills | Status: DC | PRN

## 2017-07-24 NOTE — Progress Notes (Signed)
Subjective:    Patient ID: Diana Owen, female    DOB: [birth year], 83 y.o.   MRN: [id number]  HPI Here due to muscle spasms Did a lot of work in the garden for 2 days Lots of lifting Started after that Low back pain and radiated up both sides of spine to head Had to hold head to be able to even get out of bed Couldn't even drive husband to the [employer] it easy for a few days Now improving Tried tylenol--not helpful Ibuprofen 200-400mg  did help (at most 5 days this week) Tried heat--some help Also tried lidocaine rub on  Did have another episode of SBO Did resolve without surgery  She has put herself on more liquids and a soft diet No problems since going home  Current Outpatient Medications on File Prior to Visit  Medication Sig Dispense Refill  . cholecalciferol (VITAMIN D) 1000 units tablet Take 1,000 Units by mouth daily.    . Cyanocobalamin (VITAMIN B-12) 6000 MCG SUBL Place 1 tablet under the tongue daily.    Marland Kitchen levothyroxine (SYNTHROID, LEVOTHROID) 100 MCG tablet Take 1 tablet (100 mcg total) by mouth daily. 90 tablet 3  . Multiple Vitamins-Minerals (MULTIVITAMIN WITH MINERALS) tablet Take 1 tablet by mouth daily.    . polyethylene glycol (MIRALAX / GLYCOLAX) packet Take 17 g by mouth daily.    . SF 5000 PLUS 1.1 % CREA dental cream APPLY A PEA-SIZED AMOUNT TO TOOTHBRUSH IN PLACE OF NORMAL PASTE; ...  (REFER TO PRESCRIPTION NOTES).  0  . albuterol (PROVENTIL HFA;VENTOLIN HFA) 108 (90 Base) MCG/ACT inhaler Inhale 2 puffs into the lungs every 4 (four) hours as needed for wheezing or shortness of breath.      No current facility-administered medications on file prior to visit.     Allergies  Allergen Reactions  . Codeine Nausea And Vomiting  . Latex Rash    Past Medical History:  Diagnosis Date  . GERD (gastroesophageal reflux disease)   . Intractable vomiting with nausea   . Midgut volvulus   . SBO (small bowel obstruction) (HCC) 11/13/2016  . Small bowel  obstruction (HCC)   . Thyroid disease     Past Surgical History:  Procedure Laterality Date  . APPENDECTOMY    . PARTIAL COLECTOMY     large polyp  . TONSILLECTOMY    . TUBAL LIGATION      Family History  Problem Relation Age of Onset  . Leukemia Mother        CLL  . Leukemia Father   . Hyperlipidemia Brother   . Stroke Brother   . Heart disease Paternal Uncle   . Diabetes Neg Hx     Social History   Socioeconomic History  . Marital status: Married    Spouse name: Not on file  . Number of children: 3  . Years of education: Not on file  . Highest education level: Not on file  Occupational History  . Occupation: Diplomatic Services operational officer  . 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 Financial resource strain: Not on file  . Food insecurity:    Worry: Not on file    Inability: Not on file  . Transportation needs:    Medical: Not on file    Non-medical: Not on file  Tobacco Use  . Smoking status: Never Smoker  . Smokeless tobacco: Never Used  Substance and Sexual Activity  . Alcohol use: Yes    Comment: rare  wine   . Drug use: No  . Sexual activity: Yes  Lifestyle  . Physical activity:    Days per week: Not on file    Minutes per session: Not on file  . Stress: Not on file  Relationships  . Social connections:    Talks on phone: Not on file    Gets together: Not on file    Attends religious service: Not on file    Active member of club or organization: Not on file    Attends meetings of clubs or organizations: Not on file    Relationship status: Not on file  . Intimate partner violence:    Fear of current or ex partner: Not on file    Emotionally abused: Not on file    Physically abused: Not on file    Forced sexual activity: Not on file  Other Topics Concern  . Not on file  Social History Narrative   2 sons, 1 daughter--- Brett Canales in [place] in [place], Selinda Orion in [place]      Has a living will   Daughter [name], then sons, should  make health care decisions   Would accept resuscitation attempts   Not sure about tube feeds   Review of Systems No sleep problems No arm weakness    Objective:   Physical Exam  Constitutional: She appears well-developed. No distress.  Neck:  Marked spasm in right trapezius muscle Limited extension, tilt and rotation Mild decrease in flexion No spine tenderness  Neurological:  No arm weakness          Assessment & Plan:

## 2017-07-24 NOTE — Assessment & Plan Note (Signed)
Better but still some spasm --especially in right trap Discussed heat Occasional ibuprofen Bedtime tizanidine prn

## 2017-08-12 DIAGNOSIS — H02889 Meibomian gland dysfunction of unspecified eye, unspecified eyelid: Secondary | ICD-10-CM | POA: Diagnosis not present

## 2017-08-25 ENCOUNTER — Emergency Department: Payer: Medicare Other

## 2017-08-25 ENCOUNTER — Other Ambulatory Visit: Payer: Self-pay

## 2017-08-25 ENCOUNTER — Inpatient Hospital Stay
Admission: EM | Admit: 2017-08-25 | Discharge: 2017-08-30 | DRG: 390 | Disposition: A | Discharge status: Home / Self Care | Payer: Medicare Other | Attending: Surgery | Admitting: Surgery

## 2017-08-25 DIAGNOSIS — K56609 Unspecified intestinal obstruction, unspecified as to partial versus complete obstruction: Secondary | ICD-10-CM | POA: Diagnosis not present

## 2017-08-25 DIAGNOSIS — Z4659 Encounter for fitting and adjustment of other gastrointestinal appliance and device: Secondary | ICD-10-CM

## 2017-08-25 DIAGNOSIS — R109 Unspecified abdominal pain: Secondary | ICD-10-CM | POA: Diagnosis not present

## 2017-08-25 DIAGNOSIS — Z79890 Hormone replacement therapy (postmenopausal): Secondary | ICD-10-CM

## 2017-08-25 DIAGNOSIS — R040 Epistaxis: Secondary | ICD-10-CM | POA: Diagnosis not present

## 2017-08-25 DIAGNOSIS — E039 Hypothyroidism, unspecified: Secondary | ICD-10-CM | POA: Diagnosis not present

## 2017-08-25 DIAGNOSIS — R1084 Generalized abdominal pain: Secondary | ICD-10-CM | POA: Diagnosis not present

## 2017-08-25 DIAGNOSIS — E785 Hyperlipidemia, unspecified: Secondary | ICD-10-CM | POA: Diagnosis not present

## 2017-08-25 DIAGNOSIS — Z885 Allergy status to narcotic agent status: Secondary | ICD-10-CM

## 2017-08-25 DIAGNOSIS — K9132 Postprocedural complete intestinal obstruction: Principal | ICD-10-CM | POA: Diagnosis present

## 2017-08-25 DIAGNOSIS — Z8249 Family history of ischemic heart disease and other diseases of the circulatory system: Secondary | ICD-10-CM

## 2017-08-25 DIAGNOSIS — K219 Gastro-esophageal reflux disease without esophagitis: Secondary | ICD-10-CM | POA: Diagnosis not present

## 2017-08-25 DIAGNOSIS — Z8349 Family history of other endocrine, nutritional and metabolic diseases: Secondary | ICD-10-CM

## 2017-08-25 DIAGNOSIS — Z806 Family history of leukemia: Secondary | ICD-10-CM

## 2017-08-25 DIAGNOSIS — K5652 Intestinal adhesions [bands] with complete obstruction: Secondary | ICD-10-CM

## 2017-08-25 DIAGNOSIS — K56699 Other intestinal obstruction unspecified as to partial versus complete obstruction: Secondary | ICD-10-CM | POA: Diagnosis not present

## 2017-08-25 DIAGNOSIS — Z823 Family history of stroke: Secondary | ICD-10-CM

## 2017-08-25 DIAGNOSIS — Z91040 Latex allergy status: Secondary | ICD-10-CM

## 2017-08-25 DIAGNOSIS — Z9049 Acquired absence of other specified parts of digestive tract: Secondary | ICD-10-CM

## 2017-08-25 LAB — CBC WITH DIFFERENTIAL/PLATELET
BASOS ABS: 0 10*3/uL (ref 0–0.1)
BASOS PCT: 0 %
EOS PCT: 1 %
Eosinophils Absolute: 0.1 10*3/uL (ref 0–0.7)
HCT: 39.9 % (ref 35.0–47.0)
Hemoglobin: 14 g/dL (ref 12.0–16.0)
Lymphocytes Relative: 10 %
Lymphs Abs: 1 10*3/uL (ref 1.0–3.6)
MCH: 30.8 pg (ref 26.0–34.0)
MCHC: 35 g/dL (ref 32.0–36.0)
MCV: 88.1 fL (ref 80.0–100.0)
Monocytes Absolute: 0.7 10*3/uL (ref 0.2–0.9)
Monocytes Relative: 7 %
NEUTROS ABS: 8.3 10*3/uL — AB (ref 1.4–6.5)
Neutrophils Relative %: 82 %
PLATELETS: 190 10*3/uL (ref 150–440)
RBC: 4.54 MIL/uL (ref 3.80–5.20)
RDW: 13.4 % (ref 11.5–14.5)
WBC: 10.1 10*3/uL (ref 3.6–11.0)

## 2017-08-25 MED ORDER — ONDANSETRON HCL 4 MG/2ML IJ SOLN
4.0000 mg | Freq: Once | INTRAMUSCULAR | Status: AC
  Administered 2017-08-25: 4 mg via INTRAVENOUS

## 2017-08-25 MED ORDER — ONDANSETRON HCL 4 MG/2ML IJ SOLN
INTRAMUSCULAR | Status: AC
  Filled 2017-08-25: qty 2

## 2017-08-25 NOTE — ED Triage Notes (Signed)
Pt complains of generalized abd pain. Pt with distension noted. Pt states nausea, no vomiting.

## 2017-08-25 NOTE — ED Provider Notes (Signed)
[facility] Emergency Department Provider Note   First MD Initiated Contact with Patient 08/25/17 2254     (approximate)  I have reviewed the triage vital signs and the nursing notes.   HISTORY  Chief Complaint Abdominal Pain   HPI Diana Owen is a 83 y.o. female with below list of chronic medical conditions including multiple episodes of bowel obstructions presents to the emergency department with acute onset of generalized abdominal discomfort and nausea tonight.  Patient describes current pain as moderate.  Patient denies any vomiting.  Patient denies any diarrhea.  Patient denies any fever   Past Medical History:  Diagnosis Date  . GERD (gastroesophageal reflux disease)   . Intractable vomiting with nausea   . Midgut volvulus   . SBO (small bowel obstruction) (HCC) 11/13/2016  . Small bowel obstruction (HCC)   . Thyroid disease     Patient Active Problem List   Diagnosis Date Noted  . Cervical strain 07/24/2017  . SBO (small bowel obstruction) (HCC) 06/30/2017  . Small bowel obstruction due to postoperative adhesions 06/29/2017  . Malnutrition of mild degree (HCC) 11/21/2016  . Midgut volvulus   . Other idiopathic scoliosis, thoracolumbar region 07/16/2016  . Oral phase dysphagia 09/26/2015  . Acid reflux 09/04/2015  . Chronic abdominal pain 09/04/2015  . Hyperlipidemia 09/04/2015  . Hypothyroidism 09/04/2015    Past Surgical History:  Procedure Laterality Date  . APPENDECTOMY    . PARTIAL COLECTOMY     large polyp  . TONSILLECTOMY    . TUBAL LIGATION      Prior to Admission medications   Medication Sig Start Date End Date Taking? Authorizing Provider  albuterol (PROVENTIL HFA;VENTOLIN HFA) 108 (90 Base) MCG/ACT inhaler Inhale 2 puffs into the lungs every 4 (four) hours as needed for wheezing or shortness of breath.  07/03/16 07/03/17  [provider]  cholecalciferol (VITAMIN D) 1000 units tablet Take 1,000 Units by mouth  daily.    [provider]  Cyanocobalamin (VITAMIN B-12) 6000 MCG SUBL Place 1 tablet under the tongue daily.    [provider]  levothyroxine (SYNTHROID, LEVOTHROID) 100 MCG tablet Take 1 tablet (100 mcg total) by mouth daily. 06/19/17   Karie Schwalbe, MD  Multiple Vitamins-Minerals (MULTIVITAMIN WITH MINERALS) tablet Take 1 tablet by mouth daily.    [provider]  polyethylene glycol (MIRALAX / GLYCOLAX) packet Take 17 g by mouth daily.    [provider]  SF 5000 PLUS 1.1 % CREA dental cream APPLY A PEA-SIZED AMOUNT TO TOOTHBRUSH IN PLACE OF NORMAL PASTE; ...  (REFER TO PRESCRIPTION NOTES). 02/26/17   [provider]  tiZANidine (ZANAFLEX) 2 MG tablet Take 1 tablet (2 mg total) by mouth at bedtime as needed for muscle spasms. 07/24/17   Karie Schwalbe, MD    Allergies Codeine and Latex  Family History  Problem Relation Age of Onset  . Leukemia Mother        CLL  . Leukemia Father   . Hyperlipidemia Brother   . Stroke Brother   . Heart disease Paternal Uncle   . Diabetes Neg Hx     Social History Social History   Tobacco Use  . Smoking status: Never Smoker  . Smokeless tobacco: Never Used  Substance Use Topics  . Alcohol use: Yes    Comment: rare wine   . Drug use: No    Review of Systems Constitutional: No fever/chills Eyes: No visual changes. ENT: No sore throat. Cardiovascular:  Denies chest pain. Respiratory: Denies shortness of breath. Gastrointestinal: Positive for abdominal pain nausea Genitourinary: Negative for dysuria. Musculoskeletal: Negative for neck pain.  Negative for back pain. Integumentary: Negative for rash. Neurological: Negative for headaches, focal weakness or numbness.   ____________________________________________   PHYSICAL EXAM:  VITAL SIGNS: ED Triage Vitals [08/25/17 2255]  Enc Vitals Group     BP      Pulse Rate 74     Resp 18     Temp      Temp Source Oral     SpO2 100 %      Weight      Height      Head Circumference      Peak Flow      Pain Score 1     Pain Loc      Pain Edu?      Excl. in GC?     Constitutional: Alert and oriented. Well appearing and in no acute distress. Eyes: Conjunctivae are normal.  Head: Atraumatic. Mouth/Throat: Mucous membranes are moist.  Oropharynx non-erythematous. Neck: No stridor.  Cardiovascular: Normal rate, regular rhythm. Good peripheral circulation. Grossly normal heart sounds. Respiratory: Normal respiratory effort.  No retractions. Lungs CTAB. Gastrointestinal: Generalized tenderness to palpation.  Distended tympanic on percussion.. No distention.  Musculoskeletal: No lower extremity tenderness nor edema. No gross deformities of extremities. Neurologic:  Normal speech and language. No gross focal neurologic deficits are appreciated.  Skin:  Skin is warm, dry and intact. No rash noted. Psychiatric: Mood and affect are normal. Speech and behavior are normal.  ____________________________________________   LABS (all labs ordered are listed, but only abnormal results are displayed)  Labs Reviewed  CBC WITH DIFFERENTIAL/PLATELET - Abnormal; Notable for the following components:      Result Value   Neutro Abs 8.3 (*)    All other components within normal limits  COMPREHENSIVE METABOLIC PANEL - Abnormal; Notable for the following components:   Potassium 3.2 (*)    Glucose, Bld 124 (*)    Total Bilirubin 1.3 (*)    All other components within normal limits  LIPASE, BLOOD   ____________________________________________  EKG  ED ECG REPORT I, [place] Quetzali Heinle, the attending physician, personally viewed and interpreted this ECG.   Date: 08/26/2017  EKG Time: 10:59 PM  Rate: 68  Rhythm: Normal sinus rhythm  Axis: Normal  Intervals: Normal  ST&T Change: None  ____________________________________________  RADIOLOGY I, [place] Jahmiya Guidotti, personally viewed and evaluated these images (plain radiographs) as  part of my medical decision making, as well as reviewing the written report by the radiologist.  ED MD interpretation: None  Official radiology report(s): Dg Abdomen 1 View  Result Date: 08/25/2017 CLINICAL DATA:  Generalized abdominal pain. EXAM: ABDOMEN - 1 VIEW COMPARISON:  July 02, 2017 FINDINGS: Fecal loading in the colon. A mildly dilated loop of bowel in the lower right central abdomen soft represent mild dilatation of small bowel loop measuring up to 4.1 cm. No other acute abnormalities. Scoliotic curvature lumbar spine. IMPRESSION: 1. There is a mildly dilated loop of small bowel in the right pelvis. This could represent ileus versus developing small bowel obstruction. Recommend follow-up KUB versus CT imaging. 2. Fecal loading in the colon. Electronically Signed   By: Gerome Samavid  Williams III M.D   On: 08/25/2017 23:25     Procedures   ____________________________________________   INITIAL IMPRESSION / ASSESSMENT AND PLAN / ED COURSE  As part of my medical decision making, I reviewed the following  data within the electronic MEDICAL RECORD NUMBER  83 year old female presenting with history and physical exam consistent with possible small bowel obstruction.  Patient with continued nausea vomiting despite antiemetics and as such NG tube was inserted.  Patient discussed with Dr. Michela Pitcher for hospital admission for further evaluation and management of the small bowel obstruction. ____________________________________________  FINAL CLINICAL IMPRESSION(S) / ED DIAGNOSES  Final diagnoses:  Small bowel obstruction (HCC)     MEDICATIONS GIVEN DURING THIS VISIT:  Medications  ondansetron (ZOFRAN) injection 4 mg (4 mg Intravenous Given 08/25/17 2332)     ED Discharge Orders    None       Note:  This document was prepared using Dragon voice recognition software and may include unintentional dictation errors.    Darci Current, MD 08/26/17 [phone]

## 2017-08-26 ENCOUNTER — Other Ambulatory Visit: Payer: Self-pay

## 2017-08-26 ENCOUNTER — Inpatient Hospital Stay: Payer: Medicare Other

## 2017-08-26 DIAGNOSIS — E039 Hypothyroidism, unspecified: Secondary | ICD-10-CM | POA: Diagnosis present

## 2017-08-26 DIAGNOSIS — Z9049 Acquired absence of other specified parts of digestive tract: Secondary | ICD-10-CM | POA: Diagnosis not present

## 2017-08-26 DIAGNOSIS — Z79890 Hormone replacement therapy (postmenopausal): Secondary | ICD-10-CM | POA: Diagnosis not present

## 2017-08-26 DIAGNOSIS — K56609 Unspecified intestinal obstruction, unspecified as to partial versus complete obstruction: Secondary | ICD-10-CM | POA: Diagnosis not present

## 2017-08-26 DIAGNOSIS — R040 Epistaxis: Secondary | ICD-10-CM | POA: Diagnosis not present

## 2017-08-26 DIAGNOSIS — Z823 Family history of stroke: Secondary | ICD-10-CM | POA: Diagnosis not present

## 2017-08-26 DIAGNOSIS — E785 Hyperlipidemia, unspecified: Secondary | ICD-10-CM | POA: Diagnosis present

## 2017-08-26 DIAGNOSIS — K219 Gastro-esophageal reflux disease without esophagitis: Secondary | ICD-10-CM | POA: Diagnosis present

## 2017-08-26 DIAGNOSIS — Z91040 Latex allergy status: Secondary | ICD-10-CM | POA: Diagnosis not present

## 2017-08-26 DIAGNOSIS — Z8249 Family history of ischemic heart disease and other diseases of the circulatory system: Secondary | ICD-10-CM | POA: Diagnosis not present

## 2017-08-26 DIAGNOSIS — K5652 Intestinal adhesions [bands] with complete obstruction: Secondary | ICD-10-CM | POA: Diagnosis not present

## 2017-08-26 DIAGNOSIS — Z8349 Family history of other endocrine, nutritional and metabolic diseases: Secondary | ICD-10-CM | POA: Diagnosis not present

## 2017-08-26 DIAGNOSIS — K9132 Postprocedural complete intestinal obstruction: Secondary | ICD-10-CM | POA: Diagnosis present

## 2017-08-26 DIAGNOSIS — Z885 Allergy status to narcotic agent status: Secondary | ICD-10-CM | POA: Diagnosis not present

## 2017-08-26 DIAGNOSIS — Z4682 Encounter for fitting and adjustment of non-vascular catheter: Secondary | ICD-10-CM | POA: Diagnosis not present

## 2017-08-26 DIAGNOSIS — Z806 Family history of leukemia: Secondary | ICD-10-CM | POA: Diagnosis not present

## 2017-08-26 LAB — BASIC METABOLIC PANEL
Anion gap: 9 (ref 5–15)
BUN: 20 mg/dL (ref 6–20)
CALCIUM: 9.5 mg/dL (ref 8.9–10.3)
CHLORIDE: 105 mmol/L (ref 101–111)
CO2: 30 mmol/L (ref 22–32)
CREATININE: 0.74 mg/dL (ref 0.44–1.00)
GFR calc Af Amer: >60 mL/min (ref >60)
GFR calc non Af Amer: >60 mL/min (ref >60)
Glucose, Bld: 193 mg/dL — ABNORMAL HIGH (ref 65–99)
Potassium: 3.7 mmol/L (ref 3.5–5.1)
SODIUM: 144 mmol/L (ref 135–145)

## 2017-08-26 LAB — COMPREHENSIVE METABOLIC PANEL
ALBUMIN: 4.3 g/dL (ref 3.5–5.0)
ALT: 21 U/L (ref 14–54)
AST: 29 U/L (ref 15–41)
Alkaline Phosphatase: 55 U/L (ref 38–126)
Anion gap: 14 (ref 5–15)
BUN: 18 mg/dL (ref 6–20)
CHLORIDE: 102 mmol/L (ref 101–111)
CO2: 23 mmol/L (ref 22–32)
CREATININE: 0.61 mg/dL (ref 0.44–1.00)
Calcium: 9.7 mg/dL (ref 8.9–10.3)
GFR calc Af Amer: >60 mL/min (ref >60)
GLUCOSE: 124 mg/dL — AB (ref 65–99)
POTASSIUM: 3.2 mmol/L — AB (ref 3.5–5.1)
Sodium: 139 mmol/L (ref 135–145)
Total Bilirubin: 1.3 mg/dL — ABNORMAL HIGH (ref 0.3–1.2)
Total Protein: 7.2 g/dL (ref 6.5–8.1)

## 2017-08-26 LAB — LIPASE, BLOOD: LIPASE: 34 U/L (ref 11–51)

## 2017-08-26 LAB — CBC
HCT: 41 % (ref 35.0–47.0)
Hemoglobin: 14.1 g/dL (ref 12.0–16.0)
MCH: 30.8 pg (ref 26.0–34.0)
MCHC: 34.5 g/dL (ref 32.0–36.0)
MCV: 89.5 fL (ref 80.0–100.0)
PLATELETS: 190 10*3/uL (ref 150–440)
RBC: 4.59 MIL/uL (ref 3.80–5.20)
RDW: 13.6 % (ref 11.5–14.5)
WBC: 14.5 10*3/uL — AB (ref 3.6–11.0)

## 2017-08-26 LAB — MRSA PCR SCREENING: MRSA BY PCR: NEGATIVE

## 2017-08-26 MED ORDER — KCL IN DEXTROSE-NACL 20-5-0.45 MEQ/L-%-% IV SOLN
INTRAVENOUS | Status: DC
  Administered 2017-08-26 – 2017-08-30 (×5): via INTRAVENOUS
  Filled 2017-08-26 (×11): qty 1000

## 2017-08-26 MED ORDER — MORPHINE SULFATE (PF) 4 MG/ML IV SOLN
4.0000 mg | INTRAVENOUS | Status: DC | PRN
Start: 2017-08-26 — End: 2017-08-29
  Administered 2017-08-29: 4 mg via INTRAVENOUS
  Filled 2017-08-26: qty 1

## 2017-08-26 MED ORDER — MENTHOL 3 MG MT LOZG
1.0000 | LOZENGE | OROMUCOSAL | Status: DC | PRN
  Filled 2017-08-26 (×2): qty 9

## 2017-08-26 MED ORDER — FAMOTIDINE IN NACL 20-0.9 MG/50ML-% IV SOLN
20.0000 mg | INTRAVENOUS | Status: DC
  Administered 2017-08-26 – 2017-08-30 (×5): 20 mg via INTRAVENOUS
  Filled 2017-08-26 (×5): qty 50

## 2017-08-26 MED ORDER — ENOXAPARIN SODIUM 40 MG/0.4ML ~~LOC~~ SOLN
40.0000 mg | SUBCUTANEOUS | Status: DC
  Administered 2017-08-26 – 2017-08-30 (×5): 40 mg via SUBCUTANEOUS
  Filled 2017-08-26 (×4): qty 0.4

## 2017-08-26 MED ORDER — ONDANSETRON 4 MG PO TBDP: 4.0000 mg | ORAL_TABLET | Freq: Four times a day (QID) | ORAL | Status: DC | PRN

## 2017-08-26 MED ORDER — ONDANSETRON HCL 4 MG/2ML IJ SOLN
4.0000 mg | Freq: Four times a day (QID) | INTRAMUSCULAR | Status: DC | PRN
  Administered 2017-08-26 – 2017-08-27 (×2): 4 mg via INTRAVENOUS
  Filled 2017-08-26 (×3): qty 2

## 2017-08-26 NOTE — Progress Notes (Signed)
SURGICAL PROGRESS NOTE    Diana Owen  ZOX:[id number] DOB: 05/17/1934 DOA: 08/25/2017  History: The patient is an 83 year-old female who is admitted with recurrent small bowel obstruction, likely secondary to postsurgical adhesions.  Subjective: The patient states that she feels better than yesterday with less nausea and decreased abdominal distension and pain. She denies vomiting since NGT was placed. She denies bowel movement or flatus. She denies fever and chills. She denies chest pain and shortness of breath. The patient experienced one episode of epistaxis this morning which resolved spontaneously.  Objective: Vitals:   08/26/17 0100 08/26/17 0130 08/26/17 0205 08/26/17 1257  BP: (!) 150/78 134/82 (!) 142/75 106/67  Pulse: 62 (!) 57 (!) 58 70  Resp: 20 14 18 15   Temp:   97.8 F (36.6 C) 98.8 F (37.1 C)  TempSrc:   Oral Oral  SpO2: 96% 98% 100% 98%  Weight:      Height:        Examination:  General exam: Appears calm and comfortable  Respiratory system: Respiratory effort normal. Cardiovascular system: RRR. No pedal edema. Gastrointestinal system: Abdomen is mildly distended, soft and nontender.  Decreased bowel sounds. No guarding, rebound tenderness, or rigidity. Central nervous system: Alert and oriented. No focal neurological deficits. Extremities: No calf tenderness or swelling Skin: No rashes, lesions or ulcers Psychiatry: Judgement and insight appear normal. Mood & affect appropriate.    Data Reviewed: I have personally reviewed following labs and imaging studies  CBC: Recent Labs  Lab 08/25/17 2304 08/26/17 0418  WBC 10.1 14.5*  NEUTROABS 8.3*  --   HGB 14.0 14.1  HCT 39.9 41.0  MCV 88.1 89.5  PLT 190 190   Basic Metabolic Panel: Recent Labs  Lab 08/25/17 2304 08/26/17 0418  NA 139 144  K 3.2* 3.7  CL 102 105  CO2 23 30  GLUCOSE 124* 193*  BUN 18 20  CREATININE 0.61 0.74  CALCIUM 9.7 9.5   GFR: Estimated Creatinine Clearance: 41.6 mL/min  (by C-G formula based on SCr of 0.74 mg/dL). Liver Function Tests: Recent Labs  Lab 08/25/17 2304  AST 29  ALT 21  ALKPHOS 55  BILITOT 1.3*  PROT 7.2  ALBUMIN 4.3   Recent Labs  Lab 08/25/17 2304  LIPASE 34     Radiology Studies: Dg Abd 1 View  Result Date: 08/26/2017 CLINICAL DATA:  Encounter for NG tube placement. EXAM: ABDOMEN - 1 VIEW COMPARISON:  Most recent radiograph yesterday. Most recent CT 06/29/2017. FINDINGS: Tip and side port of the enteric tube below the diaphragm in the stomach. Air-filled bowel in the central abdomen likely dilated small bowel. No evidence of free air. IMPRESSION: Tip and side port of the enteric tube below the diaphragm in the stomach. Electronically Signed   By: Rubye OaksMelanie  Ehinger M.D.   On: 08/26/2017 03:04   Dg Abdomen 1 View  Result Date: 08/25/2017 CLINICAL DATA:  Generalized abdominal pain. EXAM: ABDOMEN - 1 VIEW COMPARISON:  July 02, 2017 FINDINGS: Fecal loading in the colon. A mildly dilated loop of bowel in the lower right central abdomen soft represent mild dilatation of small bowel loop measuring up to 4.1 cm. No other acute abnormalities. Scoliotic curvature lumbar spine. IMPRESSION: 1. There is a mildly dilated loop of small bowel in the right pelvis. This could represent ileus versus developing small bowel obstruction. Recommend follow-up KUB versus CT imaging. 2. Fecal loading in the colon. Electronically Signed   By: Gerome Samavid  Williams III M.D   On:  08/25/2017 23:25    Scheduled Meds: . enoxaparin (LOVENOX) injection  40 mg Subcutaneous Q24H   Continuous Infusions: . dextrose 5 % and 0.45 % NaCl with KCl 20 mEq/L 75 mL/hr at 08/26/17 0347  . famotidine (PEPCID) IV Stopped (08/26/17 0308)    Assessment/Plan: The patient is an 83 year-old female with a history of GERD, thyroid disease, and small bowel obstruction who is admitted with recurrent small bowel obstruction likely secondary to postsurgical adhesions. Overall she appears to be  improving with NGT decompression. She does have a slight leukocytosis today which we will continue to follow.  - Continue NGT decompression - Continue IVF hydration - Continue pepcid IV - Encourage ambulation - DVT prophylaxis with lovenox - No plans for surgical intervention at this time - Surgical intervention was discussed in the case of failed non-operative management or electively once the obstruction resolves   Reyah Streeter W  Azarie Coriz, DO

## 2017-08-26 NOTE — H&P (Signed)
Diana Owen is a 83 y.o. female with abdominal pain for approximately 4 hours.  HPI: She is a pleasant 83 year old woman with multiple episodes of small bowel obstruction over the last several months.  This is the fourth episode in the past 10 months.  She has had 3 in the last 5 months.  She began to develop some pain after dinner this evening with some mild abdominal distention.  She does relate that she had a normal bowel movements afternoon.  However, she has not been passing gas over the course of the day.  The pain increased and she presented to the emergency room for further evaluation.  Plain films demonstrated what appeared to be an ileus versus early small bowel obstruction.  She does not have any significant elevation white blood cell count.  Her potassium is low at 3.2.  She was admitted in April 2019 with a similar presentation and CT scan at that time demonstrated a small bowel obstruction with a transition zone.  She responded to decompressive therapy.  She has been seen by the gastroenterology service and no endoscopy has been performed today.  She has history of a midgut volvulus which resolved spontaneously.  I do not have any evidence on CT scan of that incident.  She denies any history of hepatitis, yellow jaundice, pancreatitis, peptic ulcer disease, gallbladder disease, or diverticulitis.  Her surgical history is significant for an appendectomy had a colon resection for a large polyp.  She does have significant history of gastroesophageal reflux disease, and hypothyroidism.  She denies any cardiac disease diabetes or hypertension.  Current symptoms and clinical presentation surgical service was consulted.  Past Medical History:  Diagnosis Date  . GERD (gastroesophageal reflux disease)   . Intractable vomiting with nausea   . Midgut volvulus   . SBO (small bowel obstruction) (HCC) 11/13/2016  . Small bowel obstruction (HCC)   . Thyroid disease    Past Surgical History:   Procedure Laterality Date  . APPENDECTOMY    . PARTIAL COLECTOMY     large polyp  . TONSILLECTOMY    . TUBAL LIGATION     Social History   Socioeconomic History  . Marital status: Married    Spouse name: Not on file  . Number of children: 3  . Years of education: Not on file  . Highest education level: Not on file  Occupational History  . Occupation: Diplomatic Services operational officer  . 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 Financial resource strain: Not on file  . Food insecurity:    Worry: Not on file    Inability: Not on file  . Transportation needs:    Medical: Not on file    Non-medical: Not on file  Tobacco Use  . Smoking status: Never Smoker  . Smokeless tobacco: Never Used  Substance and Sexual Activity  . Alcohol use: Yes    Comment: rare wine   . Drug use: No  . Sexual activity: Yes  Lifestyle  . Physical activity:    Days per week: Not on file    Minutes per session: Not on file  . Stress: Not on file  Relationships  . Social connections:    Talks on phone: Not on file    Gets together: Not on file    Attends religious service: Not on file    Active member of club or organization: Not on file    Attends meetings of clubs or organizations: Not on file  Relationship status: Not on file  Other Topics Concern  . Not on file  Social History Narrative   2 sons, 1 daughter--- [name] in [place]   [name] in [place], [name] in [place]      Has a living will   Daughter Darel HongJudy, then sons, should make health care decisions   Would accept resuscitation attempts   Not sure about tube feeds     ROS review of systems is performed no other significant abnormalities were identified.  Specifically she denies any cardiac, pulmonary, genitourinary, or neurologic problems.   PHYSICAL EXAM: BP 137/84   Pulse (!) 53   Temp 98.2 F (36.8 C) (Oral)   Resp 15   Ht 5\' 1"  (1.549 m)   Wt 50.3 kg (111 lb)   SpO2 94%   BMI 20.97 kg/m   Physical Exam General:  She appears comfortable with nasogastric tube and denies any pain at the present time.  HEENT: She has normal sclerae with normal pupils and no cranial deformities.  Neck: Neck is supple with no adenopathy and she has midline trachea.  Pulmonary: Her lungs are clear bilaterally with no adventitious sounds she has normal pulmonary excursion.  Cardiac: I cannot detect any murmurs gallops to my examination she appears to be in normal sinus rhythm to my ear.  : GI: She has moderate abdominal distention with some tympany.  She has minimal guarding and no rebound.  She has hypoactive but present bowel sounds.  Extremity: She has good distal pulses with no deformities full range of motion.  Neuro: She is normal sensory and motor findings.  Psych: Normal orientation normal affect  :  Impression/Plan: Independently reviewed her plain films.  With her clinical presentation and past history I suspect she has another small bowel obstruction.  She has responded to nonoperative decompression and we will institute that therapy once again.  If she continues to have symptoms without evidence of improvement I would recommend a repeat CT scan.  I did discuss holding the CT scan with emergency room physician and we both agreed to wait at the present time.  My big concern is that she has had multiple episodes last several months has history of midgut volvulus.  Her health is otherwise reasonably good.  I think she may need to consider surgical intervention at some point elective basis to allow this problem to resolve and avoid these multiple admissions over a short period of time.  She is in agreement with the current plan and will consider the surgical options.   Tiney Rougealph Ely III, MD  08/26/2017, 1:14 AM

## 2017-08-27 ENCOUNTER — Inpatient Hospital Stay: Payer: Medicare Other

## 2017-08-27 LAB — BASIC METABOLIC PANEL
Anion gap: 4 — ABNORMAL LOW (ref 5–15)
BUN: 11 mg/dL (ref 6–20)
CO2: 28 mmol/L (ref 22–32)
CREATININE: 0.55 mg/dL (ref 0.44–1.00)
Calcium: 8.4 mg/dL — ABNORMAL LOW (ref 8.9–10.3)
Chloride: 105 mmol/L (ref 101–111)
Glucose, Bld: 144 mg/dL — ABNORMAL HIGH (ref 65–99)
Potassium: 3.5 mmol/L (ref 3.5–5.1)
SODIUM: 137 mmol/L (ref 135–145)

## 2017-08-27 LAB — CBC WITH DIFFERENTIAL/PLATELET
BASOS PCT: 0 %
Basophils Absolute: 0 10*3/uL (ref 0–0.1)
EOS ABS: 0 10*3/uL (ref 0–0.7)
Eosinophils Relative: 0 %
HCT: 38.8 % (ref 35.0–47.0)
Hemoglobin: 13.3 g/dL (ref 12.0–16.0)
LYMPHS ABS: 0.7 10*3/uL — AB (ref 1.0–3.6)
Lymphocytes Relative: 7 %
MCH: 30.6 pg (ref 26.0–34.0)
MCHC: 34.2 g/dL (ref 32.0–36.0)
MCV: 89.5 fL (ref 80.0–100.0)
MONOS PCT: 8 %
Monocytes Absolute: 0.9 10*3/uL (ref 0.2–0.9)
NEUTROS PCT: 85 %
Neutro Abs: 9 10*3/uL — ABNORMAL HIGH (ref 1.4–6.5)
Platelets: 187 10*3/uL (ref 150–440)
RBC: 4.34 MIL/uL (ref 3.80–5.20)
RDW: 13.5 % (ref 11.5–14.5)
WBC: 10.7 10*3/uL (ref 3.6–11.0)

## 2017-08-27 MED ORDER — FLUTICASONE PROPIONATE 50 MCG/ACT NA SUSP
2.0000 | Freq: Every day | NASAL | Status: DC
  Administered 2017-08-27 – 2017-08-30 (×5): 2 via NASAL
  Filled 2017-08-27: qty 16

## 2017-08-27 NOTE — Progress Notes (Signed)
SATURATION QUALIFICATIONS: (This note is used to comply with regulatory documentation for home oxygen)  Patient Saturations on Room Air at Rest = 98%  Patient Saturations on Room Air while Ambulating = 95%  Patient Saturations on 1 Liters of oxygen while Ambulating = 99% Pt does not need oxygen anymore due to her oxygen saturations while walking did not drop below 90%. She will not need oxygen anymore this admission.

## 2017-08-27 NOTE — Progress Notes (Signed)
CC: Small bowel obstruction Subjective: This patient with a small bowel obstruction.  She has had 4 in the last year 3 since January.  All requiring hospitalization.  Today she feels better she is passing gas has not had a bowel movement has no nausea or vomiting.  No fevers or chills.  Objective: Vital signs in last 24 hours: Temp:  [98.4 F (36.9 C)-99.1 F (37.3 C)] 98.4 F (36.9 C) (06/13 0430) Pulse Rate:  [55-70] 55 (06/13 0430) Resp:  [15-18] 17 (06/13 0430) BP: (106-125)/(67-70) 125/70 (06/13 0430) SpO2:  [95 %-98 %] 95 % (06/13 0430) Last BM Date: 08/26/17  Intake/Output from previous day: 06/12 0701 - 06/13 0700 In: 1712 [I.V.:1612; IV Piggyback:100] Out: 1200 [Urine:700; Emesis/NG output:500] Intake/Output this shift: No intake/output data recorded.  Physical exam:  Abdomen is soft but distended and tympanitic nontender scars are noted calves are nontender no icterus no jaundice vital signs reviewed  Lab Results: CBC  Recent Labs    08/26/17 0418 08/27/17 0455  WBC 14.5* 10.7  HGB 14.1 13.3  HCT 41.0 38.8  PLT 190 187   BMET Recent Labs    08/26/17 0418 08/27/17 0455  NA 144 137  K 3.7 3.5  CL 105 105  CO2 30 28  GLUCOSE 193* 144*  BUN 20 11  CREATININE 0.74 0.55  CALCIUM 9.5 8.4*   PT/INR No results for input(s): LABPROT, INR in the last 72 hours. ABG No results for input(s): PHART, HCO3 in the last 72 hours.  Invalid input(s): PCO2, PO2  Studies/Results: Dg Abd 1 View  Result Date: 08/26/2017 CLINICAL DATA:  Encounter for NG tube placement. EXAM: ABDOMEN - 1 VIEW COMPARISON:  Most recent radiograph yesterday. Most recent CT 06/29/2017. FINDINGS: Tip and side port of the enteric tube below the diaphragm in the stomach. Air-filled bowel in the central abdomen likely dilated small bowel. No evidence of free air. IMPRESSION: Tip and side port of the enteric tube below the diaphragm in the stomach. Electronically Signed   By: Rubye Oaks M.D.    On: 08/26/2017 03:04   Dg Abdomen 1 View  Result Date: 08/25/2017 CLINICAL DATA:  Generalized abdominal pain. EXAM: ABDOMEN - 1 VIEW COMPARISON:  July 02, 2017 FINDINGS: Fecal loading in the colon. A mildly dilated loop of bowel in the lower right central abdomen soft represent mild dilatation of small bowel loop measuring up to 4.1 cm. No other acute abnormalities. Scoliotic curvature lumbar spine. IMPRESSION: 1. There is a mildly dilated loop of small bowel in the right pelvis. This could represent ileus versus developing small bowel obstruction. Recommend follow-up KUB versus CT imaging. 2. Fecal loading in the colon. Electronically Signed   By: Gerome Sam III M.D   On: 08/25/2017 23:25    Anti-infectives: Anti-infectives (From admission, onward)   None      Assessment/Plan:  KUB pending this morning.  Patient improved with flatus and resolution of her symptoms but she remains distended and tympanitic.  Her husband and son were present.  We discussed the multiple episodes requiring hospitalization and the potential for surgical intervention to prevent this from happening again none of the members are in favor of surgical intervention unless absolutely necessary and wish to wait another day at least to see if this improves.  I spent at least 30 minutes with them spent discussing the options and rationale for offering surgery or continued conservative management and the risk of recurrence as well as the risks of surgery in detail  they wish to wait at this time and will review with KUB.  Lattie Hawichard E Khadeem Rockett, MD, FACS  08/27/2017

## 2017-08-27 NOTE — Progress Notes (Signed)
Visited patient this afternoon.  She states that she is passing minimal gas and feels better this afternoon.  Still with high NG output.  Still distended but nontender  KUB was personally reviewed.  My recommendations are that the patient likely will require surgery for exploratory laparotomy for small bowel obstruction.  She and her family are reluctant to consent to surgery in spite of her x-ray showing not much improvement.  They wish to wait till tomorrow to make a decision and will likely wait longer than that.

## 2017-08-28 ENCOUNTER — Inpatient Hospital Stay: Payer: Medicare Other

## 2017-08-28 DIAGNOSIS — K5652 Intestinal adhesions [bands] with complete obstruction: Secondary | ICD-10-CM

## 2017-08-28 MED ORDER — ACETAMINOPHEN 10 MG/ML IV SOLN
1000.0000 mg | Freq: Once | INTRAVENOUS | Status: AC
  Administered 2017-08-28: 1000 mg via INTRAVENOUS
  Filled 2017-08-28: qty 100

## 2017-08-28 NOTE — Progress Notes (Signed)
Pt complaining of headache per MD okay for RN to place one time order for IV tylenol.

## 2017-08-28 NOTE — Care Management Important Message (Signed)
Copy of signed IM left with patient in room.  

## 2017-08-28 NOTE — Progress Notes (Signed)
SURGICAL PROGRESS NOTE (cpt [phone])  Hospital Day(s): 2.   Post op day(s):  Marland Kitchen   Interval History: Patient seen and examined, no acute events or new complaints overnight. Patient reports she passed "a lot" of flatus overnight with less this morning, no BM, denies abdominal pain, distention, N/V, fever/chills, CP, or SOB. She has also been ambulating in the hallways without difficulty.  Review of Systems:  Constitutional: denies fever, chills  HEENT: denies cough or congestion  Respiratory: denies any shortness of breath  Cardiovascular: denies chest pain or palpitations  Gastrointestinal: abdominal pain, N/V, and bowel function as per interval history Genitourinary: denies burning with urination or urinary frequency Musculoskeletal: denies pain, decreased motor or sensation Integumentary: denies any other rashes or skin discolorations Neurological: denies HA or vision/hearing changes   Vital signs in last 24 hours: [min-max] current  Temp:  [98.2 F (36.8 C)-98.5 F (36.9 C)] 98.5 F (36.9 C) (06/14 0519) Pulse Rate:  [55-62] 62 (06/14 0519) Resp:  [18] 18 (06/14 0519) BP: (110-128)/(64-77) 117/64 (06/14 0519) SpO2:  [91 %-98 %] 95 % (06/14 0519)     Height: 5\' 1"  (154.9 cm) Weight: 111 lb (50.3 kg) BMI (Calculated): 20.98   Intake/Output this shift:  No intake/output data recorded.   Intake/Output last 2 shifts:  @IOLAST2SHIFTS @   Physical Exam:  Constitutional: alert, cooperative and no distress  HENT: normocephalic without obvious abnormality  Eyes: PERRL, EOM's grossly intact and symmetric  Neuro: CN II - XII grossly intact and symmetric without deficit  Respiratory: breathing non-labored at rest  Cardiovascular: regular rate and sinus rhythm  Gastrointestinal: soft, completely non-tender, and non-distended Musculoskeletal: UE and LE FROM, no edema or wounds, motor and sensation grossly intact, NT   Labs:  CBC Latest Ref Rng & Units 08/27/2017 08/26/2017 08/25/2017   WBC 3.6 - 11.0 K/uL 10.7 14.5(H) 10.1  Hemoglobin 12.0 - 16.0 g/dL 45.4 09.8 11.9  Hematocrit 35.0 - 47.0 % 38.8 41.0 39.9  Platelets 150 - 440 K/uL 187 190 190   CMP Latest Ref Rng & Units 08/27/2017 08/26/2017 08/25/2017  Glucose 65 - 99 mg/dL 147(W) 295(A) 213(Y)  BUN 6 - 20 mg/dL 11 20 18   Creatinine 0.44 - 1.00 mg/dL 8.65 7.84 6.96  Sodium 135 - 145 mmol/L 137 144 139  Potassium 3.5 - 5.1 mmol/L 3.5 3.7 3.2(L)  Chloride 101 - 111 mmol/L 105 105 102  CO2 22 - 32 mmol/L 28 30 23   Calcium 8.9 - 10.3 mg/dL 2.9(B) 9.5 9.7  Total Protein 6.5 - 8.1 g/dL - - 7.2  Total Bilirubin 0.3 - 1.2 mg/dL - - 1.3(H)  Alkaline Phos 38 - 126 U/L - - 55  AST 15 - 41 U/L - - 29  ALT 14 - 54 U/L - - 21   Imaging studies:  Abdominal X-ray (08/28/2017) - personally reviewed, compared to admission CT and prior abdominal x-rays, and discussed with radiologist, patient, and her family Though interpreted as follows, first image in series is reversed with NG tube projecting over LLQ/Left pelvis, clearly advanced too far, but unchanged x 2.5 days: 1. The tip of the nasogastric tube is in an unusual location projecting over the right side of pelvis. Although this may be within a markedly distended gastric lumen recommend injection of water-soluble contrast material to confirm intraluminal placement and repeat abdominal radiograph at time of injection. 2. Improved small bowel dilatation.  Abdominal X-ray (08/27/2017) - personally reviewed and compared to prior and subsequent radiographic studies NG tube remains in  the stomach. Gaseous distention of small  bowel loops again noted compatible with small bowel obstruction.  No change. No free air organomegaly.   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  - NPO with IV fluids - continue nasogastric decompression for now - monitor ongoing bowel function and abdominal exam  - NG tube retracted 10 - 15 cm, will recheck abdominal x-ray tomorrow morning, though may account for high NG tube output             - OR today or prior to discharge vs outpatient surgical follow-up with elective lysis of adhesions for short-interval recurrent SBO's was discussed vs acceptance of risk for short interval recurrent SBO's treated non-operatively as long as recovers with non-operative management were all discussed with patient and her family  - patient expresses preference to be discharged home if recovers with non-operative management this admission, followed by close outpatient surgical follow-up and elective lysis of adhesions prior to recurrence to reduce risk of short-interval recurrences  - if consistently passing flatus with resolved presenting abdominal pain/nausea, will plan to remove NG tube tomorrow and start clear liquids diet - surgical intervention if doesn't improve was also discussed - medical management ofcomorbidities - ambulation encouraged - DVT prophylaxis  All of the above recommendations were discussed with the patientandherfamily, and all of patient's and her family's questions were answered to their expressed satisfaction.  -- Scherrie GerlachJason E. Earlene Plateravis, MD, RPVI [place]: [facility] General Surgery - Partnering for exceptional care. Office: [phone]

## 2017-08-28 NOTE — Progress Notes (Signed)
Notified Dr. Earlene Plateravis about pt's NG place in the right side of the pelvis.

## 2017-08-29 ENCOUNTER — Inpatient Hospital Stay: Payer: Medicare Other

## 2017-08-29 NOTE — Progress Notes (Signed)
SURGICAL PROGRESS NOTE (cpt [phone])  Hospital Day(s): 3.   Post op day(s):  Marland Kitchen   Interval History: Patient seen and examined, no acute events or new complaints overnight. Patient reports ongoing passage of +flatus without any additional abdominal pain, N/V, fever/chills, CP, or SOB. She has also been ambulating in the halls around nursing station, and amount draining from NG tube has decreased and become lighter in color since NG tube retracted yesterday after x-ray demonstrated it was advanced beyond the pylorus.  Review of Systems:  Constitutional: denies fever, chills  HEENT: denies cough or congestion  Respiratory: denies any shortness of breath  Cardiovascular: denies chest pain or palpitations  Gastrointestinal: abdominal pain, N/V, and bowel function as per interval history Genitourinary: denies burning with urination or urinary frequency Musculoskeletal: denies pain, decreased motor or sensation Integumentary: denies any other rashes or skin discolorations Neurological: denies HA or vision/hearing changes   Vital signs in last 24 hours: [min-max] current  Temp:  [98.1 F (36.7 C)-98.6 F (37 C)] 98.1 F (36.7 C) (06/15 1146) Pulse Rate:  [70-79] 79 (06/15 1146) Resp:  [16] 16 (06/15 0540) BP: (103-113)/(65-83) 111/83 (06/15 1146) SpO2:  [94 %-95 %] 94 % (06/15 1146)     Height: 5\' 1"  (154.9 cm) Weight: 111 lb (50.3 kg) BMI (Calculated): 20.98   Intake/Output this shift:  Total I/O In: 618.8 [I.V.:618.8] Out: -    Intake/Output last 2 shifts:  @IOLAST2SHIFTS @   Physical Exam:  Constitutional: alert, cooperative and no distress  HENT: normocephalic without obvious abnormality  Eyes: PERRL, EOM's grossly intact and symmetric  Neuro: CN II - XII grossly intact and symmetric without deficit  Respiratory: breathing non-labored at rest  Cardiovascular: regular rate and sinus rhythm  Gastrointestinal: soft, completely non-tender, and non-distended Musculoskeletal: UE  and LE FROM, no edema or wounds, motor and sensation grossly intact, NT   Labs:  CBC Latest Ref Rng & Units 08/27/2017 08/26/2017 08/25/2017  WBC 3.6 - 11.0 K/uL 10.7 14.5(H) 10.1  Hemoglobin 12.0 - 16.0 g/dL 60.4 54.0 98.1  Hematocrit 35.0 - 47.0 % 38.8 41.0 39.9  Platelets 150 - 440 K/uL 187 190 190   CMP Latest Ref Rng & Units 08/27/2017 08/26/2017 08/25/2017  Glucose 65 - 99 mg/dL 191(Y) 782(N) 562(Z)  BUN 6 - 20 mg/dL 11 20 18   Creatinine 0.44 - 1.00 mg/dL 3.08 6.57 8.46  Sodium 135 - 145 mmol/L 137 144 139  Potassium 3.5 - 5.1 mmol/L 3.5 3.7 3.2(L)  Chloride 101 - 111 mmol/L 105 105 102  CO2 22 - 32 mmol/L 28 30 23   Calcium 8.9 - 10.3 mg/dL 9.6(E) 9.5 9.7  Total Protein 6.5 - 8.1 g/dL - - 7.2  Total Bilirubin 0.3 - 1.2 mg/dL - - 1.3(H)  Alkaline Phos 38 - 126 U/L - - 55  AST 15 - 41 U/L - - 29  ALT 14 - 54 U/L - - 21   Imaging studies:  Abdominal X-ray (08/29/2017) - personally reviewed and discussed with patient Resolved or nearly completely resolved small bowel obstruction. Gas and stool are seen in the colon. Gas is seen in nondilated small bowel. Nasogastric tube terminates in the stomach.  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  - NG tube removed, clear liquids diet ordered - monitor ongoing bowel function  and abdominal exam - OR today (or prior to discharge) vs outpatient surgical follow-up with elective lysis of adhesions for short-interval recurrent SBO's was discussed vs acceptance of risk for short-interval recurrent SBO's treated non-operatively as long as recovers with  non-operative management were all discussed with patient and her family             - patient expresses preference to be discharged home if recovers with non-operative management this admission, followed by close outpatient surgical follow-up and elective lysis of adhesions prior to recurrence to reduce risk of short-interval recurrences - surgical intervention if doesn't improve was alsodiscussed - medical management ofcomorbidities, DVT prophylaxis - ambulation encouraged  All of the above recommendations were discussed with the patientandherfamily, and all of patient's and her family's questions were answered to their expressed satisfaction.  -- Scherrie GerlachJason E. Earlene Plateravis, MD, RPVI [place]: [facility] General Surgery - Partnering for exceptional care. Office: [phone]

## 2017-08-29 NOTE — Progress Notes (Signed)
Patient resting in bed. No signs of distress. IVF infusing. NG tube secure in left nare to suction. No complaints. Continue to monitor.

## 2017-08-29 NOTE — Plan of Care (Signed)
  Problem: Education: Goal: Knowledge of General Education information will improve Outcome: Progressing   Problem: Health Behavior/Discharge Planning: Goal: Ability to manage health-related needs will improve Outcome: Progressing   Problem: Clinical Measurements: Goal: Ability to maintain clinical measurements within normal limits will improve Outcome: Progressing Goal: Will remain free from infection Outcome: Progressing Goal: Diagnostic test results will improve Outcome: Progressing Goal: Respiratory complications will improve Outcome: Progressing Goal: Cardiovascular complication will be avoided Outcome: Progressing   Problem: Activity: Goal: Risk for activity intolerance will decrease Outcome: Progressing   Problem: Nutrition: Goal: Adequate nutrition will be maintained Outcome: Progressing   Problem: Coping: Goal: Level of anxiety will decrease Outcome: Progressing   Problem: Elimination: Goal: Will not experience complications related to bowel motility Outcome: Progressing Goal: Will not experience complications related to urinary retention Outcome: Progressing   Problem: Pain Managment: Goal: General experience of comfort will improve Outcome: Progressing   Problem: Safety: Goal: Ability to remain free from injury will improve Outcome: Progressing   Problem: Skin Integrity: Goal: Risk for impaired skin integrity will decrease Outcome: Progressing   Problem: Health Behavior/Discharge Planning: Goal: Ability to manage health-related needs will improve Outcome: Progressing   Problem: Clinical Measurements: Goal: Ability to maintain clinical measurements within normal limits will improve Outcome: Progressing Goal: Will remain free from infection Outcome: Progressing Goal: Diagnostic test results will improve Outcome: Progressing Goal: Respiratory complications will improve Outcome: Progressing Goal: Cardiovascular complication will be  avoided Outcome: Progressing   Problem: Activity: Goal: Risk for activity intolerance will decrease Outcome: Progressing   Problem: Nutrition: Goal: Adequate nutrition will be maintained Outcome: Progressing   Problem: Elimination: Goal: Will not experience complications related to bowel motility Outcome: Progressing Goal: Will not experience complications related to urinary retention Outcome: Progressing   Problem: Pain Managment: Goal: General experience of comfort will improve Outcome: Progressing   Problem: Safety: Goal: Ability to remain free from injury will improve Outcome: Progressing   Problem: Skin Integrity: Goal: Risk for impaired skin integrity will decrease Outcome: Progressing

## 2017-08-29 NOTE — Progress Notes (Signed)
Patient ambulated around nurses station twice with stand by assist.

## 2017-08-30 MED ORDER — LEVOTHYROXINE SODIUM 100 MCG PO TABS
100.0000 ug | ORAL_TABLET | Freq: Every day | ORAL | Status: DC
  Administered 2017-08-30: 100 ug via ORAL
  Filled 2017-08-30: qty 1

## 2017-08-30 MED ORDER — ALBUTEROL SULFATE (2.5 MG/3ML) 0.083% IN NEBU: 3.0000 mL | INHALATION_SOLUTION | RESPIRATORY_TRACT | Status: DC | PRN

## 2017-08-30 NOTE — Progress Notes (Signed)
Ready to go home, waiting for family to arrive.

## 2017-08-30 NOTE — Progress Notes (Signed)
SURGICAL PROGRESS NOTE (cpt [phone])  Hospital Day(s): 4.   Post op day(s):  Marland Kitchen.   Interval History: Patient seen and examined, no acute events or new complaints overnight. Patient reports she continues to feel well with ongoing +flatus and +BM's, tolerating clear liquids diet, denies abdominal pain, N/V, fever/chills, CP, or SOB and has been ambulating.  Review of Systems:  Constitutional: denies fever, chills  HEENT: denies cough or congestion  Respiratory: denies any shortness of breath  Cardiovascular: denies chest pain or palpitations  Gastrointestinal: abdominal pain, N/V, and bowel function as per interval history Genitourinary: denies burning with urination or urinary frequency Musculoskeletal: denies pain, decreased motor or sensation Integumentary: denies any other rashes or skin discolorations Neurological: denies HA or vision/hearing changes   Vital signs in last 24 hours: [min-max] current  Temp:  [97.5 F (36.4 C)-98.2 F (36.8 C)] 97.5 F (36.4 C) (06/16 0413) Pulse Rate:  [61-79] 61 (06/16 0413) Resp:  [16-20] 20 (06/16 0413) BP: (111-121)/(69-83) 121/78 (06/16 0413) SpO2:  [94 %-96 %] 95 % (06/16 0413)     Height: 5\' 1"  (154.9 cm) Weight: 111 lb (50.3 kg) BMI (Calculated): 20.98   Intake/Output this shift:  No intake/output data recorded.   Intake/Output last 2 shifts:  @IOLAST2SHIFTS @   Physical Exam:  Constitutional: alert, cooperative and no distress  HENT: normocephalic without obvious abnormality  Eyes: PERRL, EOM's grossly intact and symmetric  Neuro: CN II - XII grossly intact and symmetric without deficit  Respiratory: breathing non-labored at rest  Cardiovascular: regular rate and sinus rhythm  Gastrointestinal: soft and completely non-tender, minimal lower abdominal distention Musculoskeletal: UE and LE FROM, no edema or wounds, motor and sensation grossly intact, NT   Labs:  CBC Latest Ref Rng & Units 08/27/2017 08/26/2017 08/25/2017  WBC 3.6 -  11.0 K/uL 10.7 14.5(H) 10.1  Hemoglobin 12.0 - 16.0 g/dL 60.413.3 54.014.1 98.114.0  Hematocrit 35.0 - 47.0 % 38.8 41.0 39.9  Platelets 150 - 440 K/uL 187 190 190   CMP Latest Ref Rng & Units 08/27/2017 08/26/2017 08/25/2017  Glucose 65 - 99 mg/dL 191(Y144(H) 782(N193(H) 562(Z124(H)  BUN 6 - 20 mg/dL 11 20 18   Creatinine 0.44 - 1.00 mg/dL 3.080.55 6.570.74 8.460.61  Sodium 135 - 145 mmol/L 137 144 139  Potassium 3.5 - 5.1 mmol/L 3.5 3.7 3.2(L)  Chloride 101 - 111 mmol/L 105 105 102  CO2 22 - 32 mmol/L 28 30 23   Calcium 8.9 - 10.3 mg/dL 9.6(E8.4(L) 9.5 9.7  Total Protein 6.5 - 8.1 g/dL - - 7.2  Total Bilirubin 0.3 - 1.2 mg/dL - - 1.3(H)  Alkaline Phos 38 - 126 U/L - - 55  AST 15 - 41 U/L - - 29  ALT 14 - 54 U/L - - 21   Imaging studies: No new pertinent imaging studies   Assessment/Plan: (ICD-10's: K56.52) 83y.o.femalewithagain resolving multiply short-intervalrecurrentcomplete small bowel obstruction, likely attributable to post-surgical adhesions followingpartial colectomy for a polyp, appendectomy, and tubal ligation, complicated by pertinent comorbidities includingthyroid disease (not otherwise specified), GERD, and 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  - will advance diet as tolerated - monitor ongoing bowel function and abdominal exam - patient expresses preference to be discharged home with close outpatient surgical follow-up and elective lysis of adhesions - surgical intervention if doesn't improve was alsodiscussed - medical management ofcomorbidities, DVT prophylaxis  - anticipate discharge home tomorrow - ambulation encouraged  All of the above recommendations were discussed with the patientandherfamily, and all of patient's and her family's questions were  answered to their expressed satisfaction.  -- Scherrie Gerlach Earlene Plater, MD, RPVI [place]: [facility] General Surgery - Partnering for exceptional care. Office:  [phone]

## 2017-08-30 NOTE — Progress Notes (Signed)
Up walking in room a  Lot. Tolerating full  Liq diet so far.

## 2017-09-07 ENCOUNTER — Telehealth: Payer: Self-pay

## 2017-09-07 ENCOUNTER — Ambulatory Visit (INDEPENDENT_AMBULATORY_CARE_PROVIDER_SITE_OTHER): Payer: Medicare Other | Admitting: Surgery

## 2017-09-07 ENCOUNTER — Encounter: Payer: Self-pay | Admitting: Surgery

## 2017-09-07 VITALS — BP 111/70 | HR 84 | Temp 98.1°F | Ht 61.0 in | Wt 112.2 lb

## 2017-09-07 DIAGNOSIS — K56609 Unspecified intestinal obstruction, unspecified as to partial versus complete obstruction: Secondary | ICD-10-CM | POA: Diagnosis not present

## 2017-09-07 NOTE — Progress Notes (Signed)
Surgical Clinic Progress/Follow-up Note   HPI:  83 y.o. Female presents to clinic for follow-up evaluation of multiply recurrent short-interval small bowel obstructions, for which she was most recently discharged from [facility] 1 week ago. Patient reports she continues to pass both +flatus and +BM's, has been tolerating her diet without abdominal pain or N/V, but having experienced at least 4 obstructions that have required admission and non-operative nasogastric decompression (the last 3 of which have occurred every 2 months), she remains anxious about how soon she will again experience similar and expresses her wish to proceed with elective open lysis of adhesions with the hope to reduce her risk of such frequent bowel obstructions. She otherwise denies any fever/chills and describes she is able to ambulate and walk up/down a flight of steps without any CP or SOB.  Review of Systems:  Constitutional: denies any other weight loss, fever, chills, or sweats  Eyes: denies any other vision changes, history of eye injury  ENT: denies sore throat, hearing problems  Respiratory: denies shortness of breath, wheezing  Cardiovascular: denies chest pain, palpitations  Gastrointestinal: abdominal pain, N/V, and bowel function as per HPI Musculoskeletal: denies any other joint pains or cramps  Skin: Denies any other rashes or skin discolorations  Neurological: denies any other headache, dizziness, weakness  Psychiatric: denies any other depression, anxiety  All other review of systems: otherwise negative   Vital Signs:  BP 111/70   Pulse 84   Temp 98.1 F (36.7 C) (Oral)   Ht 5\' 1"  (1.549 m)   Wt 112 lb 3.2 oz (50.9 kg)   BMI 21.20 kg/m    Physical Exam:  Constitutional:  -- Normal body habitus  -- Awake, alert, and oriented x3  Eyes:  -- Pupils equally round and reactive to light  -- No scleral icterus  Ear, nose, throat:  -- No jugular venous distension  -- No nasal drainage,  bleeding Pulmonary:  -- No crackles -- Equal breath sounds bilaterally -- Breathing non-labored at rest Cardiovascular:  -- S1, S2 present  -- No pericardial rubs  Gastrointestinal:  -- Soft, nontender, non-distended, no guarding/rebound  -- No abdominal masses appreciated, pulsatile or otherwise  Musculoskeletal / Integumentary:  -- Wounds or skin discoloration: No open wounds appreciated  -- Extremities: B/L UE and LE FROM, hands and feet warm, no edema  Neurologic:  -- Motor function: intact and symmetric  -- Sensation: intact and symmetric   Assessment:  83 y.o. yo Female with a problem list including...  Patient Active Problem List   Diagnosis Date Noted  . Cervical strain 07/24/2017  . SBO (small bowel obstruction) (HCC) 06/30/2017  . Small bowel obstruction due to postoperative adhesions 06/29/2017  . Malnutrition of mild degree (HCC) 11/21/2016  . Midgut volvulus   . Other idiopathic scoliosis, thoracolumbar region 07/16/2016  . Oral phase dysphagia 09/26/2015  . Acid reflux 09/04/2015  . Chronic abdominal pain 09/04/2015  . Hyperlipidemia 09/04/2015  . Hypothyroidism 09/04/2015    presents to clinic for follow-up evaluation s/p multiply recurrent SBO's attributed to post-surgical adhesions and to further discuss and schedule elective open lysis of adhesions.  Plan:   - signs and symptoms of obstruction reviewed with patient and her husband  - all risks, benefits, and alternatives to laparotomy with open lysis of adhesions and anticipated recovery were discussed with the patient her husband (as have previously been likewise discussed with her children as well), all of their questions were answered to their expressed satisfaction, patient expresses she  wishes to proceed, and informed consent was obtained.  - will plan for elective open lysis of adhesions on 7/15 per patient request pending medical risk stratification and optimization  - anticipate return to clinic 2  weeks following above planned procedure  - instructed to call office if any questions or concerns  All of the above recommendations were discussed with the patient and patient's family, and all of patient's and family's questions were answered to their expressed satisfaction.  -- Scherrie GerlachJason E. Earlene Plateravis, MD, RPVI [place]: [facility] General Surgery - Partnering for exceptional care. Office: [phone]

## 2017-09-07 NOTE — H&P (View-Only) (Signed)
Surgical Clinic Progress/Follow-up Note   HPI:  83 y.o. Female presents to clinic for follow-up evaluation of multiply recurrent short-interval small bowel obstructions, for which she was most recently discharged from [facility] 1 week ago. Patient reports she continues to pass both +flatus and +BM's, has been tolerating her diet without abdominal pain or N/V, but having experienced at least 4 obstructions that have required admission and non-operative nasogastric decompression (the last 3 of which have occurred every 2 months), she remains anxious about how soon she will again experience similar and expresses her wish to proceed with elective open lysis of adhesions with the hope to reduce her risk of such frequent bowel obstructions. She otherwise denies any fever/chills and describes she is able to ambulate and walk up/down a flight of steps without any CP or SOB.  Review of Systems:  Constitutional: denies any other weight loss, fever, chills, or sweats  Eyes: denies any other vision changes, history of eye injury  ENT: denies sore throat, hearing problems  Respiratory: denies shortness of breath, wheezing  Cardiovascular: denies chest pain, palpitations  Gastrointestinal: abdominal pain, N/V, and bowel function as per HPI Musculoskeletal: denies any other joint pains or cramps  Skin: Denies any other rashes or skin discolorations  Neurological: denies any other headache, dizziness, weakness  Psychiatric: denies any other depression, anxiety  All other review of systems: otherwise negative   Vital Signs:  BP 111/70   Pulse 84   Temp 98.1 F (36.7 C) (Oral)   Ht 5' 1" (1.549 m)   Wt 112 lb 3.2 oz (50.9 kg)   BMI 21.20 kg/m    Physical Exam:  Constitutional:  -- Normal body habitus  -- Awake, alert, and oriented x3  Eyes:  -- Pupils equally round and reactive to light  -- No scleral icterus  Ear, nose, throat:  -- No jugular venous distension  -- No nasal drainage,  bleeding Pulmonary:  -- No crackles -- Equal breath sounds bilaterally -- Breathing non-labored at rest Cardiovascular:  -- S1, S2 present  -- No pericardial rubs  Gastrointestinal:  -- Soft, nontender, non-distended, no guarding/rebound  -- No abdominal masses appreciated, pulsatile or otherwise  Musculoskeletal / Integumentary:  -- Wounds or skin discoloration: No open wounds appreciated  -- Extremities: B/L UE and LE FROM, hands and feet warm, no edema  Neurologic:  -- Motor function: intact and symmetric  -- Sensation: intact and symmetric   Assessment:  83 y.o. yo Female with a problem list including...  Patient Active Problem List   Diagnosis Date Noted  . Cervical strain 07/24/2017  . SBO (small bowel obstruction) (HCC) 06/30/2017  . Small bowel obstruction due to postoperative adhesions 06/29/2017  . Malnutrition of mild degree (HCC) 11/21/2016  . Midgut volvulus   . Other idiopathic scoliosis, thoracolumbar region 07/16/2016  . Oral phase dysphagia 09/26/2015  . Acid reflux 09/04/2015  . Chronic abdominal pain 09/04/2015  . Hyperlipidemia 09/04/2015  . Hypothyroidism 09/04/2015    presents to clinic for follow-up evaluation s/p multiply recurrent SBO's attributed to post-surgical adhesions and to further discuss and schedule elective open lysis of adhesions.  Plan:   - signs and symptoms of obstruction reviewed with patient and her husband  - all risks, benefits, and alternatives to laparotomy with open lysis of adhesions and anticipated recovery were discussed with the patient her husband (as have previously been likewise discussed with her children as well), all of their questions were answered to their expressed satisfaction, patient expresses she   wishes to proceed, and informed consent was obtained.  - will plan for elective open lysis of adhesions on 7/15 per patient request pending medical risk stratification and optimization  - anticipate return to clinic 2  weeks following above planned procedure  - instructed to call office if any questions or concerns  All of the above recommendations were discussed with the patient and patient's family, and all of patient's and family's questions were answered to their expressed satisfaction.  -- Tanay Massiah E. Lelon Ikard, MD, RPVI [place]: [facility] General Surgery - Partnering for exceptional care. Office: [phone] 

## 2017-09-07 NOTE — Telephone Encounter (Signed)
Flagged on EMMI report for not knowing who to call about changes in condition.  Called and spoke with patient.  She reports she has an appointment with Dr. Satira MccallumJason Davis this afternoon.  Encouraged her to call Dr. Earlene Plateravis' office, or Dr.  Karle StarchLetvak's office should she have any condition changes or issues.  No questions or concerns regarding her recent discharge.  I thanked her for her time.

## 2017-09-08 ENCOUNTER — Encounter: Payer: Self-pay | Admitting: Surgery

## 2017-09-08 NOTE — Addendum Note (Signed)
Addended by: Chrisandra NettersAVIS, Antawan Mchugh E on: 09/08/2017 06:18 AM   Modules accepted: Orders, SmartSet

## 2017-09-09 ENCOUNTER — Telehealth: Payer: Self-pay

## 2017-09-09 NOTE — Telephone Encounter (Signed)
Medical clearance have been faxed to Dr. Karle Starchletvak's office. Patient has surgery scheduled to be on 09/28/2017 by Dr. Earlene Plateravis. Awaiting on response.

## 2017-09-11 ENCOUNTER — Telehealth: Payer: Self-pay | Admitting: Surgery

## 2017-09-11 NOTE — Telephone Encounter (Signed)
Pt advised of pre op date/time and sx date. Sx: 09/28/17 with Dr Davis/Oaks-lysis of adhesion.  Pre op: 09/18/17 @ 9:00pm.  Patient made aware to call [phone], between 1-3:00pm the day before surgery, to find out what time to arrive.

## 2017-09-11 NOTE — Telephone Encounter (Signed)
While informing patient of her surgery information, patient stated that she has an appointment with Dr Karle StarchLetvak's office on 09/23/17 @ 11:00am. Patient wanted me to tell you. I did let her know that you have sent the clearance form to that office.

## 2017-09-12 NOTE — Discharge Summary (Signed)
Physician Discharge Summary  Patient ID: Diana Owen MRN: [id number] DOB/AGE: 1934/04/24 83 y.o.  Admit date: 08/25/2017 Discharge date: 08/30/2017  Admission Diagnoses:  Discharge Diagnoses:  Active Problems:   SBO (small bowel obstruction) (HCC)   Discharged Condition: good  Hospital Course: 83 y.o. female [facility] ED for abdominal pain with nausea and non-bloody emesis. Workup was found to be significant for CT imaging demonstrating small bowel obstruction. NG tube was placed to low intermittent suction, after which patient's symptoms all resolved with resumed bowel function. Patient's NG tube was accordingly removed, and her diet was advanced. The remainder of patient's hospital course was essentially unremarkable, and discharge planning was initiated accordingly with patient safely able to be discharged home with appropriate discharge instructions and outpatient surgical follow-up after all of her and family's questions were answered to their expressed satisfaction.  Consults: None  Significant Diagnostic Studies: radiology: CT scan: small bowel obstruction  Treatments: IV hydration and NG tube for nasogastric decompression  Discharge Exam: Blood pressure 114/66, pulse 69, temperature 98.3 F (36.8 C), temperature source Oral, resp. rate 16, height 5\' 1"  (1.549 m), weight 111 lb (50.3 kg), SpO2 99 %. General appearance: alert, cooperative and no distress GI: soft, non-tender; bowel sounds normal; no masses,  no organomegaly  Disposition:    Allergies as of 08/30/2017      Reactions   Codeine Nausea And Vomiting   Latex Rash      Medication List    TAKE these medications   albuterol 108 (90 Base) MCG/ACT inhaler Commonly known as:  PROVENTIL HFA;VENTOLIN HFA Inhale 2 puffs into the lungs every 4 (four) hours as needed for wheezing or shortness of breath.   cholecalciferol 1000 units tablet Commonly known as:  VITAMIN D Take 1,000 Units by mouth daily.    levothyroxine 100 MCG tablet Commonly known as:  SYNTHROID, LEVOTHROID Take 1 tablet (100 mcg total) by mouth daily.   multivitamin with minerals tablet Take 1 tablet by mouth daily.   polyethylene glycol packet Commonly known as:  MIRALAX / GLYCOLAX Take 17 g by mouth daily.   SF 5000 PLUS 1.1 % Crea dental cream Generic drug:  sodium fluoride APPLY A PEA-SIZED AMOUNT TO TOOTHBRUSH IN PLACE OF NORMAL PASTE; ...  (REFER TO PRESCRIPTION NOTES).   tiZANidine 2 MG tablet Commonly known as:  ZANAFLEX Take 1 tablet (2 mg total) by mouth at bedtime as needed for muscle spasms.   Vitamin B-12 6000 MCG Subl Place 1 tablet under the tongue daily.   vitamin E 100 UNIT capsule Take 100 Units by mouth daily.      Follow-up Information    Ancil Linseyavis, Sherley Leser Evan, MD. Schedule an appointment as soon as possible for a visit.   Specialty:  General Surgery Why:  as soon as possible to discuss, prepare for, and schedule surgery. Contact information: [address] [place] [address] [phone]           Signed: Ancil LinseyJason Evan Diana Owen 09/12/2017, 11:55 PM

## 2017-09-18 ENCOUNTER — Inpatient Hospital Stay: Admission: RE | Admit: 2017-09-18 | Payer: Medicare Other | Source: Ambulatory Visit

## 2017-09-23 ENCOUNTER — Encounter: Payer: Self-pay | Admitting: Licensed Clinical Social Worker

## 2017-09-23 ENCOUNTER — Encounter: Payer: Self-pay | Admitting: Internal Medicine

## 2017-09-23 ENCOUNTER — Ambulatory Visit (INDEPENDENT_AMBULATORY_CARE_PROVIDER_SITE_OTHER): Payer: Medicare Other | Admitting: Internal Medicine

## 2017-09-23 VITALS — BP 112/72 | HR 57 | Temp 98.0°F | Ht 61.0 in | Wt 112.0 lb

## 2017-09-23 DIAGNOSIS — R1310 Dysphagia, unspecified: Secondary | ICD-10-CM | POA: Diagnosis not present

## 2017-09-23 DIAGNOSIS — Z01818 Encounter for other preprocedural examination: Secondary | ICD-10-CM | POA: Diagnosis not present

## 2017-09-23 DIAGNOSIS — R1319 Other dysphagia: Secondary | ICD-10-CM

## 2017-09-23 MED ORDER — OMEPRAZOLE 20 MG PO CPDR: 20.0000 mg | DELAYED_RELEASE_CAPSULE | Freq: Every day | ORAL | 11 refills | Status: DC

## 2017-09-23 NOTE — Assessment & Plan Note (Signed)
No cardiac or lung disease No symptoms of recent issues and no infection EKG 6/11--sinus, LAD, possible RVH but no ischemic changes  Appears to be medically optimized for upcoming surgery Would use standard post op DVT precautions (and lovenox if prolonged bed rest needed)

## 2017-09-23 NOTE — Telephone Encounter (Signed)
We have recieved patients medical clearance for surgery from Dr. Alphonsus SiasLetvak, patient has been granted for surgery. clearnace has been placed in patient chart.

## 2017-09-23 NOTE — Assessment & Plan Note (Signed)
Omeprazole stopped in the past due to concerns about side effects Will restart May need to consider GI evaluation for EGD

## 2017-09-23 NOTE — Progress Notes (Signed)
Subjective:    Patient ID: Diana ReelMarie Owen, female    DOB: Nov 05, 1934, 83 y.o.   MRN: [id number]  HPI Here for preop medical clearance  Has had recurrent intestinal obstructions so surgery planned  Has felt well other than the recurrent obstruction hospitalizations Exercises twice a week (in class)--plus active otherwise No change in exercise tolerance No chest pain No SOB No dizziness or syncope No edema  No recent cough or fever No problems with anaesthesia---some mental cloudiness after though  Chronic back problems with spine  Uses lumbar support with some success  Current Outpatient Medications on File Prior to Visit  Medication Sig Dispense Refill  . cholecalciferol (VITAMIN D) 1000 units tablet Take 1,000 Units by mouth daily.    . Cyanocobalamin (VITAMIN B-12) 6000 MCG SUBL Place 1 tablet under the tongue daily.    Marland Kitchen. levothyroxine (SYNTHROID, LEVOTHROID) 100 MCG tablet Take 1 tablet (100 mcg total) by mouth daily. 90 tablet 3  . Multiple Vitamins-Minerals (MULTIVITAMIN WITH MINERALS) tablet Take 1 tablet by mouth daily.    . polyethylene glycol (MIRALAX / GLYCOLAX) packet Take 17 g by mouth daily.    . SF 5000 PLUS 1.1 % CREA dental cream APPLY A PEA-SIZED AMOUNT TO TOOTHBRUSH IN PLACE OF NORMAL PASTE; ...  (REFER TO PRESCRIPTION NOTES).  0  . vitamin E 100 UNIT capsule Take 100 Units by mouth daily.    Marland Kitchen. albuterol (PROVENTIL HFA;VENTOLIN HFA) 108 (90 Base) MCG/ACT inhaler Inhale 2 puffs into the lungs every 4 (four) hours as needed for wheezing or shortness of breath.      No current facility-administered medications on file prior to visit.     Allergies  Allergen Reactions  . Codeine Nausea And Vomiting  . Latex Rash    Past Medical History:  Diagnosis Date  . GERD (gastroesophageal reflux disease)   . Intractable vomiting with nausea   . Midgut volvulus   . SBO (small bowel obstruction) (HCC) 11/13/2016  . Small bowel obstruction (HCC)   . Thyroid disease      Past Surgical History:  Procedure Laterality Date  . APPENDECTOMY    . PARTIAL COLECTOMY     large polyp  . TONSILLECTOMY    . TUBAL LIGATION      Family History  Problem Relation Age of Onset  . Leukemia Mother        CLL  . Leukemia Father   . Hyperlipidemia Brother   . Stroke Brother   . Heart disease Paternal Uncle   . Diabetes Neg Hx     Social History   Socioeconomic History  . Marital status: Married    Spouse name: Not on file  . Number of children: 3  . Years of education: Not on file  . Highest education level: Not on file  Occupational History  . 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 Financial resource strain: Not on file  . Food insecurity:    Worry: Not on file    Inability: Not on file  . Transportation needs:    Medical: Not on file    Non-medical: Not on file  Tobacco Use  . Smoking status: Never Smoker  . Smokeless tobacco: Never Used  Substance and Sexual Activity  . Alcohol use: Yes    Comment: rare wine   . Drug use: No  . Sexual activity: Yes  Lifestyle  . Physical activity:    Days per  week: Not on file    Minutes per session: Not on file  . Stress: Not on file  Relationships  . Social connections:    Talks on phone: Not on file    Gets together: Not on file    Attends religious service: Not on file    Active member of club or organization: Not on file    Attends meetings of clubs or organizations: Not on file    Relationship status: Not on file  . Intimate partner violence:    Fear of current or ex partner: Not on file    Emotionally abused: Not on file    Physically abused: Not on file    Forced sexual activity: Not on file  Other Topics Concern  . Not on file  Social History Narrative   2 sons, 1 daughter--- Brett Canales in [place] in [place], Selinda Orion in [place]      Has a living will   Daughter [name], then sons, should make health care decisions   Would accept  resuscitation attempts   Not sure about tube feeds   Review of Systems No set heartburn--but has delayed swallowing--feels it right at her xiphoid Has to stand up while eating---to help the passage of food Sleeps okay Appetite is okay--but limits quantity    Objective:   Physical Exam  Constitutional: She appears well-developed. No distress.  Neck: No thyromegaly present.  Cardiovascular: Normal rate, regular rhythm, normal heart sounds and intact distal pulses. Exam reveals no gallop.  No murmur heard. Respiratory: Effort normal and breath sounds normal. No respiratory distress. She has no wheezes. She has no rales.  GI: Soft. She exhibits no distension. There is no tenderness. There is no rebound and no guarding.  Musculoskeletal: She exhibits no edema.  Lymphadenopathy:    She has no cervical adenopathy.  Psychiatric: She has a normal mood and affect. Her behavior is normal.           Assessment & Plan:

## 2017-09-24 ENCOUNTER — Encounter
Admission: RE | Admit: 2017-09-24 | Discharge: 2017-09-24 | Disposition: A | Discharge status: Home / Self Care | Payer: Medicare Other | Source: Ambulatory Visit | Attending: Surgery | Admitting: Surgery

## 2017-09-24 ENCOUNTER — Ambulatory Visit
Admission: RE | Admit: 2017-09-24 | Discharge: 2017-09-24 | Disposition: A | Discharge status: Home / Self Care | Payer: Medicare Other | Source: Ambulatory Visit | Attending: Surgery | Admitting: Surgery

## 2017-09-24 ENCOUNTER — Other Ambulatory Visit: Payer: Self-pay

## 2017-09-24 DIAGNOSIS — Q2546 Tortuous aortic arch: Secondary | ICD-10-CM | POA: Diagnosis not present

## 2017-09-24 DIAGNOSIS — K56609 Unspecified intestinal obstruction, unspecified as to partial versus complete obstruction: Secondary | ICD-10-CM | POA: Insufficient documentation

## 2017-09-24 DIAGNOSIS — Z01811 Encounter for preprocedural respiratory examination: Secondary | ICD-10-CM

## 2017-09-24 DIAGNOSIS — I7090 Unspecified atherosclerosis: Secondary | ICD-10-CM | POA: Diagnosis not present

## 2017-09-24 DIAGNOSIS — R079 Chest pain, unspecified: Secondary | ICD-10-CM | POA: Diagnosis not present

## 2017-09-24 HISTORY — DX: Personal history of other diseases of the digestive system: Z87.19

## 2017-09-24 HISTORY — DX: Unspecified osteoarthritis, unspecified site: M19.90

## 2017-09-24 HISTORY — DX: Hypothyroidism, unspecified: E03.9

## 2017-09-24 HISTORY — DX: Anxiety disorder, unspecified: F41.9

## 2017-09-24 HISTORY — DX: Barrett's esophagus without dysplasia: K22.70

## 2017-09-24 NOTE — Patient Instructions (Signed)
Your procedure is scheduled on: Monday, September 28, 2017  Report to THE SECOND FLOOR OF THE MEDICAL MALL   DO NOT STOP ON THE SECOND FLOOR TO REGISTER.   To find out your arrival time please call [phone] between 1PM - 3PM on Friday, September 25, 2017  Remember: Instructions that are not followed completely may result in serious medical risk,  up to and including death, or upon the discretion of your surgeon and anesthesiologist your  surgery may need to be rescheduled.     _X__ 1. Do not eat food after midnight the night before your procedure.                 No gum chewing or hard candies. ABSOLUTELY NOTHING SOLID IN YOUR MOUTH AFTER MIDNIGHT                 You may drink clear liquids up to 2 hours Before you are scheduled to arrive for your surgery-                  DO not drink clear liquids within 2 hours of the start of your surgery.                  Clear Liquids include:  water, apple juice without pulp, clear carbohydrate                 drink such as Clearfast of Gatorade, Black Coffee or Tea (Do not add                 anything to coffee or tea).  __X__2.  On the morning of surgery brush your teeth with toothpaste and water,                  You May rinse your mouth with mouthwash if you wish.                   Do not swallow any toothpaste of mouthwash.     _X__ 3.  No Alcohol for 24 hours before or after surgery.   _X__ 4.  Do Not Smoke or use e-cigarettes For 24 Hours Prior to Your Surgery.                 Do not use any chewable tobacco products for at least 6 hours prior to                 surgery.  ____  5.  Bring all medications with you on the day of surgery if instructed.   ____  6.  Notify your doctor if there is any change in your medical condition      (cold, fever, infections).     Do not wear jewelry, make-up, hairpins, clips or nail polish. Do not wear lotions, powders, or perfumes. You may wear deodorant. Do not shave 48 hours  prior to surgery. Men may shave face and neck. Do not bring valuables to the hospital.    [facility] is not responsible for any belongings or valuables.  Contacts, dentures or bridgework may not be worn into surgery. Leave your suitcase in the car. After surgery it may be brought to your room. For patients admitted to the hospital, discharge time is determined by your treatment team.   Patients discharged the day of surgery will not be allowed to drive home.   Please read over the following fact sheets that you were given:   PREPARING  FOR SURGERY   ____ Take these medicines the morning of surgery with A SIP OF WATER:    1. SYNTHROID  2.  PRILOSEC  3.   4.  5.  6.  ____ Fleet Enema (as directed)   __X__ Use CHG Soap as directed  _X___ Stop ALL ASPIRIN PRODUCTS AS OF TODAY. THIS INCLUDES EXCEDRIN   _X___ Stop Anti-inflammatories AS OF NOW.             THIS INCLUDES IBUPROFEN / MOTRIN / ADVIL / ALEVE           YOU MAY TAKE TYLENOL AT ANY TIME PRIOR TO SURGERY   _X___ Stop supplements until after surgery.               STOP TAKING MULTIVITAMINS AND VITAMIN E TODAY.           CONTINUE TAKING THE VITAMIN D AND VITAMIN B12 BUT DO NOT TAKE ON THE DAY OF SURGERY           ALSO, YOU MAY CONTINUE MIRALAX AS USUAL JUST DO NOT TAKE ON THE MORNING OF SURGERY  ____ Bring C-Pap to the hospital.   BRING A COPY OF YOUR POA PAPERS TO SURGERY SO WE CAN MAKE A COPY FOR YOUR CHART.  BRING COMFORTABLE SLIPPERS / STURDY SHOES FOR YOUR STAY

## 2017-09-28 ENCOUNTER — Encounter
Admission: RE | Disposition: A | Discharge status: Home / Self Care | Payer: Self-pay | Source: Ambulatory Visit | Attending: Surgery

## 2017-09-28 ENCOUNTER — Inpatient Hospital Stay: Payer: Medicare Other | Admitting: Anesthesiology

## 2017-09-28 ENCOUNTER — Inpatient Hospital Stay
Admission: RE | Admit: 2017-09-28 | Discharge: 2017-10-01 | DRG: 337 | Disposition: A | Discharge status: Home / Self Care | Payer: Medicare Other | Source: Ambulatory Visit | Attending: Surgery | Admitting: Surgery

## 2017-09-28 ENCOUNTER — Other Ambulatory Visit: Payer: Self-pay

## 2017-09-28 ENCOUNTER — Encounter: Payer: Self-pay | Admitting: *Deleted

## 2017-09-28 DIAGNOSIS — K56609 Unspecified intestinal obstruction, unspecified as to partial versus complete obstruction: Secondary | ICD-10-CM

## 2017-09-28 DIAGNOSIS — Z9889 Other specified postprocedural states: Secondary | ICD-10-CM

## 2017-09-28 DIAGNOSIS — K5710 Diverticulosis of small intestine without perforation or abscess without bleeding: Secondary | ICD-10-CM | POA: Diagnosis present

## 2017-09-28 DIAGNOSIS — E785 Hyperlipidemia, unspecified: Secondary | ICD-10-CM | POA: Diagnosis present

## 2017-09-28 DIAGNOSIS — K5652 Intestinal adhesions [bands] with complete obstruction: Principal | ICD-10-CM

## 2017-09-28 DIAGNOSIS — Z79890 Hormone replacement therapy (postmenopausal): Secondary | ICD-10-CM

## 2017-09-28 DIAGNOSIS — K565 Intestinal adhesions [bands], unspecified as to partial versus complete obstruction: Secondary | ICD-10-CM | POA: Diagnosis not present

## 2017-09-28 DIAGNOSIS — Z79899 Other long term (current) drug therapy: Secondary | ICD-10-CM | POA: Diagnosis not present

## 2017-09-28 DIAGNOSIS — F419 Anxiety disorder, unspecified: Secondary | ICD-10-CM | POA: Diagnosis present

## 2017-09-28 DIAGNOSIS — K219 Gastro-esophageal reflux disease without esophagitis: Secondary | ICD-10-CM | POA: Diagnosis present

## 2017-09-28 DIAGNOSIS — M4125 Other idiopathic scoliosis, thoracolumbar region: Secondary | ICD-10-CM | POA: Diagnosis present

## 2017-09-28 DIAGNOSIS — E039 Hypothyroidism, unspecified: Secondary | ICD-10-CM | POA: Diagnosis present

## 2017-09-28 HISTORY — PX: LAPAROTOMY: SHX154

## 2017-09-28 SURGERY — LAPAROTOMY, EXPLORATORY
Anesthesia: General | Site: Abdomen | Wound class: "Clean Contaminated "

## 2017-09-28 MED ORDER — FENTANYL CITRATE (PF) 100 MCG/2ML IJ SOLN
INTRAMUSCULAR | Status: DC | PRN
  Administered 2017-09-28 (×2): 25 ug via INTRAVENOUS
  Administered 2017-09-28: 50 ug via INTRAVENOUS

## 2017-09-28 MED ORDER — ACETAMINOPHEN 500 MG PO TABS
1000.0000 mg | ORAL_TABLET | ORAL | Status: AC
  Administered 2017-09-28: 1000 mg via ORAL

## 2017-09-28 MED ORDER — FAMOTIDINE 20 MG PO TABS
20.0000 mg | ORAL_TABLET | Freq: Once | ORAL | Status: AC
  Administered 2017-09-28: 20 mg via ORAL

## 2017-09-28 MED ORDER — KETOROLAC TROMETHAMINE 15 MG/ML IJ SOLN
15.0000 mg | Freq: Four times a day (QID) | INTRAMUSCULAR | Status: DC | PRN
  Administered 2017-09-29 – 2017-10-01 (×7): 15 mg via INTRAVENOUS
  Filled 2017-09-28 (×7): qty 1

## 2017-09-28 MED ORDER — EPHEDRINE SULFATE 50 MG/ML IJ SOLN
INTRAMUSCULAR | Status: AC
  Filled 2017-09-28: qty 2

## 2017-09-28 MED ORDER — SUCCINYLCHOLINE CHLORIDE 20 MG/ML IJ SOLN
INTRAMUSCULAR | Status: AC
  Filled 2017-09-28: qty 1

## 2017-09-28 MED ORDER — ACETAMINOPHEN 160 MG/5ML PO SOLN: 325.0000 mg | ORAL | Status: DC | PRN

## 2017-09-28 MED ORDER — ONDANSETRON HCL 4 MG/2ML IJ SOLN
INTRAMUSCULAR | Status: AC
  Filled 2017-09-28: qty 2

## 2017-09-28 MED ORDER — ROCURONIUM BROMIDE 100 MG/10ML IV SOLN
INTRAVENOUS | Status: DC | PRN
  Administered 2017-09-28: 30 mg via INTRAVENOUS
  Administered 2017-09-28: 10 mg via INTRAVENOUS

## 2017-09-28 MED ORDER — PROPOFOL 10 MG/ML IV BOLUS
INTRAVENOUS | Status: AC
  Filled 2017-09-28: qty 20

## 2017-09-28 MED ORDER — BUPIVACAINE LIPOSOME 1.3 % IJ SUSP
INTRAMUSCULAR | Status: AC
  Filled 2017-09-28: qty 20

## 2017-09-28 MED ORDER — DEXAMETHASONE SODIUM PHOSPHATE 10 MG/ML IJ SOLN
INTRAMUSCULAR | Status: AC
  Filled 2017-09-28: qty 1

## 2017-09-28 MED ORDER — SUGAMMADEX SODIUM 200 MG/2ML IV SOLN
INTRAVENOUS | Status: AC
  Filled 2017-09-28: qty 2

## 2017-09-28 MED ORDER — EPHEDRINE SULFATE 50 MG/ML IJ SOLN
INTRAMUSCULAR | Status: DC | PRN
  Administered 2017-09-28: 5 mg via INTRAVENOUS

## 2017-09-28 MED ORDER — LIDOCAINE HCL (PF) 2 % IJ SOLN
INTRAMUSCULAR | Status: AC
  Filled 2017-09-28: qty 10

## 2017-09-28 MED ORDER — SUGAMMADEX SODIUM 200 MG/2ML IV SOLN
INTRAVENOUS | Status: DC | PRN
  Administered 2017-09-28: 100 mg via INTRAVENOUS

## 2017-09-28 MED ORDER — CEFAZOLIN SODIUM-DEXTROSE 2-4 GM/100ML-% IV SOLN
2.0000 g | Freq: Three times a day (TID) | INTRAVENOUS | Status: AC
  Administered 2017-09-28: 2 g via INTRAVENOUS
  Filled 2017-09-28: qty 100

## 2017-09-28 MED ORDER — MORPHINE SULFATE (PF) 2 MG/ML IV SOLN: 2.0000 mg | INTRAVENOUS | Status: DC | PRN

## 2017-09-28 MED ORDER — KCL IN DEXTROSE-NACL 20-5-0.45 MEQ/L-%-% IV SOLN
INTRAVENOUS | Status: DC
  Administered 2017-09-28 – 2017-10-01 (×5): via INTRAVENOUS
  Filled 2017-09-28 (×8): qty 1000

## 2017-09-28 MED ORDER — FAMOTIDINE 20 MG PO TABS
ORAL_TABLET | ORAL | Status: AC
  Administered 2017-09-28: 20 mg via ORAL
  Filled 2017-09-28: qty 1

## 2017-09-28 MED ORDER — MEPERIDINE HCL 50 MG/ML IJ SOLN: 6.2500 mg | INTRAMUSCULAR | Status: DC | PRN

## 2017-09-28 MED ORDER — BUPIVACAINE HCL (PF) 0.5 % IJ SOLN
INTRAMUSCULAR | Status: AC
  Filled 2017-09-28: qty 30

## 2017-09-28 MED ORDER — CEFAZOLIN SODIUM-DEXTROSE 2-4 GM/100ML-% IV SOLN
INTRAVENOUS | Status: AC
  Filled 2017-09-28: qty 100

## 2017-09-28 MED ORDER — LACTATED RINGERS IV SOLN
INTRAVENOUS | Status: DC
  Administered 2017-09-28: 08:00:00 via INTRAVENOUS

## 2017-09-28 MED ORDER — GLYCOPYRROLATE 0.2 MG/ML IJ SOLN
INTRAMUSCULAR | Status: AC
  Filled 2017-09-28: qty 1

## 2017-09-28 MED ORDER — ONDANSETRON HCL 4 MG/2ML IJ SOLN
INTRAMUSCULAR | Status: DC | PRN
  Administered 2017-09-28: 4 mg via INTRAVENOUS

## 2017-09-28 MED ORDER — BUPIVACAINE LIPOSOME 1.3 % IJ SUSP
20.0000 mL | Freq: Once | INTRAMUSCULAR | Status: DC
  Filled 2017-09-28: qty 20

## 2017-09-28 MED ORDER — GABAPENTIN 300 MG PO CAPS
ORAL_CAPSULE | ORAL | Status: AC
  Administered 2017-09-28: 300 mg via ORAL
  Filled 2017-09-28: qty 1

## 2017-09-28 MED ORDER — BUPIVACAINE HCL (PF) 0.5 % IJ SOLN
INTRAMUSCULAR | Status: DC | PRN
  Administered 2017-09-28: 30 mL

## 2017-09-28 MED ORDER — FENTANYL CITRATE (PF) 100 MCG/2ML IJ SOLN
INTRAMUSCULAR | Status: AC
  Filled 2017-09-28: qty 2

## 2017-09-28 MED ORDER — ONDANSETRON HCL 4 MG/2ML IJ SOLN: 4.0000 mg | Freq: Four times a day (QID) | INTRAMUSCULAR | Status: DC | PRN

## 2017-09-28 MED ORDER — ONDANSETRON 4 MG PO TBDP: 4.0000 mg | ORAL_TABLET | Freq: Four times a day (QID) | ORAL | Status: DC | PRN

## 2017-09-28 MED ORDER — ACETAMINOPHEN 325 MG PO TABS: 325.0000 mg | ORAL_TABLET | ORAL | Status: DC | PRN

## 2017-09-28 MED ORDER — KETOROLAC TROMETHAMINE 15 MG/ML IJ SOLN
15.0000 mg | Freq: Four times a day (QID) | INTRAMUSCULAR | Status: AC
  Administered 2017-09-28: 15 mg via INTRAVENOUS
  Filled 2017-09-28: qty 1

## 2017-09-28 MED ORDER — GLYCOPYRROLATE 0.2 MG/ML IJ SOLN
INTRAMUSCULAR | Status: DC | PRN
  Administered 2017-09-28: 0.2 mg via INTRAVENOUS

## 2017-09-28 MED ORDER — PHENYLEPHRINE HCL 10 MG/ML IJ SOLN
INTRAMUSCULAR | Status: AC
  Filled 2017-09-28: qty 1

## 2017-09-28 MED ORDER — ACETAMINOPHEN 500 MG PO TABS
ORAL_TABLET | ORAL | Status: AC
  Administered 2017-09-28: 1000 mg via ORAL
  Filled 2017-09-28: qty 2

## 2017-09-28 MED ORDER — FENTANYL CITRATE (PF) 100 MCG/2ML IJ SOLN: 25.0000 ug | INTRAMUSCULAR | Status: DC | PRN

## 2017-09-28 MED ORDER — LIDOCAINE HCL (CARDIAC) PF 100 MG/5ML IV SOSY
PREFILLED_SYRINGE | INTRAVENOUS | Status: DC | PRN
  Administered 2017-09-28: 40 mg via INTRAVENOUS

## 2017-09-28 MED ORDER — GABAPENTIN 300 MG PO CAPS
300.0000 mg | ORAL_CAPSULE | ORAL | Status: AC
  Administered 2017-09-28: 300 mg via ORAL

## 2017-09-28 MED ORDER — ROCURONIUM BROMIDE 50 MG/5ML IV SOLN
INTRAVENOUS | Status: AC
  Filled 2017-09-28: qty 1

## 2017-09-28 MED ORDER — PHENYLEPHRINE HCL 10 MG/ML IJ SOLN
INTRAMUSCULAR | Status: DC | PRN
  Administered 2017-09-28: 100 ug via INTRAVENOUS

## 2017-09-28 MED ORDER — PROMETHAZINE HCL 25 MG/ML IJ SOLN: 6.2500 mg | INTRAMUSCULAR | Status: DC | PRN

## 2017-09-28 MED ORDER — CHLORHEXIDINE GLUCONATE CLOTH 2 % EX PADS: 6.0000 | MEDICATED_PAD | Freq: Once | CUTANEOUS | Status: DC

## 2017-09-28 MED ORDER — MENTHOL 3 MG MT LOZG
1.0000 | LOZENGE | OROMUCOSAL | Status: DC | PRN
  Administered 2017-09-29: 3 mg via ORAL
  Filled 2017-09-28: qty 9

## 2017-09-28 MED ORDER — DEXAMETHASONE SODIUM PHOSPHATE 10 MG/ML IJ SOLN
INTRAMUSCULAR | Status: DC | PRN
  Administered 2017-09-28: 5 mg via INTRAVENOUS

## 2017-09-28 MED ORDER — BUPIVACAINE LIPOSOME 1.3 % IJ SUSP
INTRAMUSCULAR | Status: DC | PRN
  Administered 2017-09-28: 20 mL

## 2017-09-28 MED ORDER — CEFAZOLIN SODIUM-DEXTROSE 2-4 GM/100ML-% IV SOLN
2.0000 g | INTRAVENOUS | Status: AC
  Administered 2017-09-28: 2 g via INTRAVENOUS

## 2017-09-28 MED ORDER — SODIUM CHLORIDE 0.9 % IJ SOLN
INTRAMUSCULAR | Status: AC
  Filled 2017-09-28: qty 100

## 2017-09-28 MED ORDER — ENOXAPARIN SODIUM 40 MG/0.4ML ~~LOC~~ SOLN
40.0000 mg | SUBCUTANEOUS | Status: DC
  Administered 2017-09-29 – 2017-10-01 (×3): 40 mg via SUBCUTANEOUS
  Filled 2017-09-28 (×3): qty 0.4

## 2017-09-28 MED ORDER — PROPOFOL 10 MG/ML IV BOLUS
INTRAVENOUS | Status: DC | PRN
  Administered 2017-09-28: 80 mg via INTRAVENOUS

## 2017-09-28 SURGICAL SUPPLY — 49 items
APPLIER CLIP 11 MED OPEN (CLIP)
APPLIER CLIP 13 LRG OPEN (CLIP)
BAG BILE T-TUBES STRL (MISCELLANEOUS) IMPLANT
BARRIER SKIN 2 3/4 (OSTOMY) IMPLANT
BARRIER SKIN OD2.25 2 3/4 FLNG (OSTOMY) IMPLANT
CHLORAPREP W/TINT 26ML (MISCELLANEOUS) ×2 IMPLANT
CLAMP POUCH DRAINAGE QUIET (OSTOMY) IMPLANT
CLIP APPLIE 11 MED OPEN (CLIP) IMPLANT
CLIP APPLIE 13 LRG OPEN (CLIP) IMPLANT
DRAPE LAPAROTOMY 100X77 ABD (DRAPES) ×2 IMPLANT
DRSG OPSITE POSTOP 4X10 (GAUZE/BANDAGES/DRESSINGS) ×1 IMPLANT
ELECT BLADE 6 FLAT ULTRCLN (ELECTRODE) ×2 IMPLANT
ELECT REM PT RETURN 9FT ADLT (ELECTROSURGICAL) ×2
ELECTRODE REM PT RTRN 9FT ADLT (ELECTROSURGICAL) ×1 IMPLANT
GAUZE SPONGE 4X4 12PLY STRL (GAUZE/BANDAGES/DRESSINGS) ×2 IMPLANT
GLOVE BIOGEL PI IND STRL 7.5 (GLOVE) ×1 IMPLANT
GLOVE BIOGEL PI INDICATOR 7.5 (GLOVE) ×1
GLOVE ECLIPSE 7.0 STRL STRAW (GLOVE) ×2 IMPLANT
GOWN STRL REUS W/TWL LRG LVL3 (GOWN DISPOSABLE) ×6 IMPLANT
HANDLE SUCTION POOLE (INSTRUMENTS) ×1 IMPLANT
KIT TURNOVER KIT A (KITS) ×2 IMPLANT
NEEDLE HYPO 22GX1.5 SAFETY (NEEDLE) ×2 IMPLANT
NS IRRIG 1000ML POUR BTL (IV SOLUTION) ×2 IMPLANT
PACK BASIN MAJOR ARMC (MISCELLANEOUS) ×2 IMPLANT
PACK COLON CLEAN CLOSURE (MISCELLANEOUS) ×1 IMPLANT
RELOAD LINEAR CUT PROX 55 BLUE (ENDOMECHANICALS) IMPLANT
RELOAD PROXIMATE 75MM BLUE (ENDOMECHANICALS) IMPLANT
RELOAD STAPLE 55 3.8 BLU REG (ENDOMECHANICALS) IMPLANT
RELOAD STAPLE 75 3.8 BLU REG (ENDOMECHANICALS) IMPLANT
RETRACTOR WND ALEXIS-O 25 LRG (MISCELLANEOUS) IMPLANT
RETRACTOR WOUND ALXS 18CM MED (MISCELLANEOUS) IMPLANT
RTRCTR WOUND ALEXIS O 18CM MED (MISCELLANEOUS)
RTRCTR WOUND ALEXIS O 25CM LRG (MISCELLANEOUS) ×2
STAPLER GUN LINEAR PROX 60 (STAPLE) IMPLANT
STAPLER PROXIMATE 55 BLUE (STAPLE) IMPLANT
STAPLER PROXIMATE 75MM BLUE (STAPLE) IMPLANT
STAPLER SKIN PROX 35W (STAPLE) ×1 IMPLANT
SUCTION POOLE HANDLE (INSTRUMENTS) ×2
SUT CHROMIC 0 SH (SUTURE) IMPLANT
SUT CHROMIC 2 0 SH (SUTURE) IMPLANT
SUT PDS #1 CTX NDL (SUTURE) ×2 IMPLANT
SUT PDS AB 0 CT1 27 (SUTURE) ×4 IMPLANT
SUT SILK 2 0 (SUTURE) ×1
SUT SILK 2-0 18XBRD TIE 12 (SUTURE) ×1 IMPLANT
SUT SILK 3-0 (SUTURE) ×1
SUT SILK 3-0 SH-1 18XCR BRD (SUTURE) ×1
SUTURE SILK 3-0 SH-1 18XCR BRD (SUTURE) ×1 IMPLANT
SYR 20CC LL (SYRINGE) ×2 IMPLANT
TRAY FOLEY SLVR 16FR LF STAT (SET/KITS/TRAYS/PACK) ×2 IMPLANT

## 2017-09-28 NOTE — Anesthesia Preprocedure Evaluation (Signed)
Anesthesia Evaluation  Patient identified by MRN, date of birth, ID band Patient awake    Reviewed: Allergy & Precautions, H&P , NPO status , reviewed documented beta blocker date and time   Airway Mallampati: II  TM Distance: >3 FB Neck ROM: limited    Dental  (+) Caps, Partial Lower, Dental Advidsory Given   Pulmonary    Pulmonary exam normal        Cardiovascular Normal cardiovascular exam     Neuro/Psych Anxiety  Neuromuscular disease    GI/Hepatic hiatal hernia, GERD  Controlled,  Endo/Other  Hypothyroidism   Renal/GU      Musculoskeletal  (+) Arthritis ,   Abdominal   Peds  Hematology   Anesthesia Other Findings Past Medical History: No date: Anxiety No date: Arthritis     Comment:  finger!! 2000: Barrett's esophagus No date: GERD (gastroesophageal reflux disease) No date: History of hiatal hernia No date: Hypothyroidism No date: Intractable vomiting with nausea     Comment:  occurs with each obstructive episode. 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 11/13/2016: SBO (small bowel obstruction) (HCC) No date: Small bowel obstruction (HCC) No date: Thyroid disease Past Surgical History: 2002: APPENDECTOMY 2002: PARTIAL COLECTOMY     Comment:  large polyp No date: TONSILLECTOMY No date: TUBAL LIGATION BMI    Body Mass Index:  20.97 kg/m     Reproductive/Obstetrics                             Anesthesia Physical Anesthesia Plan  ASA: III  Anesthesia Plan: General ETT   Post-op Pain Management:    Induction:   PONV Risk Score and Plan: 4 or greater and Ondansetron, Treatment may vary due to age or medical condition, Dexamethasone, Metaclopromide and Midazolam  Airway Management Planned:   Additional Equipment:   Intra-op Plan:   Post-operative Plan:   Informed Consent: I have reviewed the  patients History and Physical, chart, labs and discussed the procedure including the risks, benefits and alternatives for the proposed anesthesia with the patient or authorized representative who has indicated his/her understanding and acceptance.   Dental Advisory Given  Plan Discussed with: CRNA  Anesthesia Plan Comments:         Anesthesia Quick Evaluation

## 2017-09-28 NOTE — Anesthesia Post-op Follow-up Note (Signed)
Anesthesia QCDR form completed.        

## 2017-09-28 NOTE — Transfer of Care (Signed)
Immediate Anesthesia Transfer of Care Note  Patient: Diana Owen  Procedure(s) Performed: EXPLORATORY LAPAROTOMY WITH LYSIS OF ADHESIONS (N/A Abdomen)  Patient Location: PACU  Anesthesia Type:General  Level of Consciousness: sedated  Airway & Oxygen Therapy: Patient Spontanous Breathing and Patient connected to face mask oxygen  Post-op Assessment: Report given to RN and Post -op Vital signs reviewed and stable  Post vital signs: Reviewed and stable  Last Vitals:  Vitals Value Taken Time  BP 136/74 09/28/2017  9:20 AM  Temp 36.3 C 09/28/2017  9:20 AM  Pulse 70 09/28/2017  9:20 AM  Resp 13 09/28/2017  9:20 AM  SpO2 100 % 09/28/2017  9:20 AM  Vitals shown include unvalidated device data.  Last Pain:  Vitals:   09/28/17 0622  TempSrc: Oral  PainSc: 0-No pain         Complications: No apparent anesthesia complications

## 2017-09-28 NOTE — Anesthesia Procedure Notes (Signed)
Procedure Name: Intubation Date/Time: 09/28/2017 7:44 AM Performed by: Hedda Slade, CRNA Pre-anesthesia Checklist: Patient identified, Patient being monitored, Timeout performed, Emergency Drugs available and Suction available Patient Re-evaluated:Patient Re-evaluated prior to induction Oxygen Delivery Method: Circle system utilized Preoxygenation: Pre-oxygenation with 100% oxygen Induction Type: IV induction Ventilation: Mask ventilation without difficulty Laryngoscope Size: Mac and 3 Grade View: Grade I Tube type: Oral Tube size: 7.0 mm Number of attempts: 1 Airway Equipment and Method: Stylet Placement Confirmation: ETT inserted through vocal cords under direct vision,  positive ETCO2 and breath sounds checked- equal and bilateral Secured at: 21 cm Tube secured with: Tape Dental Injury: Teeth and Oropharynx as per pre-operative assessment

## 2017-09-28 NOTE — Op Note (Signed)
SURGICAL OPERATIVE REPORT  DATE OF PROCEDURE: 09/28/2017  ATTENDING Surgeon(s): Ancil Linsey, MD  ASSISTANT(S): Hulda Marin, MD  ANESTHESIA: general   PRE-OPERATIVE DIAGNOSIS: Multiple short-interval recurrent small bowel obstructions (icd-10's: K56.52)  POST-OPERATIVE DIAGNOSIS: Multiple short-interval recurrent small bowel obstructions (icd-10's: K56.52)  PROCEDURE(S):  1.) Exploratory laparotomy with lysis of adhesions (cpt: 44005)  INTRAOPERATIVE FINDINGS: Multiple focal dense as well as filmy adhesions between loops of small intestine, small intestine and abdominal wall (Left and superior to umbilicus and LLQ), small intestine and Right/ascending colon, and small intestine and intestinal mesentery with likely incidental finding of both small and large jejunal diverticulosis x 4)  INTRAVENOUS FLUIDS: 800 mL crystalloid   ESTIMATED BLOOD LOSS: Minimal (<20 mL)  URINE OUTPUT: 350 mL   SPECIMENS: No Specimen  IMPLANTS: None  DRAINS: none  COMPLICATIONS: None apparent  CONDITION AT END OF PROCEDURE: Hemodynamically stable and extubated  DISPOSITION OF PATIENT: PACU  INDICATIONS FOR PROCEDURE:  Patient is an 83 year old Female who has been admitted x 4 for SBO over the past year alone, most recently nearly every 2 months for the past 6 months. Considering patient's multiple short-interval readmissions for SBO's attributed to post-surgical adhesions with possible internal hernias and overall good health despite her advanced age, elective laparotomy with lysis of adhesions was discussed at length and was offered. All risks, benefits, and alternatives to above procedure were discussed with the patient, all of patient's questions were answered to her expressed satisfaction, and informed consent was obtained and documented. Due to patient's multiple prior open abdominal surgeries with concern for diffuse dense fibrotic adhesions as well as the importance of efficiently  completing patient's potentially challenging surgery in context of her advanced age, Dr. Thelma Barge' assistance was requested and was accordingly greatly appreciated.  DETAILS OF PROCEDURE: Patient was brought to the operating suite and appropriately identified. General anesthesia was administered along with appropriate pre-operative antibiotics, and endotracheal intubation was performed by anesthetist. In supine position, operative site was prepped and draped in the usual sterile fashion, and following a brief time out, lower half of patient's former laparotomy incision was re-opened, though incision was extended to the Right of patient's umbilicus rather than to the Left as previously performed, both due to transition point laterality visualized on multiple recent CT imaging studies as well as to offer a site of peritoneal entry without scarring from prior open abdominal surgery if required. Incision was then extended deep through subcutaneous tissues and carefully through fascia, which was divided along linea alba. Peritoneum was likewise identified and carefully entered, taking care to avoid injury to underlying viscera.  Upon entering patient's abdominal cavity, noted were several focal filmy and dense adhesions between loops of small intestine, small intestine and abdominal wall (Left and superior to umbilicus and LLQ), small intestine and Right/ascending colon, and small intestine and intestinal mesenteric fat. Also noted was likely incidental visualized jejunal diverticulosis, both small and large x ~4). Diffuse dense intraperitoneal adhesions ("frozen abdomen") was fortunately, however, not encountered. Nearly all of the adhesions encountered created defects through which small bowel could certainly have herniated as suggested by prior CT imaging studies, and all such adhesive bands were sharply lysed.  The remainder of patient's abdominal cavity was then explored with no additional pathologic findings.  Exparel (72-hour release liposomal formulation of bupivicane) was injected into fascia and subcutaneously, and the abdominal wall was re-approximated in layers with #1 looped PDS sutures from the top and bottom of the incision. Intermittent buried interrupted 3-0  Vicryl dermal sutures were placed, and surgical skin staples were used to re-approximate skin. Skin was then cleaned and dried, and a sterile dressing was applied.  Patient was then safely able to be extubated, awakened, and transferred to PACU for post-operative monitoring and care.  I was present for all aspects of the above procedure, and no operative complications were apparent.

## 2017-09-28 NOTE — Interval H&P Note (Signed)
History and Physical Interval Note:  09/28/2017 7:21 AM  Diana Owen  has presented today for surgery, with the diagnosis of small bowel obstruction  The various methods of treatment have been discussed with the patient and family. After consideration of risks, benefits and other options for treatment, the patient has consented to  Procedure(s): EXPLORATORY LAPAROTOMY WITH LYSIS OF ADHESIONS (N/A) as a surgical intervention .  The patient's history has been reviewed, patient examined, no change in status, stable for surgery.  I have reviewed the patient's chart and labs.  Questions were answered to the patient's satisfaction.     [name]

## 2017-09-29 LAB — BASIC METABOLIC PANEL
Anion gap: 5 (ref 5–15)
BUN: 10 mg/dL (ref 8–23)
CHLORIDE: 107 mmol/L (ref 98–111)
CO2: 28 mmol/L (ref 22–32)
Calcium: 8.5 mg/dL — ABNORMAL LOW (ref 8.9–10.3)
Creatinine, Ser: 0.56 mg/dL (ref 0.44–1.00)
GFR calc Af Amer: >60 mL/min (ref >60)
GFR calc non Af Amer: >60 mL/min (ref >60)
Glucose, Bld: 117 mg/dL — ABNORMAL HIGH (ref 70–99)
POTASSIUM: 4.5 mmol/L (ref 3.5–5.1)
Sodium: 140 mmol/L (ref 135–145)

## 2017-09-29 LAB — GLUCOSE, CAPILLARY: GLUCOSE-CAPILLARY: 128 mg/dL — AB (ref 70–99)

## 2017-09-29 MED ORDER — LEVOTHYROXINE SODIUM 100 MCG PO TABS
100.0000 ug | ORAL_TABLET | ORAL | Status: DC
  Administered 2017-09-30 – 2017-10-01 (×2): 100 ug via ORAL
  Filled 2017-09-29 (×3): qty 1

## 2017-09-29 MED ORDER — ALBUTEROL SULFATE (2.5 MG/3ML) 0.083% IN NEBU: 2.5000 mg | INHALATION_SOLUTION | RESPIRATORY_TRACT | Status: DC | PRN

## 2017-09-29 MED ORDER — PANTOPRAZOLE SODIUM 40 MG PO TBEC
40.0000 mg | DELAYED_RELEASE_TABLET | Freq: Every day | ORAL | Status: DC
  Administered 2017-09-29 – 2017-10-01 (×3): 40 mg via ORAL
  Filled 2017-09-29 (×3): qty 1

## 2017-09-29 MED ORDER — BOOST / RESOURCE BREEZE PO LIQD CUSTOM
1.0000 | Freq: Three times a day (TID) | ORAL | Status: DC
  Administered 2017-09-29 (×3): 1 via ORAL

## 2017-09-29 MED ORDER — ALBUTEROL SULFATE HFA 108 (90 BASE) MCG/ACT IN AERS: 2.0000 | INHALATION_SPRAY | RESPIRATORY_TRACT | Status: DC | PRN

## 2017-09-29 NOTE — Progress Notes (Signed)
SURGICAL PROGRESS NOTE  Hospital Day(s): 1.   Post op day(s): 1 Day Post-Op.   Interval History: Patient seen and examined, no acute events or new complaints overnight. Patient reports mild peri-incisional pain well-controlled with medication x1 and +abdominal "rumbling", denies any nausea, flatus, fever/chills, CP, or SOB. Very little (<100 mL) has drained from NG tube.  Review of Systems:  Constitutional: denies fever, chills  Respiratory: denies any shortness of breath  Cardiovascular: denies chest pain or palpitations  Gastrointestinal: abdominal pain, N/V, and bowel function as per interval history Musculoskeletal: denies pain, decreased motor or sensation Integumentary: denies any other rashes or skin discolorations except post-surgical abdominal wound  Vital signs in last 24 hours: [min-max] current  Temp:  [97.3 F (36.3 C)-98.7 F (37.1 C)] 98.4 F (36.9 C) (07/16 0437) Pulse Rate:  [60-71] 65 (07/16 0437) Resp:  [12-20] 20 (07/16 0437) BP: (98-136)/(58-74) 114/62 (07/16 0437) SpO2:  [93 %-100 %] 94 % (07/16 0437)     Height: 5\' 1"  (154.9 cm) Weight: 111 lb (50.3 kg) BMI (Calculated): 20.98   Intake/Output this shift:  No intake/output data recorded.   Intake/Output last 2 shifts:  @IOLAST2SHIFTS @   Physical Exam:  Constitutional: alert, cooperative and no distress  Respiratory: breathing non-labored at rest  Cardiovascular: regular rate and sinus rhythm  Gastrointestinal: soft, non-tender, and non-distended  Labs:  CBC Latest Ref Rng & Units 08/27/2017 08/26/2017 08/25/2017  WBC 3.6 - 11.0 K/uL 10.7 14.5(H) 10.1  Hemoglobin 12.0 - 16.0 g/dL 45.413.3 09.814.1 11.914.0  Hematocrit 35.0 - 47.0 % 38.8 41.0 39.9  Platelets 150 - 440 K/uL 187 190 190   CMP Latest Ref Rng & Units 09/29/2017 08/27/2017 08/26/2017  Glucose 70 - 99 mg/dL 147(W117(H) 295(A144(H) 213(Y193(H)  BUN 8 - 23 mg/dL 10 11 20   Creatinine 0.44 - 1.00 mg/dL 8.650.56 7.840.55 6.960.74  Sodium 135 - 145 mmol/L 140 137 144  Potassium 3.5  - 5.1 mmol/L 4.5 3.5 3.7  Chloride 98 - 111 mmol/L 107 105 105  CO2 22 - 32 mmol/L 28 28 30   Calcium 8.9 - 10.3 mg/dL 2.9(B8.5(L) 2.8(U8.4(L) 9.5  Total Protein 6.5 - 8.1 g/dL - - -  Total Bilirubin 0.3 - 1.2 mg/dL - - -  Alkaline Phos 38 - 126 U/L - - -  AST 15 - 41 U/L - - -  ALT 14 - 54 U/L - - -   Assessment/Plan: 83 y.o. female doing quite well 1 Day Post-Op s/p exploratory laparotomy with lysis of adhesions for multiply recurrent short-interval small bowel obstructions, complicated by pertinent comorbidities including thyroid disease (not otherwise specified), and GERD.   - NG tube removed  - clear liquids diet + supplements  - pain control prn (minimize narcotics)   - medical management of comorbidities  - ambulation encouraged   - DVT prophylaxis   All of the above findings and recommendations were discussed with the patient, and all of patient's questions were answered to her expressed satisfaction.  -- Scherrie GerlachJason E. Earlene Plateravis, MD, RPVI [place]:  [facility] - Partnering for exceptional care. Office: [phone]

## 2017-09-30 LAB — BASIC METABOLIC PANEL
ANION GAP: 5 (ref 5–15)
BUN: 7 mg/dL — ABNORMAL LOW (ref 8–23)
CHLORIDE: 105 mmol/L (ref 98–111)
CO2: 27 mmol/L (ref 22–32)
Calcium: 8.3 mg/dL — ABNORMAL LOW (ref 8.9–10.3)
Creatinine, Ser: 0.51 mg/dL (ref 0.44–1.00)
GFR calc Af Amer: >60 mL/min (ref >60)
Glucose, Bld: 119 mg/dL — ABNORMAL HIGH (ref 70–99)
POTASSIUM: 3.7 mmol/L (ref 3.5–5.1)
Sodium: 137 mmol/L (ref 135–145)

## 2017-09-30 LAB — CBC
HEMATOCRIT: 36.7 % (ref 35.0–47.0)
HEMOGLOBIN: 12.6 g/dL (ref 12.0–16.0)
MCH: 31 pg (ref 26.0–34.0)
MCHC: 34.4 g/dL (ref 32.0–36.0)
MCV: 90.2 fL (ref 80.0–100.0)
Platelets: 169 10*3/uL (ref 150–440)
RBC: 4.07 MIL/uL (ref 3.80–5.20)
RDW: 13.7 % (ref 11.5–14.5)
WBC: 8.9 10*3/uL (ref 3.6–11.0)

## 2017-09-30 MED ORDER — ENSURE ENLIVE PO LIQD
237.0000 mL | Freq: Two times a day (BID) | ORAL | Status: DC
  Administered 2017-09-30 – 2017-10-01 (×3): 237 mL via ORAL

## 2017-09-30 NOTE — Anesthesia Postprocedure Evaluation (Signed)
Anesthesia Post Note  Patient: Mayra ReelMarie Boxx  Procedure(s) Performed: EXPLORATORY LAPAROTOMY WITH LYSIS OF ADHESIONS (N/A Abdomen)  Patient location during evaluation: PACU Anesthesia Type: General Level of consciousness: awake and alert Pain management: pain level controlled Vital Signs Assessment: post-procedure vital signs reviewed and stable Respiratory status: spontaneous breathing, nonlabored ventilation, respiratory function stable and patient connected to nasal cannula oxygen Cardiovascular status: blood pressure returned to baseline and stable Postop Assessment: no apparent nausea or vomiting Anesthetic complications: no     Last Vitals:  Vitals:   09/29/17 2039 09/30/17 0453  BP: 120/67 116/67  Pulse: 64 70  Resp: 18 18  Temp: 36.7 C 36.8 C  SpO2: 92% 94%    Last Pain:  Vitals:   09/30/17 1201  TempSrc:   PainSc: 2                  Christia ReadingScott T [place]

## 2017-09-30 NOTE — Progress Notes (Signed)
SURGICAL PROGRESS NOTE  Hospital Day(s): 2.   Post op day(s): 2 Days Post-Op.   Interval History: Patient seen and examined, no acute events or new complaints overnight. Patient reports mild peri-incisional pain controlled with medication early this morning. She also reports a few episodes of +flatus and ambulated x1 yesterday, denies N/V, fever/chills, CP, or SOB.  Review of Systems:  Constitutional: denies fever, chills  Respiratory: denies any shortness of breath  Cardiovascular: denies chest pain or palpitations  Gastrointestinal: abdominal pain, N/V, and bowel function as per interval history Musculoskeletal: denies pain, decreased motor or sensation Integumentary: denies any other rashes or skin discolorations except post-surgical abdominal wounds  Vital signs in last 24 hours: [min-max] current  Temp:  [97.6 F (36.4 C)-98.2 F (36.8 C)] 98.2 F (36.8 C) (07/17 0453) Pulse Rate:  [64-70] 70 (07/17 0453) Resp:  [16-18] 18 (07/17 0453) BP: (106-120)/(62-67) 116/67 (07/17 0453) SpO2:  [92 %-95 %] 94 % (07/17 0453)     Height: 5\' 1"  (154.9 cm) Weight: 111 lb (50.3 kg) BMI (Calculated): 20.98   Intake/Output this shift:  No intake/output data recorded.   Intake/Output last 2 shifts:  @IOLAST2SHIFTS @   Physical Exam:  Constitutional: alert, cooperative and no distress  Respiratory: breathing non-labored at rest  Cardiovascular: regular rate and sinus rhythm  Gastrointestinal: soft and non-distended with mild peri-incisional tenderness to palpation, incisions well-approximated without any peri-incisional erythema or drainage  Labs:  CBC Latest Ref Rng & Units 08/27/2017 08/26/2017 08/25/2017  WBC 3.6 - 11.0 K/uL 10.7 14.5(H) 10.1  Hemoglobin 12.0 - 16.0 g/dL 29.513.3 28.414.1 13.214.0  Hematocrit 35.0 - 47.0 % 38.8 41.0 39.9  Platelets 150 - 440 K/uL 187 190 190   CMP Latest Ref Rng & Units 09/30/2017 09/29/2017 08/27/2017  Glucose 70 - 99 mg/dL 440(N119(H) 027(O117(H) 536(U144(H)  BUN 8 - 23 mg/dL  7(L) 10 11  Creatinine 0.44 - 1.00 mg/dL 4.400.51 3.470.56 4.250.55  Sodium 135 - 145 mmol/L 137 140 137  Potassium 3.5 - 5.1 mmol/L 3.7 4.5 3.5  Chloride 98 - 111 mmol/L 105 107 105  CO2 22 - 32 mmol/L 27 28 28   Calcium 8.9 - 10.3 mg/dL 8.3(L) 8.5(L) 8.4(L)  Total Protein 6.5 - 8.1 g/dL - - -  Total Bilirubin 0.3 - 1.2 mg/dL - - -  Alkaline Phos 38 - 126 U/L - - -  AST 15 - 41 U/L - - -  ALT 14 - 54 U/L - - -   Assessment/Plan: 83 y.o. female doing quite well 1 Day Post-Op s/p exploratory laparotomy with lysis of adhesions for multiply recurrent short-interval small bowel obstructions, complicated by pertinent comorbidities including thyroid disease (not otherwise specified), and GERD.              - advanced to full liquids             - switched to Ensure supplements  - possible soft diet later today (afternoon)             - pain control prn (minimize narcotics)              - DVT prophylaxis, ambulation encouraged             - medical management of comorbidities  - discharge planning  All of the above findings and recommendations were discussed with the patient, and all of patient's questions were answered to her expressed satisfaction.  -- Scherrie GerlachJason E. Earlene Plateravis, MD, RPVI [place]: [facility] -  Partnering for exceptional care. Office: [phone]

## 2017-10-01 ENCOUNTER — Telehealth: Payer: Self-pay

## 2017-10-01 MED ORDER — OXYCODONE-ACETAMINOPHEN 5-325 MG PO TABS: 1.0000 | ORAL_TABLET | Freq: Four times a day (QID) | ORAL | 0 refills | Status: DC | PRN

## 2017-10-01 NOTE — Telephone Encounter (Signed)
Patients daughter called to to say that when discharged today she was instructed that Tylenol and Ibuprofen could be taken together or alternated between the two. She is calling to confirm that she understood.   She stated they had not filled the prescription of Oxycodone as of yet as they wanted to see how the above medications worked. We discussed that if she fills the Narcotics then do not use additional Tylenol but can still use Ibuprofen if needed.

## 2017-10-01 NOTE — Care Management Important Message (Signed)
Copy of signed IM left with patient in room.  

## 2017-10-01 NOTE — Discharge Instructions (Signed)
In addition to included general post-operative instructions for Laparotomy with lysis of adhesions,  Diet: Resume home heart healthy diet (let your body guide you, but keep hydrated).   Activity: No heavy lifting >15 - 20 pounds (children, pets, laundry, garbage) or strenuous activity until follow-up, but light activity and walking are encouraged. Do not drive or drink alcohol if taking narcotic pain medications.  Wound care: You may shower/get incision wet with soapy water and pat dry (do not rub incisions), but no baths or submerging incision underwater until follow-up.   Medications: Resume all home medications. For mild to moderate pain: acetaminophen (Tylenol) or ibuprofen/naproxen (if no kidney disease). Combining Tylenol with alcohol can substantially increase your risk of causing liver disease. Narcotic pain medications, if prescribed, can be used for severe pain, though may cause nausea, constipation, and drowsiness. Do not combine Tylenol and Percocet (or similar) within a 6 hour period as Percocet (and similar) contain(s) Tylenol. If you do not need the narcotic pain medication, you do not need to fill the prescription.  Call office [phone]) at any time if any questions, worsening pain, fevers/chills, bleeding, drainage from incision site, or other concerns.

## 2017-10-01 NOTE — Progress Notes (Signed)
Pt discharged per MD order. IVs removed. Discharge instructions reviewed with pt. Prescription given to pt. Pt verbalized understanding with all questions answered to pt satisfaction. Pt taken to car in wheelchair by volunteer.

## 2017-10-01 NOTE — Care Management (Signed)
Patient to discharge home today.  Per staff patent has ambulated and tolerating diet.  No dressing changes indicated.  No needs identified at this time.  Please consult if indicated.

## 2017-10-04 NOTE — Discharge Summary (Signed)
Physician Discharge Summary  Patient ID: Diana ReelMarie Mefferd MRN: [id number] DOB/AGE: [birth year] 81 y.o.  Admit date: 09/28/2017 Discharge date: 10/01/2017  Admission Diagnoses:  Discharge Diagnoses:  Active Problems:   S/P exploratory laparotomy   Discharged Condition: good  Hospital Course: 83 y.o. female [facility] for elective laparotomy with lysis of adhesions to treat multiply short-interval recurrent SBO's attributable to post-surgical intra-abdominal adhesions. Informed consent was obtained and documented, and patient underwent uneventful laparotomy with lysis of adhesions Earlene Plater(Davis, 09/28/2017).  Post-operatively, patient's improved/resolved and advancement of patient's diet and ambulation were well-tolerated. The remainder of patient's hospital course was essentially unremarkable, and discharge planning was initiated accordingly with patient safely able to be discharged home with appropriate discharge instructions, pain control, and outpatient surgical follow-up after all of her and family's questions were answered to their expressed satisfaction.  Consults: None  Treatments: surgery: Laparotomy with planned therapeutic lysis of adhesions Earlene Plater(Davis, 09/28/2017)  Discharge Exam: Blood pressure 118/66, pulse 69, temperature 98.5 F (36.9 C), temperature source Oral, resp. rate 18, height 5\' 1"  (1.549 m), weight 111 lb (50.3 kg), SpO2 94 %. General appearance: alert, cooperative and no distress GI: abdomen soft and non-distended with minimal peri-incisional tenderness to palpation, incision well-approximated without any surrounding erythema or drainage  Disposition:    Allergies as of 10/01/2017      Reactions   Codeine Nausea And Vomiting   Adhesive [tape] Rash   Paper tape is okay   Latex Rash   Probably adhesive allergy.  bandaids bother patient      Medication List    TAKE these medications   albuterol 108 (90 Base) MCG/ACT inhaler Commonly known as:  PROVENTIL  HFA;VENTOLIN HFA Inhale 2 puffs into the lungs every 4 (four) hours as needed for wheezing or shortness of breath.   cholecalciferol 1000 units tablet Commonly known as:  VITAMIN D Take 1,000 Units by mouth daily.   levothyroxine 100 MCG tablet Commonly known as:  SYNTHROID, LEVOTHROID Take 1 tablet (100 mcg total) by mouth daily.   multivitamin with minerals tablet Take 1 tablet by mouth daily.   omeprazole 20 MG capsule Commonly known as:  PRILOSEC Take 1 capsule (20 mg total) by mouth daily.   oxyCODONE-acetaminophen 5-325 MG tablet Commonly known as:  PERCOCET/ROXICET Take 1 tablet by mouth every 6 (six) hours as needed for severe pain.   polyethylene glycol packet Commonly known as:  MIRALAX / GLYCOLAX Take 17 g by mouth daily.   SF 5000 PLUS 1.1 % Crea dental cream Generic drug:  sodium fluoride APPLY A PEA-SIZED AMOUNT TO TOOTHBRUSH IN PLACE OF NORMAL PASTE; ...  (REFER TO PRESCRIPTION NOTES).   Vitamin B-12 6000 MCG Subl Place 1 tablet under the tongue daily.   vitamin E 100 UNIT capsule Take 100 Units by mouth daily.      Follow-up Information    Ancil Linseyavis, Jason Evan, MD. Go on 10/13/2017.   Specialty:  General Surgery Why:  Tuesday July30th at 2pm for a follow-up appoinment Contact information: [address] [place] [address] [phone]           Signed: Ancil LinseyJason Evan Davis 10/04/2017, 11:22 AM

## 2017-10-05 ENCOUNTER — Telehealth: Payer: Self-pay | Admitting: Surgery

## 2017-10-05 NOTE — Telephone Encounter (Signed)
Patients calling said she has broke up with a rash, light pink rash and is not right on th wound, said it hasn't gotten worse, but is just questioning it and asking if she can take something for it. Please call patient and advise.

## 2017-10-05 NOTE — Telephone Encounter (Signed)
Patient states she has rash on each side of abdomen. She tried using a different soap. She wanted to know if she could take Benadryl. She was told yes and also use benadryl cream on the rash as well.   She was instructed to call office if no better.  Patient stated she has only used Tylenol and ibuprofen for the mild discomfort and that she did not use the narcotic.

## 2017-10-05 NOTE — Telephone Encounter (Signed)
Left message for patient to return call.

## 2017-10-06 ENCOUNTER — Telehealth: Payer: Self-pay

## 2017-10-06 NOTE — Telephone Encounter (Signed)
EMMI Follow-up: Returned a call to Mrs. Rosal as she left a message saying she had a received a call from my number. I talked with Mrs. Masters and I explained the reason for the automated calls and was just returning her call to see if she was doing all right.  She said she had a rash come up but talked with a RN yesterday and she took some allergy relief medication and it seems to be better today. She had to reschedule her follow-up appointment to Thursday, Aug. 1st as her [facility].  Other than that no other needs noted.

## 2017-10-06 NOTE — Telephone Encounter (Signed)
See previous note

## 2017-10-12 ENCOUNTER — Telehealth: Payer: Self-pay

## 2017-10-12 NOTE — Telephone Encounter (Signed)
Left detailed message for patient (okay per DPR) to please call back with an update on her condition and if she has any questions or concerns.

## 2017-10-13 ENCOUNTER — Encounter: Payer: Medicare Other | Admitting: Surgery

## 2017-10-15 ENCOUNTER — Encounter: Payer: Self-pay | Admitting: Surgery

## 2017-10-15 ENCOUNTER — Ambulatory Visit (INDEPENDENT_AMBULATORY_CARE_PROVIDER_SITE_OTHER): Payer: Medicare Other | Admitting: Surgery

## 2017-10-15 VITALS — BP 115/69 | HR 68 | Temp 98.1°F | Ht 61.0 in | Wt 111.4 lb

## 2017-10-15 DIAGNOSIS — Z9889 Other specified postprocedural states: Secondary | ICD-10-CM

## 2017-10-15 DIAGNOSIS — Z4889 Encounter for other specified surgical aftercare: Secondary | ICD-10-CM | POA: Diagnosis not present

## 2017-10-15 NOTE — Progress Notes (Signed)
Surgical Clinic Progress/Follow-up Note   HPI:  83 y.o. Female presents to clinic for post-op follow-up just over 2 weeks s/p elective exploratory laparotomy with lysis of post-surgical adhesions Earlene Plater(Davis, 09/28/2017). Patient reports complete resolution of peri-operative pain and has been tolerating regular diet with +flatus and normal BM's, denies N/V, fever/chills, CP, or SOB. She further states she feels "very well" and expresses she is eager/excited to not be readmitted to the hospital with NG tube for bowel obstructions every 2 months.  Review of Systems:  Constitutional: denies fever/chills  Respiratory: denies shortness of breath, wheezing  Cardiovascular: denies chest pain, palpitations  Gastrointestinal: abdominal pain, N/V, and bowel function as per interval history Skin: Denies any other rashes or skin discolorations except post-surgical wounds as per interval history  Vital Signs:  BP 115/69   Pulse 68   Temp 98.1 F (36.7 C) (Oral)   Ht 5\' 1"  (1.549 m)   Wt 111 lb 6.4 oz (50.5 kg)   BMI 21.05 kg/m    Physical Exam:  Constitutional:  -- Normal body habitus  -- Awake, alert, and oriented x3  Pulmonary:  -- No crackles -- Equal breath sounds bilaterally -- Breathing non-labored at rest Cardiovascular:  -- S1, S2 present  -- No pericardial rubs  Gastrointestinal:  -- Soft and completely non-distended, non-tender to palpation, no guarding/rebound tenderness -- Post-surgical incisions all well-approximated without any peri-incisional erythema or drainage -- No abdominal masses appreciated, pulsatile or otherwise  Musculoskeletal / Integumentary:  -- Wounds or skin discoloration: None appreciated except post-surgical incisions as described above (GI) -- Extremities: B/L UE and LE FROM, hands and feet warm, no edema   Assessment:  83 y.o. yo Female with a problem list including...  Patient Active Problem List   Diagnosis Date Noted  . Cervical strain 07/24/2017  .  Malnutrition of mild degree (HCC) 11/21/2016  . Other idiopathic scoliosis, thoracolumbar region 07/16/2016  . Esophageal dysphagia 09/26/2015  . Acid reflux 09/04/2015  . Hyperlipidemia 09/04/2015  . 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  Plan:              - advance diet as tolerated  - all post-surgical skin staples removed, steri-strips applied             - okay to submerge incisions under water (baths, swimming) prn             - gradually resume all activities without restrictions over next 2 weeks (no heavy lifting >40 lbs x 4 more weeks, but patient denies any such activities)             - apply sunblock particularly to incisions with sun exposure to reduce pigmentation of scars             - return to clinic as needed, instructed to call office if any questions or concerns  All of the above recommendations were discussed with the patient, and all of patient's questions were answered to her expressed satisfaction.  -- Scherrie GerlachJason E. Earlene Plateravis, MD, RPVI [facility] - Partnering for exceptional care. Office: [phone]

## 2017-10-15 NOTE — Patient Instructions (Signed)
Please call our office if you have questions or concerns.   

## 2017-10-22 MED ORDER — MECLIZINE HCL 25 MG PO TABS: 25.0000 mg | ORAL_TABLET | Freq: Three times a day (TID) | ORAL | 1 refills | Status: DC | PRN

## 2017-10-22 NOTE — Telephone Encounter (Signed)
Rx sent electronically. Left message on VM per DPR that rx was sent

## 2017-10-22 NOTE — Telephone Encounter (Signed)
Patient states she is doing well after recent hospitalization. Has appt with Dr. Alphonsus SiasLetvak on 11/26/17.  She does report that she has been experiencing acute vertigo this morning.  Was making up her bed and turned too quickly and room started spinning. She did not fall or faint.  She has no other symptoms other than the room spinning dizziness.  She has increased her fluid intake this am and is getting ready to eat breakfast.  She has history of vertigo and wants to know if Dr. Alphonsus SiasLetvak would send her in a new R/X for meclizine to help her with symptoms.  Her bottle is very old and outdated.  Please call her back and let her know [facility] [address]  Dr. Alphonsus SiasLetvak - please advise.

## 2017-10-22 NOTE — Telephone Encounter (Signed)
Go ahead and send Rx for meclizine 25mg  tid prn vertigo #90 x 1

## 2017-11-26 ENCOUNTER — Ambulatory Visit (INDEPENDENT_AMBULATORY_CARE_PROVIDER_SITE_OTHER): Payer: Medicare Other | Admitting: Internal Medicine

## 2017-11-26 ENCOUNTER — Encounter: Payer: Self-pay | Admitting: Internal Medicine

## 2017-11-26 VITALS — BP 114/68 | HR 68 | Temp 97.8°F | Ht 61.25 in | Wt 114.8 lb

## 2017-11-26 DIAGNOSIS — R1310 Dysphagia, unspecified: Secondary | ICD-10-CM

## 2017-11-26 DIAGNOSIS — Z23 Encounter for immunization: Secondary | ICD-10-CM | POA: Diagnosis not present

## 2017-11-26 DIAGNOSIS — E039 Hypothyroidism, unspecified: Secondary | ICD-10-CM | POA: Diagnosis not present

## 2017-11-26 DIAGNOSIS — E441 Mild protein-calorie malnutrition: Secondary | ICD-10-CM

## 2017-11-26 DIAGNOSIS — Z0000 Encounter for general adult medical examination without abnormal findings: Secondary | ICD-10-CM

## 2017-11-26 DIAGNOSIS — R42 Dizziness and giddiness: Secondary | ICD-10-CM | POA: Insufficient documentation

## 2017-11-26 DIAGNOSIS — Z7189 Other specified counseling: Secondary | ICD-10-CM | POA: Insufficient documentation

## 2017-11-26 DIAGNOSIS — R1319 Other dysphagia: Secondary | ICD-10-CM

## 2017-11-26 NOTE — Assessment & Plan Note (Signed)
I have personally reviewed the Medicare Annual Wellness questionnaire and have noted 1. The patient's medical and social history 2. Their use of alcohol, tobacco or illicit drugs 3. Their current medications and supplements 4. The patient's functional ability including ADL's, fall risks, home safety risks and hearing or visual             impairment. 5. Diet and physical activities 6. Evidence for depression or mood disorders  The patients weight, height, BMI and visual acuity have been recorded in the chart I have made referrals, counseling and provided education to the patient based review of the above and I have provided the pt with a written personalized care plan for preventive services.  I have provided you with a copy of your personalized plan for preventive services. Please take the time to review along with your updated medication list.  Will update flu and pneumovax booster Prefers to wait on Td---will come in if any injury No cancer screening due to age Discussed exercise--- resistance, etc

## 2017-11-26 NOTE — Assessment & Plan Note (Signed)
Recent spell but meclizine did help

## 2017-11-26 NOTE — Progress Notes (Signed)
Subjective:    Patient ID: Diana Owen, female    DOB: [birth year], 83 y.o.   MRN: [id number]  HPI Here with husband for Medicare wellness visit and follow up of chronic health conditions Reviewed form and advanced directives Reviewed other doctors No tobacco or alcohol Tries to stay active with gardening/housekeeping and walking Vision and hearing are fine Mood generally good--no anhedonia No falls Independent with instrumental ADLs No sig memory issues  Has had some vertigo Spinning even with her eyes closed Meclizine does help  2 months since abdominal surgery  Doing well Has gained back 3 pounds Appetite is fair Bowels are regular with miralax She does note a hard area under one of the incisions that she wants checked  Continues on the thyroid med Still on the omeprazole--this seems to help once she restarted  Current Outpatient Medications on File Prior to Visit  Medication Sig Dispense Refill  . cholecalciferol (VITAMIN D) 1000 units tablet Take 1,000 Units by mouth daily.    . Cyanocobalamin (VITAMIN B-12) 6000 MCG SUBL Place 1 tablet under the tongue daily.    Marland Kitchen levothyroxine (SYNTHROID, LEVOTHROID) 100 MCG tablet Take 1 tablet (100 mcg total) by mouth daily. 90 tablet 3  . meclizine (ANTIVERT) 25 MG tablet Take 1 tablet (25 mg total) by mouth 3 (three) times daily as needed for dizziness. 90 tablet 1  . Multiple Vitamins-Minerals (MULTIVITAMIN WITH MINERALS) tablet Take 1 tablet by mouth daily.    Marland Kitchen omeprazole (PRILOSEC) 20 MG capsule Take 1 capsule (20 mg total) by mouth daily. 30 capsule 11  . polyethylene glycol (MIRALAX / GLYCOLAX) packet Take 17 g by mouth daily.    . SF 5000 PLUS 1.1 % CREA dental cream APPLY A PEA-SIZED AMOUNT TO TOOTHBRUSH IN PLACE OF NORMAL PASTE; ...  (REFER TO PRESCRIPTION NOTES).  0  . vitamin E 100 UNIT capsule Take 100 Units by mouth daily.    Marland Kitchen albuterol (PROVENTIL HFA;VENTOLIN HFA) 108 (90 Base) MCG/ACT inhaler Inhale 2 puffs into  the lungs every 4 (four) hours as needed for wheezing or shortness of breath.      No current facility-administered medications on file prior to visit.     Allergies  Allergen Reactions  . Codeine Nausea And Vomiting  . Adhesive [Tape] Rash    Paper tape is okay  . Latex Rash    Probably adhesive allergy.  bandaids bother patient    Past Medical History:  Diagnosis Date  . Anxiety   . Arthritis    finger!!  . Barrett's esophagus 2000  . GERD (gastroesophageal reflux disease)   . History of hiatal hernia   . Hypothyroidism   . 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 Intractable vomiting with nausea    occurs with each obstructive episode. if patient gets up during meal to let food pass, then n & v is not so bad  . Midgut volvulus   . SBO (small bowel obstruction) (HCC) 11/13/2016  . Small bowel obstruction (HCC)   . Small bowel obstruction due to postoperative adhesions 06/29/2017  . Thyroid disease     Past Surgical History:  Procedure Laterality Date  . APPENDECTOMY  2002  . LAPAROTOMY N/A 09/28/2017   Procedure: EXPLORATORY LAPAROTOMY WITH LYSIS OF ADHESIONS;  Surgeon: Ancil Linsey, MD;  Location: [facility] ORS;  Service: General;  Laterality: N/A;  . PARTIAL COLECTOMY  2002   large polyp  . TONSILLECTOMY    . TUBAL LIGATION  Family History  Problem Relation Age of Onset  . Leukemia Mother        CLL  . Leukemia Father   . Hyperlipidemia Brother   . Stroke Brother   . Heart disease Paternal Uncle   . Diabetes Neg Hx     Social History   Socioeconomic History  . Marital status: Married    Spouse name: Not on file  . Number of children: 3  . Years of education: Not on file  . Highest education level: Not on file  Occupational History  . Occupation: Diplomatic Services operational officer  . 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 Financial resource strain: Not on file  . Food insecurity:    Worry: Not on file    Inability: Not  on file  . Transportation needs:    Medical: Not on file    Non-medical: Not on file  Tobacco Use  . Smoking status: Never Smoker  . Smokeless tobacco: Never Used  Substance and Sexual Activity  . Alcohol use: Yes    Comment: rare wine   . Drug use: No  . Sexual activity: Yes  Lifestyle  . Physical activity:    Days per week: Not on file    Minutes per session: Not on file  . Stress: Not on file  Relationships  . Social connections:    Talks on phone: Not on file    Gets together: Not on file    Attends religious service: Not on file    Active member of club or organization: Not on file    Attends meetings of clubs or organizations: Not on file    Relationship status: Not on file  . Intimate partner violence:    Fear of current or ex partner: Not on file    Emotionally abused: Not on file    Physically abused: Not on file    Forced sexual activity: Not on file  Other Topics Concern  . Not on file  Social History Narrative   2 sons, 1 daughter--- Brett Canales in [place] in [place], Selinda Orion in [place]      Has a living will   Daughter [name], then sons, should make health care decisions   Would accept resuscitation attempts   Not sure about tube feeds   Review of Systems Sleeps well Wears seat belt Lost partial dentures last year--- has affected her swallowing. Looking for new dentist now No regular heartburn. Generally swallows okay No skin lesions. Doesn't see derm Void fine. No dysuria or hematuria Has lost a lot of height--occasional back pain No other joint pains    Objective:   Physical Exam  Constitutional: She is oriented to person, place, and time. She appears well-developed. No distress.  HENT:  Mouth/Throat: Oropharynx is clear and moist. No oropharyngeal exudate.  Neck: Normal range of motion. No thyromegaly present.  Cardiovascular: Normal rate, regular rhythm, normal heart sounds and intact distal pulses. Exam reveals no gallop.  No murmur  heard. Respiratory: Effort normal and breath sounds normal. No respiratory distress. She has no wheezes. She has no rales.  GI: Soft. She exhibits no distension. There is no tenderness. There is no rebound and no guarding.  Some induration and sutures still palpable under abdominal incision  Musculoskeletal: She exhibits no edema or tenderness.  Lymphadenopathy:    She has no cervical adenopathy.  Neurological: She is alert and oriented to person, place, and time.  President--- "Lora Havens, Obama,  Bush" [id number] D-l-[id number]-w Recall 2/3           Assessment & Plan:

## 2017-11-26 NOTE — Progress Notes (Signed)
Hearing Screening   125Hz  250Hz  500Hz  1000Hz  2000Hz  3000Hz  4000Hz  6000Hz  8000Hz   Right ear:   25 25 25  25     Left ear:   25 25 25  25       Visual Acuity Screening   Right eye Left eye Both eyes  Without correction:     With correction: 20/25 20/25 20/25

## 2017-11-26 NOTE — Assessment & Plan Note (Signed)
Symptoms better back on daily PPI

## 2017-11-26 NOTE — Addendum Note (Signed)
Addended by: Desmond DikeKNIGHT, Rayshawn Visconti H on: 11/26/2017 03:30 PM   Modules accepted: Orders

## 2017-11-26 NOTE — Assessment & Plan Note (Signed)
See social history 

## 2017-11-26 NOTE — Assessment & Plan Note (Signed)
Seems to be euthyroid No changes needed

## 2017-11-26 NOTE — Assessment & Plan Note (Signed)
Has gained back 3#

## 2017-11-27 ENCOUNTER — Telehealth: Payer: Self-pay | Admitting: Internal Medicine

## 2017-11-27 NOTE — Telephone Encounter (Signed)
Let her know that I am sorry she had that reaction. It is likely from the pneumonia vaccine which she should never need again

## 2017-11-27 NOTE — Telephone Encounter (Signed)
Copied from CRM [id number]. Topic: Quick Communication - See Telephone Encounter >> Nov 27, 2017  1:13 PM Jens SomMedley, [name] wrote: CRM for notification. See Telephone encounter for: 11/27/17. Patient called because she had a flu and peunomia shot yesterday 11/26/17.  She felt terrible today.  She was experienced extremely weak, arm sore.  No fever. [facility] and she called the nurse.  The nurse advised her to drink plenty of liquids.  She wanted to Dr. Alphonsus SiasLetvak know.  But she feeling better now.

## 2017-11-27 NOTE — Telephone Encounter (Signed)
Spoke to pt. She is better.

## 2018-04-06 ENCOUNTER — Telehealth: Payer: Self-pay

## 2018-04-06 NOTE — Telephone Encounter (Signed)
[facility] Night - Client Nonclinical Telephone Record [facility] Medical Call Center Client [facility] Night - Client Client Site [facility] - Night Contact Type Call Who Is Calling Patient / Member / Family / Caregiver Caller Name Irais Aulbach Caller Phone Number [phone] Call Type Message Only Information Provided Reason for Call Returning a Call from the Office Initial Comment Mrs Senechal is returning a call to the office that she just missed. Additional Comment Call Closed By: Eldridge Abrahams Transaction Date/Time: 04/05/2018 5:34:49 PM (ET)

## 2018-04-06 NOTE — Telephone Encounter (Signed)
Her husband has an appointment coming up with Dr Alphonsus Sias for [facility] 04-08-18. It was probably a reminder call.

## 2018-06-03 ENCOUNTER — Ambulatory Visit: Payer: Self-pay

## 2018-06-03 NOTE — Telephone Encounter (Signed)
Patient called and says she's been coughing since Saturday. She says at first she was coughing a little up and it was yellow, but today it's clear. She says the sputum is hard to get up, it sticks to the back of her throat. She says she was having sinus pain over the weekend and she used a warm towel to help. I asked about a fever, she was not able to check her temperature, but says she doesn't feel like she has a fever. I asked about SOB, she says that she's tired, but doesn't think it's SOB. She says it's hard to tell with her larger abdomen if it's in her chest or her stomach, heaviness feeling after eating today. I asked about other symptoms, she says just sinus drainage to the back of her throat. She denies travel or exposure to anyone traveling. According to protocol, see PCP within 24 hours, appointment scheduled for tomorrow, 06/04/18 at 1030 with Dr. Alphonsus Sias, care advice given, patient verbalized understanding.  Reason for Disposition . [1] Using nasal washes and pain medicine > 24 hours AND [2] sinus pain (around cheekbone or eye) persists  Answer Assessment - Initial Assessment Questions 1. ONSET: "When did the cough begin?"      Saturday 2. SEVERITY: "How bad is the cough today?"      Not bad today, last night I coughed a lot 3. RESPIRATORY DISTRESS: "Describe your breathing."      It's hard to tell 4. FEVER: "Do you have a fever?" If so, ask: "What is your temperature, how was it measured, and when did it start?"     I don't feel warm 5. SPUTUM: "Describe the color of your sputum" (clear, white, yellow, green)     Yellow, but clear, thick today 6. HEMOPTYSIS: "Are you coughing up any blood?" If so ask: "How much?" (flecks, streaks, tablespoons, etc.)     No 7. CARDIAC HISTORY: "Do you have any history of heart disease?" (e.g., heart attack, congestive heart failure)      No 8. LUNG HISTORY: "Do you have any history of lung disease?"  (e.g., pulmonary embolus, asthma, emphysema)      No 9. PE RISK FACTORS: "Do you have a history of blood clots?" (or: recent major surgery, recent prolonged travel, bedridden)     No 10. OTHER SYMPTOMS: "Do you have any other symptoms?" (e.g., runny nose, wheezing, chest pain)      Sinus drainage to back of throat 11. PREGNANCY: "Is there any chance you are pregnant?" "When was your last menstrual period?"       No 12. TRAVEL: "Have you traveled out of the country in the last month?" (e.g., travel history, exposures)       No  Protocols used: COUGH - ACUTE PRODUCTIVE-A-AH

## 2018-06-04 ENCOUNTER — Ambulatory Visit: Payer: Medicare Other | Admitting: Internal Medicine

## 2018-06-04 ENCOUNTER — Telehealth: Payer: Self-pay

## 2018-06-04 NOTE — Telephone Encounter (Signed)
[facility] Night - Client Nonclinical Telephone Record [facility] Medical Call Center Client [facility] Night - Client Client Site [facility] - Night Contact Type Call Who Is Calling Patient / Member / Family / Caregiver Caller Name Nefertiti Lippard Caller Phone Number [phone] Call Type Message Only Information Provided Reason for Call Returning a Call from the Office Initial Comment Caller is returning a call to the office in regards to her appointment. Additional Comment Provided Office Hours. Call Closed By: Brooke Pace Transaction Date/Time: 06/04/2018 8:01:48 AM (ET)

## 2018-06-04 NOTE — Telephone Encounter (Signed)
Already spoke to pt. Moved her appt to Monday afternoon.

## 2018-06-07 ENCOUNTER — Ambulatory Visit: Payer: Medicare Other | Admitting: Internal Medicine

## 2018-07-08 ENCOUNTER — Other Ambulatory Visit: Payer: Self-pay | Admitting: Internal Medicine

## 2018-09-28 ENCOUNTER — Other Ambulatory Visit: Payer: Self-pay | Admitting: Internal Medicine

## 2018-10-25 ENCOUNTER — Encounter: Payer: Self-pay | Admitting: Internal Medicine

## 2018-10-25 ENCOUNTER — Ambulatory Visit (INDEPENDENT_AMBULATORY_CARE_PROVIDER_SITE_OTHER): Payer: Medicare Other | Admitting: Internal Medicine

## 2018-10-25 ENCOUNTER — Other Ambulatory Visit: Payer: Self-pay

## 2018-10-25 ENCOUNTER — Ambulatory Visit (INDEPENDENT_AMBULATORY_CARE_PROVIDER_SITE_OTHER)
Admission: RE | Admit: 2018-10-25 | Discharge: 2018-10-25 | Disposition: A | Discharge status: Home / Self Care | Payer: Medicare Other | Source: Ambulatory Visit | Attending: Internal Medicine | Admitting: Internal Medicine

## 2018-10-25 DIAGNOSIS — M25552 Pain in left hip: Secondary | ICD-10-CM

## 2018-10-25 NOTE — Progress Notes (Signed)
Subjective:    Patient ID: Diana Owen, female    DOB: 18-Sep-1934, 84 y.o.   MRN: [id number]  HPI Here with husband with several concerns  Having leg pain for the past 5 months or so Points to posterior left hip Very stiff--hard getting up ----but then will loosen up Walking with stick (mop stick)--to help balance No known injury Tried OTC med (?ibuprofen)--upset stomach--but may have helped Had used acetaminophen in past--stopped for some reason  Current Outpatient Medications on File Prior to Visit  Medication Sig Dispense Refill  . cholecalciferol (VITAMIN D) 1000 units tablet Take 1,000 Units by mouth daily.    . Cyanocobalamin (VITAMIN B-12) 6000 MCG SUBL Place 1 tablet under the tongue daily.    Marland Kitchen. levothyroxine (SYNTHROID) 100 MCG tablet TAKE 1 TABLET(100 MCG) BY MOUTH DAILY 90 tablet 3  . meclizine (ANTIVERT) 25 MG tablet Take 1 tablet (25 mg total) by mouth 3 (three) times daily as needed for dizziness. 90 tablet 1  . Multiple Vitamins-Minerals (MULTIVITAMIN WITH MINERALS) tablet Take 1 tablet by mouth daily.    Marland Kitchen. omeprazole (PRILOSEC) 20 MG capsule TAKE 1 CAPSULE(20 MG) BY MOUTH DAILY 30 capsule 11  . polyethylene glycol (MIRALAX / GLYCOLAX) packet Take 17 g by mouth daily.    . SF 5000 PLUS 1.1 % CREA dental cream APPLY A PEA-SIZED AMOUNT TO TOOTHBRUSH IN PLACE OF NORMAL PASTE; ...  (REFER TO PRESCRIPTION NOTES).  0  . vitamin E 100 UNIT capsule Take 100 Units by mouth daily.    Marland Kitchen. albuterol (PROVENTIL HFA;VENTOLIN HFA) 108 (90 Base) MCG/ACT inhaler Inhale 2 puffs into the lungs every 4 (four) hours as needed for wheezing or shortness of breath.      No current facility-administered medications on file prior to visit.     Allergies  Allergen Reactions  . Codeine Nausea And Vomiting  . Adhesive [Tape] Rash    Paper tape is okay  . Latex Rash    Probably adhesive allergy.  bandaids bother patient    Past Medical History:  Diagnosis Date  . Anxiety   . Arthritis     finger!!  . Barrett's esophagus 2000  . GERD (gastroesophageal reflux disease)   . History of hiatal hernia   . Hypothyroidism   . 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 Intractable vomiting with nausea    occurs with each obstructive episode. if patient gets up during meal to let food pass, then n & v is not so bad  . Midgut volvulus   . SBO (small bowel obstruction) (HCC) 11/13/2016  . Small bowel obstruction (HCC)   . Small bowel obstruction due to postoperative adhesions 06/29/2017  . Thyroid disease     Past Surgical History:  Procedure Laterality Date  . APPENDECTOMY  2002  . LAPAROTOMY N/A 09/28/2017   Procedure: EXPLORATORY LAPAROTOMY WITH LYSIS OF ADHESIONS;  Surgeon: Ancil Linseyavis, Jason Evan, MD;  Location: [facility] ORS;  Service: General;  Laterality: N/A;  . PARTIAL COLECTOMY  2002   large polyp  . TONSILLECTOMY    . TUBAL LIGATION      Family History  Problem Relation Age of Onset  . Leukemia Mother        CLL  . Leukemia Father   . Hyperlipidemia Brother   . Stroke Brother   . Heart disease Paternal Uncle   . Diabetes Neg Hx     Social History   Socioeconomic History  . Marital status: Married  Spouse name: Not on file  . Number of children: 3  . Years of education: Not on file  . Highest education level: Not on file  Occupational History  . Occupation: Network engineer  . 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 Financial resource strain: Not on file  . Food insecurity    Worry: Not on file    Inability: Not on file  . Transportation needs    Medical: Not on file    Non-medical: Not on file  Tobacco Use  . Smoking status: Never Smoker  . Smokeless tobacco: Never Used  Substance and Sexual Activity  . Alcohol use: Yes    Comment: rare wine   . Drug use: No  . Sexual activity: Yes  Lifestyle  . Physical activity    Days per week: Not on file    Minutes per session: Not on file  . Stress: Not on file   Relationships  . Social Herbalist on phone: Not on file    Gets together: Not on file    Attends religious service: Not on file    Active member of club or organization: Not on file    Attends meetings of clubs or organizations: Not on file    Relationship status: Not on file  . Intimate partner violence    Fear of current or ex partner: Not on file    Emotionally abused: Not on file    Physically abused: Not on file    Forced sexual activity: Not on file  Other Topics Concern  . Not on file  Social History Narrative   2 sons, 1 daughter--- Richardson Landry in [place] in [place], Gardiner Rhyme in [place]      Has a living will   Daughter Bethena Roys, then sons, should make health care decisions   Would accept resuscitation attempts   Not sure about tube feeds   Review of Systems  Has had some trouble with her stomach Needs handicapped permit Still gets vertigo at times---still has some meclizine to use     Objective:   Physical Exam  Constitutional: She appears well-developed. No distress.  Musculoskeletal:     Comments: No clear tenderness in left hip Fairly normal external rotation (causes some pain) and relatively preserved internal rotation No bursa tenderness  Right hip ROM fairly normal  Neurological:  Antalgic gait favoring left           Assessment & Plan:

## 2018-10-25 NOTE — Assessment & Plan Note (Signed)
Doesn't appear to have bad arthritis---but will check x-ray May be more soft tissue--though bursa doesn't seem involved Will check x-ray If no findings---will refer for PT to [facility]

## 2018-11-18 DIAGNOSIS — S7292XD Unspecified fracture of left femur, subsequent encounter for closed fracture with routine healing: Secondary | ICD-10-CM | POA: Diagnosis not present

## 2018-11-23 DIAGNOSIS — S7292XD Unspecified fracture of left femur, subsequent encounter for closed fracture with routine healing: Secondary | ICD-10-CM | POA: Diagnosis not present

## 2018-11-25 DIAGNOSIS — S7292XD Unspecified fracture of left femur, subsequent encounter for closed fracture with routine healing: Secondary | ICD-10-CM | POA: Diagnosis not present

## 2018-11-29 DIAGNOSIS — S7292XD Unspecified fracture of left femur, subsequent encounter for closed fracture with routine healing: Secondary | ICD-10-CM | POA: Diagnosis not present

## 2018-12-02 DIAGNOSIS — S7292XD Unspecified fracture of left femur, subsequent encounter for closed fracture with routine healing: Secondary | ICD-10-CM | POA: Diagnosis not present

## 2018-12-06 ENCOUNTER — Telehealth: Payer: Self-pay

## 2018-12-06 DIAGNOSIS — S7292XD Unspecified fracture of left femur, subsequent encounter for closed fracture with routine healing: Secondary | ICD-10-CM | POA: Diagnosis not present

## 2018-12-06 NOTE — Telephone Encounter (Signed)
Good to hear that she is improving

## 2018-12-06 NOTE — Telephone Encounter (Signed)
Pt wanted to let Dr Silvio Pate know she fell 11-30-18 on the same hip where the fracture. She saw a nurse at [facility] and has been doing rehab at [facility]. She is feeling better now.

## 2018-12-08 ENCOUNTER — Other Ambulatory Visit: Payer: Self-pay

## 2018-12-09 ENCOUNTER — Encounter: Payer: Self-pay | Admitting: Internal Medicine

## 2018-12-09 ENCOUNTER — Ambulatory Visit (INDEPENDENT_AMBULATORY_CARE_PROVIDER_SITE_OTHER): Payer: Medicare Other | Admitting: Internal Medicine

## 2018-12-09 VITALS — BP 116/64 | HR 73 | Temp 97.5°F | Ht 60.25 in | Wt 119.2 lb

## 2018-12-09 DIAGNOSIS — E039 Hypothyroidism, unspecified: Secondary | ICD-10-CM | POA: Diagnosis not present

## 2018-12-09 DIAGNOSIS — Z7189 Other specified counseling: Secondary | ICD-10-CM

## 2018-12-09 DIAGNOSIS — K2100 Gastro-esophageal reflux disease with esophagitis, without bleeding: Secondary | ICD-10-CM

## 2018-12-09 DIAGNOSIS — F39 Unspecified mood [affective] disorder: Secondary | ICD-10-CM | POA: Diagnosis not present

## 2018-12-09 DIAGNOSIS — Z0000 Encounter for general adult medical examination without abnormal findings: Secondary | ICD-10-CM | POA: Diagnosis not present

## 2018-12-09 NOTE — Assessment & Plan Note (Signed)
Has had reactive anxiety (with husband's dementia) Not really depressed and certainly not MDD Had one panic attack only No Rx for now

## 2018-12-09 NOTE — Progress Notes (Signed)
Subjective:    Patient ID: Diana Owen, female    DOB: 1934/06/07, 84 y.o.   MRN: [id number]  HPI Here for Medicare wellness visit Reviewed form and advanced directives  Reviewed other doctors No tobacco Does drink a little wine nightly Does try to exercise regularly Did fall---may have had occult left hip fracture Vision and hearing are fine She has noticed some memory issues lately She does the instrumental ADLs  Biggest issue is care for her husband with dementia He will still use the bathroom but refuses showers He needs 24 hour supervision She just picks up food at [employer]---orders it ahead Has home health now --rehab for her She uses rollator now to walk--even in the house  Eating okay Needs soft food due to poor teeth Feels her weight is normal for her  Energy levels are okay Bowels are good with the miralax Hair does seem thinner  Takes the PPI daily No heartburn or dysphagia on this  She has had some mood issues Gets down in the evening--when husband gets more confused Will drink 1/2 glass of wine--that helps Has had 1 "panic" attack--but feels it may be indigestion related (happened trying to get husband ready to come for MD visit) Sleeps well  Current Outpatient Medications on File Prior to Visit  Medication Sig Dispense Refill  . cholecalciferol (VITAMIN D) 1000 units tablet Take 1,000 Units by mouth daily.    . Cyanocobalamin (VITAMIN B-12) 6000 MCG SUBL Place 1 tablet under the tongue daily.    Marland Kitchen. levothyroxine (SYNTHROID) 100 MCG tablet TAKE 1 TABLET(100 MCG) BY MOUTH DAILY 90 tablet 3  . Multiple Vitamins-Minerals (MULTIVITAMIN WITH MINERALS) tablet Take 1 tablet by mouth daily.    Marland Kitchen. omeprazole (PRILOSEC) 20 MG capsule TAKE 1 CAPSULE(20 MG) BY MOUTH DAILY 30 capsule 11  . polyethylene glycol (MIRALAX / GLYCOLAX) packet Take 17 g by mouth daily.    . SF 5000 PLUS 1.1 % CREA dental cream APPLY A PEA-SIZED AMOUNT TO TOOTHBRUSH IN PLACE OF NORMAL PASTE;  ...  (REFER TO PRESCRIPTION NOTES).  0  . vitamin E 100 UNIT capsule Take 100 Units by mouth daily.    Marland Kitchen. albuterol (PROVENTIL HFA;VENTOLIN HFA) 108 (90 Base) MCG/ACT inhaler Inhale 2 puffs into the lungs every 4 (four) hours as needed for wheezing or shortness of breath.      No current facility-administered medications on file prior to visit.     Allergies  Allergen Reactions  . Codeine Nausea And Vomiting  . Adhesive [Tape] Rash    Paper tape is okay  . Latex Rash    Probably adhesive allergy.  bandaids bother patient    Past Medical History:  Diagnosis Date  . Anxiety   . Arthritis    finger!!  . Barrett's esophagus 2000  . GERD (gastroesophageal reflux disease)   . History of hiatal hernia   . Hypothyroidism   . 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 Intractable vomiting with nausea    occurs with each obstructive episode. if patient gets up during meal to let food pass, then n & v is not so bad  . Midgut volvulus   . SBO (small bowel obstruction) (HCC) 11/13/2016  . Small bowel obstruction (HCC)   . Small bowel obstruction due to postoperative adhesions 06/29/2017  . Thyroid disease     Past Surgical History:  Procedure Laterality Date  . APPENDECTOMY  2002  . LAPAROTOMY N/A 09/28/2017   Procedure: EXPLORATORY LAPAROTOMY  WITH LYSIS OF ADHESIONS;  Surgeon: Ancil Linsey, MD;  Location: [facility] ORS;  Service: General;  Laterality: N/A;  . PARTIAL COLECTOMY  2002   large polyp  . TONSILLECTOMY    . TUBAL LIGATION      Family History  Problem Relation Age of Onset  . Leukemia Mother        CLL  . Leukemia Father   . Hyperlipidemia Brother   . Stroke Brother   . Heart disease Paternal Uncle   . Diabetes Neg Hx     Social History   Socioeconomic History  . Marital status: Married    Spouse name: Not on file  . Number of children: 3  . Years of education: Not on file  . Highest education level: Not on file  Occupational History   . Occupation: Diplomatic Services operational officer  . 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 Financial resource strain: Not on file  . Food insecurity    Worry: Not on file    Inability: Not on file  . Transportation needs    Medical: Not on file    Non-medical: Not on file  Tobacco Use  . Smoking status: Never Smoker  . Smokeless tobacco: Never Used  Substance and Sexual Activity  . Alcohol use: Yes    Comment: rare wine   . Drug use: No  . Sexual activity: Yes  Lifestyle  . Physical activity    Days per week: Not on file    Minutes per session: Not on file  . Stress: Not on file  Relationships  . Social Musician on phone: Not on file    Gets together: Not on file    Attends religious service: Not on file    Active member of club or organization: Not on file    Attends meetings of clubs or organizations: Not on file    Relationship status: Not on file  . Intimate partner violence    Fear of current or ex partner: Not on file    Emotionally abused: Not on file    Physically abused: Not on file    Forced sexual activity: Not on file  Other Topics Concern  . Not on file  Social History Narrative   2 sons, 1 daughter--- Brett Canales in [place] in [place], Selinda Orion in [place]      Has a living will   Daughter [name], then sons, should make health care decisions   Would accept resuscitation attempts   Not sure about tube feeds   Review of Systems No suspicious skin lesions Wears seat belt No chest pain or palpitations No cough or SOB No dizziness or syncope Some back pain--usually doesn't need meds. Will rarely take 1/2 ibuprofen No sig joint pain No dysuria. Some urge incontinence at night (or first thing in AM). Wears protection    Objective:   Physical Exam  Constitutional: She is oriented to person, place, and time. She appears well-developed. No distress.  HENT:  Mouth/Throat: Oropharynx is clear and moist. No oropharyngeal exudate.  Neck: No  thyromegaly present.  Cardiovascular: Normal rate, regular rhythm, normal heart sounds and intact distal pulses. Exam reveals no gallop.  No murmur heard. Respiratory: Effort normal and breath sounds normal. No respiratory distress. She has no wheezes. She has no rales.  GI: Soft. There is no abdominal tenderness.  Musculoskeletal:        General: No tenderness or edema.  Lymphadenopathy:    She has no cervical adenopathy.  Neurological: She is alert and oriented to person, place, and time.  President--- "Dwaine Deter, Bush" [id number] Recall  3/3  Skin: No rash noted. No erythema.  Psychiatric: She has a normal mood and affect. Her behavior is normal.           Assessment & Plan:

## 2018-12-09 NOTE — Assessment & Plan Note (Signed)
See social history 

## 2018-12-09 NOTE — Assessment & Plan Note (Signed)
Seems to be euthyroid Will check labs 

## 2018-12-09 NOTE — Assessment & Plan Note (Signed)
No dysphagia with the PPI Will continue daily

## 2018-12-09 NOTE — Assessment & Plan Note (Signed)
I have personally reviewed the Medicare Annual Wellness questionnaire and have noted 1. The patient's medical and social history 2. Their use of alcohol, tobacco or illicit drugs 3. Their current medications and supplements 4. The patient's functional ability including ADL's, fall risks, home safety risks and hearing or visual             impairment. 5. Diet and physical activities 6. Evidence for depression or mood disorders  The patients weight, height, BMI and visual acuity have been recorded in the chart I have made referrals, counseling and provided education to the patient based review of the above and I have provided the pt with a written personalized care plan for preventive services.  I have provided you with a copy of your personalized plan for preventive services. Please take the time to review along with your updated medication list.  No cancer screening due to age Flu vaccine at [facility] Td if any injury Trying to exercise

## 2018-12-10 DIAGNOSIS — S7292XD Unspecified fracture of left femur, subsequent encounter for closed fracture with routine healing: Secondary | ICD-10-CM | POA: Diagnosis not present

## 2018-12-10 LAB — CBC
HCT: 38.5 % (ref 36.0–46.0)
Hemoglobin: 13 g/dL (ref 12.0–15.0)
MCHC: 33.7 g/dL (ref 30.0–36.0)
MCV: 89.2 fl (ref 78.0–100.0)
Platelets: 198 10*3/uL (ref 150.0–400.0)
RBC: 4.31 Mil/uL (ref 3.87–5.11)
RDW: 12.9 % (ref 11.5–15.5)
WBC: 7.1 10*3/uL (ref 4.0–10.5)

## 2018-12-10 LAB — COMPREHENSIVE METABOLIC PANEL
ALT: 15 U/L (ref 0–35)
AST: 21 U/L (ref 0–37)
Albumin: 4.2 g/dL (ref 3.5–5.2)
Alkaline Phosphatase: 62 U/L (ref 39–117)
BUN: 17 mg/dL (ref 6–23)
CO2: 31 mEq/L (ref 19–32)
Calcium: 9.7 mg/dL (ref 8.4–10.5)
Chloride: 102 mEq/L (ref 96–112)
Creatinine, Ser: 0.66 mg/dL (ref 0.40–1.20)
GFR: 85.61 mL/min (ref >60.00)
Glucose, Bld: 94 mg/dL (ref 70–99)
Potassium: 3.8 mEq/L (ref 3.5–5.1)
Sodium: 140 mEq/L (ref 135–145)
Total Bilirubin: 0.6 mg/dL (ref 0.2–1.2)
Total Protein: 6.8 g/dL (ref 6.0–8.3)

## 2018-12-10 LAB — TSH: TSH: 0.32 u[IU]/mL — ABNORMAL LOW (ref 0.35–4.50)

## 2018-12-10 LAB — T4, FREE: Free T4: 1.45 ng/dL (ref 0.60–1.60)

## 2018-12-13 DIAGNOSIS — S7292XD Unspecified fracture of left femur, subsequent encounter for closed fracture with routine healing: Secondary | ICD-10-CM | POA: Diagnosis not present

## 2018-12-16 DIAGNOSIS — S7292XD Unspecified fracture of left femur, subsequent encounter for closed fracture with routine healing: Secondary | ICD-10-CM | POA: Diagnosis not present

## 2018-12-21 DIAGNOSIS — S7292XD Unspecified fracture of left femur, subsequent encounter for closed fracture with routine healing: Secondary | ICD-10-CM | POA: Diagnosis not present

## 2018-12-23 DIAGNOSIS — S7292XD Unspecified fracture of left femur, subsequent encounter for closed fracture with routine healing: Secondary | ICD-10-CM | POA: Diagnosis not present

## 2018-12-27 DIAGNOSIS — S7292XD Unspecified fracture of left femur, subsequent encounter for closed fracture with routine healing: Secondary | ICD-10-CM | POA: Diagnosis not present

## 2018-12-31 DIAGNOSIS — S7292XD Unspecified fracture of left femur, subsequent encounter for closed fracture with routine healing: Secondary | ICD-10-CM | POA: Diagnosis not present

## 2019-01-03 DIAGNOSIS — S7292XD Unspecified fracture of left femur, subsequent encounter for closed fracture with routine healing: Secondary | ICD-10-CM | POA: Diagnosis not present

## 2019-01-06 DIAGNOSIS — S7292XD Unspecified fracture of left femur, subsequent encounter for closed fracture with routine healing: Secondary | ICD-10-CM | POA: Diagnosis not present

## 2019-01-12 DIAGNOSIS — S7292XD Unspecified fracture of left femur, subsequent encounter for closed fracture with routine healing: Secondary | ICD-10-CM | POA: Diagnosis not present

## 2019-02-14 ENCOUNTER — Encounter: Payer: Self-pay | Admitting: Family Medicine

## 2019-02-14 ENCOUNTER — Other Ambulatory Visit: Payer: Self-pay

## 2019-02-14 ENCOUNTER — Ambulatory Visit (INDEPENDENT_AMBULATORY_CARE_PROVIDER_SITE_OTHER): Payer: Medicare Other | Admitting: Family Medicine

## 2019-02-14 ENCOUNTER — Ambulatory Visit (INDEPENDENT_AMBULATORY_CARE_PROVIDER_SITE_OTHER)
Admission: RE | Admit: 2019-02-14 | Discharge: 2019-02-14 | Disposition: A | Discharge status: Home / Self Care | Payer: Medicare Other | Source: Ambulatory Visit | Attending: Family Medicine | Admitting: Family Medicine

## 2019-02-14 VITALS — BP 120/72 | HR 81 | Temp 97.3°F | Ht 60.25 in | Wt 122.5 lb

## 2019-02-14 DIAGNOSIS — R9389 Abnormal findings on diagnostic imaging of other specified body structures: Secondary | ICD-10-CM

## 2019-02-14 DIAGNOSIS — M25552 Pain in left hip: Secondary | ICD-10-CM

## 2019-02-14 MED ORDER — DIAZEPAM 2 MG PO TABS: ORAL_TABLET | ORAL | 0 refills | Status: DC

## 2019-02-14 NOTE — Progress Notes (Signed)
Maddax Palinkas T. Dalten Ambrosino, MD Primary Care and [place] at [facility] [place], [address] Phone: [phone]  FAX: [phone]  Diana Owen - 84 y.o. female  MRN [id number]  Date of Birth: 07-Jun-1934  Visit Date: 02/14/2019  PCP: Venia Carbon, MD  Referred by: Venia Carbon, MD  Chief Complaint  Patient presents with  . Hip Pain    Left   Subjective:   Margene Cherian is a 84 y.o. very pleasant female patient with Body mass index is 23.73 kg/m. who presents with the following:  Left hip pain: prior ? intratrochanteric region of L femur without follow-up.  She is a very pleasant lady, 84 years old, and prior to 6 months ago she was functioning without a cane or walker, now she is using a walker with difficulty ambulating.  On her prior left hip film there was a question of possible intertrochanteric fracture or occult fracture.  Patient was lost to follow-up this is the first time I am evaluating her.  She has significant pain in the hip region as well in the posterior aspect of her pelvis and some down into her leg.  She is not having any numbness or tingling.  Past Medical History, Surgical History, Social History, Family History, Problem List, Medications, and Allergies have been reviewed and updated if relevant.  Patient Active Problem List   Diagnosis Date Noted  . Mood disorder ([place]) 12/09/2018  . Left hip pain 10/25/2018  . Preventative health care 11/26/2017  . Vertigo 11/26/2017  . Advance directive discussed with patient 11/26/2017  . Other idiopathic scoliosis, thoracolumbar region 07/16/2016  . Esophageal dysphagia 09/26/2015  . Acid reflux 09/04/2015  . Hyperlipidemia 09/04/2015  . Hypothyroidism 09/04/2015    Past Medical History:  Diagnosis Date  . Anxiety   . Arthritis    finger!!  . Barrett's esophagus 2000  . GERD (gastroesophageal reflux disease)   . History of hiatal hernia   .  Hypothyroidism   . Intestinal adhesions with complete obstruction ([place]) 04/25/2017  . Intractable vomiting with nausea    occurs with each obstructive episode. if patient gets up during meal to let food pass, then n & v is not so bad  . Midgut volvulus   . SBO (small bowel obstruction) ([place]) 11/13/2016  . Small bowel obstruction ([place])   . Small bowel obstruction due to postoperative adhesions 06/29/2017  . Thyroid disease     Past Surgical History:  Procedure Laterality Date  . APPENDECTOMY  2002  . LAPAROTOMY N/A 09/28/2017   Procedure: EXPLORATORY LAPAROTOMY WITH LYSIS OF ADHESIONS;  Surgeon: Vickie Epley, MD;  Location: [facility] ORS;  Service: General;  Laterality: N/A;  . PARTIAL COLECTOMY  2002   large polyp  . TONSILLECTOMY    . TUBAL LIGATION      Social History   Socioeconomic History  . Marital status: Married    Spouse name: Not on file  . Number of children: 3  . Years of education: Not on file  . Highest education level: Not on file  Occupational History  . Occupation: Network engineer  . 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 Financial resource strain: Not on file  . Food insecurity    Worry: Not on file    Inability: Not on file  . Transportation needs    Medical: Not on file    Non-medical: Not on file  Tobacco Use  . Smoking status: Never Smoker  . Smokeless tobacco: Never Used  Substance and Sexual Activity  . Alcohol use: Yes    Comment: rare wine   . Drug use: No  . Sexual activity: Yes  Lifestyle  . Physical activity    Days per week: Not on file    Minutes per session: Not on file  . Stress: Not on file  Relationships  . Social Musicianconnections    Talks on phone: Not on file    Gets together: Not on file    Attends religious service: Not on file    Active member of club or organization: Not on file    Attends meetings of clubs or organizations: Not on file    Relationship status: Not on file  . Intimate partner violence     Fear of current or ex partner: Not on file    Emotionally abused: Not on file    Physically abused: Not on file    Forced sexual activity: Not on file  Other Topics Concern  . Not on file  Social History Narrative   2 sons, 1 daughter--- [name] in [place]   [name] in [place], [name] in [place]      Has a living will   Daughter Darel HongJudy, then sons, should make health care decisions   Would accept resuscitation attempts   Not sure about tube feeds    Family History  Problem Relation Age of Onset  . Leukemia Mother        CLL  . Leukemia Father   . Hyperlipidemia Brother   . Stroke Brother   . Heart disease Paternal Uncle   . Diabetes Neg Hx     Allergies  Allergen Reactions  . Codeine Nausea And Vomiting  . Adhesive [Tape] Rash    Paper tape is okay  . Latex Rash    Probably adhesive allergy.  bandaids bother patient    Medication list reviewed and [facility] in full in [facility].  GEN: No fevers, chills. Nontoxic. Primarily MSK c/o today. MSK: Detailed in the HPI GI: tolerating PO intake without difficulty Neuro: No numbness, parasthesias, or tingling associated. Otherwise the pertinent positives of the ROS are noted above.   Objective:   BP 120/72   Pulse 81   Temp (!) 97.3 F (36.3 C) (Temporal)   Ht 5' 0.25" (1.53 m)   Wt 122 lb 8 oz (55.6 kg)   SpO2 96%   BMI 23.73 kg/m    GEN: WDWN, NAD, Non-toxic, Alert & Oriented x 3 HEENT: Atraumatic, Normocephalic.  Ears and Nose: No external deformity. EXTR: No clubbing/cyanosis/edema NEURO: She is walking with very significant difficulty using a rolling walker PSYCH: Normally interactive. Conversant. Not depressed or anxious appearing.  Calm demeanor.    Hip exam is ligamenta secondary to pain.  She has about 20 degrees of motion but does have pain with terminal internal range of motion.  She does have a positive C sign.  She also has some pain at the trochanteric bursa.  Terminal movements in all direction  cause pain.  She has pain with walking in this region at the anterior hip  Radiology: CLINICAL DATA:  Left hip pain while walking, initial encounter  EXAM: DG HIP (WITH OR WITHOUT PELVIS) 3V LEFT  COMPARISON:  None.  FINDINGS: Pelvic ring is intact. Postsurgical changes are noted in the distal rectum. Vague lucency is noted within the intratrochanteric region of the left femur. Possibility of  incomplete occult fracture would deserve consideration. At the patient's clinical symptomatology persists MRI may be helpful.  IMPRESSION: Vague lucency within the intratrochanteric region which may represent an incomplete occult fracture. MRI may be helpful if symptomatology persists.   Electronically Signed   By: Alcide Clever M.D.   On: 10/25/2018 17:17  Dg Hip Unilat With Pelvis 2-3 Views Left  Result Date: 02/14/2019 CLINICAL DATA:  Left hip pain. EXAM: DG HIP (WITH OR WITHOUT PELVIS) 2-3V LEFT COMPARISON:  Radiographs dated 10/25/2018 FINDINGS: Bones of the left hip appear normal. No significant joint space narrowing. The left sacroiliac joint appears normal. There is a moderate lumbar scoliosis with convexity to the left centered at L2-3. No evidence of stress fracture or other significant bone abnormality. IMPRESSION: 1. Normal appearing left hip. 2. Lumbar scoliosis with convexity to the left at L2-3. Electronically Signed   By: Francene Boyers M.D.   On: 02/14/2019 14:58    Assessment and Plan:     ICD-10-CM   1. Acute hip pain, left  M25.552 DG HIP UNILAT WITH PELVIS 2-3 VIEWS LEFT    MR HIP LEFT WO CONTRAST  2. Abnormal x-ray  R93.89 DG HIP UNILAT WITH PELVIS 2-3 VIEWS LEFT    MR HIP LEFT WO CONTRAST   6 months of hip pain, worsening over time.  Baseline status change from walking without any assistive devices to now using a rolling walker secondary to hip pain and groin pain.  Definitive diagnosis is needed in this case.  Obtain an MR of the left hip without contrast  to evaluate for occult fracture or stress fracture.  Follow-up will entirely be based on MRI  Follow-up: No follow-ups on file.  Meds ordered this encounter  Medications  . diazepam (VALIUM) 2 MG tablet    Sig: Take 1 tab 40 minutes before MRI    Dispense:  2 tablet    Refill:  0   Orders Placed This Encounter  Procedures  . DG HIP UNILAT WITH PELVIS 2-3 VIEWS LEFT  . MR HIP LEFT WO CONTRAST    Signed,  Arwen Haseley T. Analeigh Aries, MD   Outpatient Encounter Medications as of 02/14/2019  Medication Sig  . cholecalciferol (VITAMIN D) 1000 units tablet Take 1,000 Units by mouth daily.  . Cyanocobalamin (VITAMIN B-12) 6000 MCG SUBL Place 1 tablet under the tongue daily.  Marland Kitchen levothyroxine (SYNTHROID) 100 MCG tablet TAKE 1 TABLET(100 MCG) BY MOUTH DAILY  . Multiple Vitamins-Minerals (MULTIVITAMIN WITH MINERALS) tablet Take 1 tablet by mouth daily.  Marland Kitchen omeprazole (PRILOSEC) 20 MG capsule TAKE 1 CAPSULE(20 MG) BY MOUTH DAILY  . polyethylene glycol (MIRALAX / GLYCOLAX) packet Take 17 g by mouth daily.  . SF 5000 PLUS 1.1 % CREA dental cream APPLY A PEA-SIZED AMOUNT TO TOOTHBRUSH IN PLACE OF NORMAL PASTE; ...  (REFER TO PRESCRIPTION NOTES).  . vitamin E 100 UNIT capsule Take 100 Units by mouth daily.  Marland Kitchen albuterol (PROVENTIL HFA;VENTOLIN HFA) 108 (90 Base) MCG/ACT inhaler Inhale 2 puffs into the lungs every 4 (four) hours as needed for wheezing or shortness of breath.   . diazepam (VALIUM) 2 MG tablet Take 1 tab 40 minutes before MRI   No facility-administered encounter medications on file as of 02/14/2019.

## 2019-02-14 NOTE — Patient Instructions (Signed)
They will call you with your MRI appointment

## 2019-02-15 ENCOUNTER — Telehealth: Payer: Self-pay

## 2019-02-15 NOTE — Addendum Note (Signed)
Addended by: Carter Kitten on: 02/15/2019 03:40 PM   Modules accepted: Orders

## 2019-02-15 NOTE — Telephone Encounter (Signed)
[place] Night - Client Nonclinical Telephone Record AccessNurse Client [place] Night - Client Client Site [place] Physician Owens Loffler - MD Contact Type Call Who Is Calling Patient / Member / Family / Caregiver Caller Name [place] Phone Number [phone] Call Type Message Only Information Provided Reason for Call Returning a Call from the Office Initial [place] states she got a phone call about 2 hrs ago and she was wondering if the call was in regards to her MRI. Additional Comment Provided caller with office hours Disp. Time Disposition Final User 02/14/2019 5:58:51 PM General Information Provided Yes Rulon Eisenmenger Call Closed By: Rulon Eisenmenger Transaction Date/Time: 02/14/2019 5:54:47 PM (ET)

## 2019-02-15 NOTE — Telephone Encounter (Signed)
MRI has been scheduled with patient.

## 2019-02-21 ENCOUNTER — Encounter: Payer: Self-pay | Admitting: Internal Medicine

## 2019-02-21 ENCOUNTER — Ambulatory Visit (INDEPENDENT_AMBULATORY_CARE_PROVIDER_SITE_OTHER): Payer: Medicare Other | Admitting: Internal Medicine

## 2019-02-21 ENCOUNTER — Other Ambulatory Visit: Payer: Self-pay

## 2019-02-21 DIAGNOSIS — N3000 Acute cystitis without hematuria: Secondary | ICD-10-CM | POA: Diagnosis not present

## 2019-02-21 LAB — POC URINALSYSI DIPSTICK (AUTOMATED)
Bilirubin, UA: NEGATIVE
Glucose, UA: NEGATIVE
Ketones, UA: NEGATIVE
Nitrite, UA: NEGATIVE
Protein, UA: NEGATIVE
Spec Grav, UA: 1.01 (ref 1.010–1.025)
Urobilinogen, UA: 0.2 E.U./dL
pH, UA: 6 (ref 5.0–8.0)

## 2019-02-21 MED ORDER — SULFAMETHOXAZOLE-TRIMETHOPRIM 400-80 MG PO TABS: 1.0000 | ORAL_TABLET | Freq: Two times a day (BID) | ORAL | 0 refills | Status: DC

## 2019-02-21 NOTE — Assessment & Plan Note (Signed)
Same symptoms as her past urine infection No systemic symptoms Will treat with septra bid for 3 days if symptoms resolve quickly Defer culture since no infection in 4 years

## 2019-02-21 NOTE — Patient Instructions (Signed)
Please start the antibiotic right away and take another dose at bedtime. If your urinary symptoms are gone by tomorrow morning, you can stop it after 3 days (and save the rest in case your symptoms come back at some time in the next few months). If the symptoms are not gone tomorrow, take the medication for the full week.

## 2019-02-21 NOTE — Progress Notes (Signed)
Subjective:    Patient ID: Diana Owen, female    DOB: 1935-02-26, 84 y.o.   MRN: [id number]  HPI Here because she thinks she has another urinary infection  This visit occurred during the SARS-CoV-2 public health emergency.  Safety protocols were in place, including screening questions prior to the visit, additional usage of staff PPE, and extensive cleaning of exam room while observing appropriate contact time as indicated for disinfecting solutions.   Has a "tingling, excited" feeling inside Not quite painful Felt it 3 days ago---slightly better after increased water and cranberry juice Then recurred this morning No hematuria  No abdominal pain --did have some last week No new back pain  No fever Did have brief chill 3 nights ago--none since  Current Outpatient Medications on File Prior to Visit  Medication Sig Dispense Refill  . cholecalciferol (VITAMIN D) 1000 units tablet Take 1,000 Units by mouth daily.    . Cyanocobalamin (VITAMIN B-12) 6000 MCG SUBL Place 1 tablet under the tongue daily.    . diazepam (VALIUM) 2 MG tablet Take 1 tab 40 minutes before MRI 2 tablet 0  . levothyroxine (SYNTHROID) 100 MCG tablet TAKE 1 TABLET(100 MCG) BY MOUTH DAILY 90 tablet 3  . Multiple Vitamins-Minerals (MULTIVITAMIN WITH MINERALS) tablet Take 1 tablet by mouth daily.    Marland Kitchen omeprazole (PRILOSEC) 20 MG capsule TAKE 1 CAPSULE(20 MG) BY MOUTH DAILY 30 capsule 11  . polyethylene glycol (MIRALAX / GLYCOLAX) packet Take 17 g by mouth daily.    . SF 5000 PLUS 1.1 % CREA dental cream APPLY A PEA-SIZED AMOUNT TO TOOTHBRUSH IN PLACE OF NORMAL PASTE; ...  (REFER TO PRESCRIPTION NOTES).  0  . vitamin E 100 UNIT capsule Take 100 Units by mouth daily.    Marland Kitchen albuterol (PROVENTIL HFA;VENTOLIN HFA) 108 (90 Base) MCG/ACT inhaler Inhale 2 puffs into the lungs every 4 (four) hours as needed for wheezing or shortness of breath.      No current facility-administered medications on file prior to visit.      Allergies  Allergen Reactions  . Codeine Nausea And Vomiting  . Adhesive [Tape] Rash    Paper tape is okay  . Latex Rash    Probably adhesive allergy.  bandaids bother patient    Past Medical History:  Diagnosis Date  . Anxiety   . Arthritis    finger!!  . Barrett's esophagus 2000  . GERD (gastroesophageal reflux disease)   . History of hiatal hernia   . Hypothyroidism   . Intestinal adhesions with complete obstruction ([place]) 04/25/2017  . Intractable vomiting with nausea    occurs with each obstructive episode. if patient gets up during meal to let food pass, then n & v is not so bad  . Midgut volvulus   . SBO (small bowel obstruction) ([place]) 11/13/2016  . Small bowel obstruction ([place])   . Small bowel obstruction due to postoperative adhesions 06/29/2017  . Thyroid disease     Past Surgical History:  Procedure Laterality Date  . APPENDECTOMY  2002  . LAPAROTOMY N/A 09/28/2017   Procedure: EXPLORATORY LAPAROTOMY WITH LYSIS OF ADHESIONS;  Surgeon: Vickie Epley, MD;  Location: [facility] ORS;  Service: General;  Laterality: N/A;  . PARTIAL COLECTOMY  2002   large polyp  . TONSILLECTOMY    . TUBAL LIGATION      Family History  Problem Relation Age of Onset  . Leukemia Mother        CLL  . Leukemia Father   .  Hyperlipidemia Brother   . Stroke Brother   . Heart disease Paternal Uncle   . Diabetes Neg Hx     Social History   Socioeconomic History  . Marital status: Married    Spouse name: Not on file  . Number of children: 3  . Years of education: Not on file  . Highest education level: Not on file  Occupational History  . Occupation: Diplomatic Services operational officer  . 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 Financial resource strain: Not on file  . Food insecurity    Worry: Not on file    Inability: Not on file  . Transportation needs    Medical: Not on file    Non-medical: Not on file  Tobacco Use  . Smoking status: Never Smoker  . Smokeless tobacco:  Never Used  Substance and Sexual Activity  . Alcohol use: Yes    Comment: rare wine   . Drug use: No  . Sexual activity: Yes  Lifestyle  . Physical activity    Days per week: Not on file    Minutes per session: Not on file  . Stress: Not on file  Relationships  . Social Musician on phone: Not on file    Gets together: Not on file    Attends religious service: Not on file    Active member of club or organization: Not on file    Attends meetings of clubs or organizations: Not on file    Relationship status: Not on file  . Intimate partner violence    Fear of current or ex partner: Not on file    Emotionally abused: Not on file    Physically abused: Not on file    Forced sexual activity: Not on file  Other Topics Concern  . Not on file  Social History Narrative   2 sons, 1 daughter--- Brett Canales in [place] in [place], Selinda Orion in [place]      Has a living will   Daughter [name], then sons, should make health care decisions   Would accept resuscitation attempts   Not sure about tube feeds   Review of Systems  No N/V 2 loose stools after all the cranberry juice Appetite is okay     Objective:   Physical Exam  Constitutional: She appears well-developed. No distress.  GI: Soft. She exhibits no distension. There is no abdominal tenderness. There is no rebound and no guarding.  Musculoskeletal:     Comments: No CVA tenderness           Assessment & Plan:

## 2019-02-21 NOTE — Addendum Note (Signed)
Addended by: Pilar Grammes on: 02/21/2019 03:17 PM   Modules accepted: Orders

## 2019-02-23 ENCOUNTER — Ambulatory Visit: Payer: Medicare Other

## 2019-02-24 ENCOUNTER — Ambulatory Visit (HOSPITAL_COMMUNITY)
Admission: RE | Admit: 2019-02-24 | Discharge: 2019-02-24 | Disposition: A | Discharge status: Home / Self Care | Payer: Medicare Other | Source: Ambulatory Visit | Attending: Family Medicine | Admitting: Family Medicine

## 2019-02-24 ENCOUNTER — Other Ambulatory Visit: Payer: Self-pay

## 2019-02-24 DIAGNOSIS — M25552 Pain in left hip: Secondary | ICD-10-CM | POA: Insufficient documentation

## 2019-02-24 DIAGNOSIS — R9389 Abnormal findings on diagnostic imaging of other specified body structures: Secondary | ICD-10-CM | POA: Insufficient documentation

## 2019-02-28 ENCOUNTER — Telehealth: Payer: Self-pay

## 2019-02-28 MED ORDER — PREDNISONE 20 MG PO TABS: ORAL_TABLET | ORAL | 0 refills | Status: DC

## 2019-02-28 NOTE — Telephone Encounter (Signed)
See result note.  

## 2019-02-28 NOTE — Telephone Encounter (Signed)
Diana Owen notified as instructed by telephone.  Prednisone 20 mg prescription sent to [facility] as instructed by Dr. Lorelei Pont.  Diana Owen wants to know if her follow up appointment in 14 days can be virtual.  Please advise.

## 2019-02-28 NOTE — Telephone Encounter (Signed)
-----   Message from Owens Loffler, MD sent at 02/28/2019  4:51 PM EST ----- Please call - great news.  No problem with bone at all.    There is some significant tendonitis on the lateral hip and some bursitis, but no fracture or stress fracture.  Prednisone 20 mg, 2 tabs po for 5 days, then 1 tab po for 5 days, #15, 0 ref   And I want her to follow-up with me in 14 days

## 2019-02-28 NOTE — Telephone Encounter (Signed)
Pt left v/m requesting cb with lt hip MRI done on 02/24/19.Please advise. Pt saw Dr Lorelei Pont on 02/14/19.

## 2019-03-01 NOTE — Telephone Encounter (Signed)
Patient notified as instructed by telephone and verbalized understanding. Patient stated that she prefers a virtual follow-up visit. Appointment scheduled for 03/21/2019.

## 2019-03-01 NOTE — Telephone Encounter (Signed)
It can be if that is her preference - with her age she has risk coming into the office.   The only downside is that I could not do any procedures or injections.  I am comfortable with that since her mri was better than I expected.

## 2019-03-20 NOTE — Progress Notes (Signed)
Wilfrido Luedke T. Broderic Bara, MD Primary Care and Sports Medicine [facility] [address] [place], [address] Phone: [phone]  FAX: [phone]  Diana Owen - 85 y.o. female  MRN [id number]  Date of Birth: [birth year]  Visit Date: 03/21/2019  PCP: Karie Schwalbe, MD  Referred by: Karie Schwalbe, MD Chief Complaint  Patient presents with  . Follow-up    Left Hip pain   Virtual Visit via Video Note:  I connected with  Diana Owen on 03/21/2019 11:20 AM EST by a video enabled telemedicine application and verified that I am speaking with the correct person using two identifiers.   Location patient: home computer, tablet, or smartphone Location provider: work or home office Consent: Verbal consent directly obtained from Diana Owen. Persons participating in the virtual visit: patient, provider  I discussed the limitations of evaluation and management by telemedicine and the availability of in person appointments. The patient expressed understanding and agreed to proceed.  Interactive audio and video telecommunications were attempted between this provider and patient, however failed, due to patient having technical difficulties OR patient did not have access to video capability.  We continued and completed visit with audio only.    History of Present Illness:  She is here in follow-up after I saw her in February 14, 2019.  At that time she was having some significant hip pain, and there was also question of a possible intertrochanteric fracture seen on her initial x-ray in August 2020.  On her repeat film I did not see any appreciable fracture.  Given her decrease in function and significant pain and possible fracture noted on left-sided hip x-ray in August of that year I obtained a MRI of the left hip.  There was no evidence of acute fracture, chronic fracture, or avascular necrosis of the hip.  She did have some gluteus medius and  minimus tendinopathy.  She did have some partial thickness tearing of those tendons as well.  She also had some multilevel degenerative disc disease that was also seen as well.  She is here today virtually to follow-up and for recommendations.   Review of Systems as above: See pertinent positives and pertinent negatives per HPI No acute distress verbally  Past Medical History, Surgical History, Social History, Family History, Problem List, Medications, and Allergies have been reviewed and updated if relevant.   Observations/Objective/Exam:  An attempt was made to discern vital signs over the phone and per patient if applicable and possible.   General:    Alert, Oriented, appears well and in no acute distress HEENT:     Atraumatic, conjunctiva clear, no obvious abnormalities on inspection of external nose and ears.  Neck:    Normal movements of the head and neck Pulmonary:     On inspection no signs of respiratory distress, breathing rate appears normal, no obvious gross SOB, gasping or wheezing Cardiovascular:    No obvious cyanosis Musculoskeletal:    Moves all visible extremities without noticeable abnormality Psych / Neurological:     Pleasant and cooperative, no obvious depression or anxiety, speech and thought processing grossly intact  MR HIP LEFT WO CONTRAST  Result Date: 02/25/2019 CLINICAL DATA:  Left hip pain since approximately April, 2020. No known injury. EXAM: MR OF THE LEFT HIP WITHOUT CONTRAST TECHNIQUE: Multiplanar, multisequence MR imaging was performed. No intravenous contrast was administered. COMPARISON:  None. FINDINGS: Bones: There is no acute bony or joint abnormality. Marked convex left lumbar scoliosis  is seen and there is lower lumbar degenerative change with severe loss of disc space height at L2-3, L3-4 and L4-5. Reactive marrow signal change is present at L3-4. No subchondral cyst formation or edema about the hips. No avascular necrosis of the femoral  heads or worrisome marrow lesion. Articular cartilage and labrum Articular cartilage:  Minimally degenerated. Labrum: Intact. The anterior, superior labrum is mildly degenerated. Joint or bursal effusion Joint effusion:  None. Bursae: A small volume of fluid is seen in the left trochanteric bursa. Muscles and tendons Muscles and tendons: There is fluid about the distal left gluteus medius and minimus tendons. The anterior fibers of the left gluteus medius are torn from the greater trochanter with mild retraction approximately 2 cm. Posterior fibers of the tendon are intact. There is also partial tear of the gluteus minimus off the anterior aspect of the greater trochanter with little to no retraction. Tendons are otherwise intact. Other findings Miscellaneous: Imaged intrapelvic contents demonstrate no acute abnormality. Sigmoid diverticulosis is noted. IMPRESSION: Left gluteus medius and minimus tendinosis with partial tearing of both tendons. Small volume of fluid in the left trochanteric bursa compatible with bursitis. Negative for fracture or other acute abnormality. Convex left lumbar scoliosis and multilevel degenerative disease. Electronically Signed   By: Inge Rise M.D.   On: 02/25/2019 09:16     Assessment and Plan:    ICD-10-CM   1. Acute hip pain, left  M25.552   2. Tendinopathy of left gluteus medius  M67.952    >15 minutes spent in face to face time with patient, >50% spent in counselling or coordination of care  She is globally doing a lot better.  Her pain is diminished.  Her functionality has increased and she is trying to walk normally with a walker.  She is also doing some exercise tapes, and yesterday she did all of this and she feels a bit sore today.  I recommended that for now she continue with her exercise tapes, but on the days that she does these to take a break from walking.  On the days when she is not doing her exercise videos, then that would be a good day to do her  walks.  Continue doing this until she is feeling like she is basically back to full function  I discussed the assessment and treatment plan with the patient. The patient was provided an opportunity to ask questions and all were answered. The patient agreed with the plan and demonstrated an understanding of the instructions.   The patient was advised to call back or seek an in-person evaluation if the symptoms worsen or if the condition fails to improve as anticipated.  Follow-up: prn unless noted otherwise below No follow-ups on file.  No orders of the defined types were placed in this encounter.  No orders of the defined types were placed in this encounter.   Signed,  Maud Deed. Jacqulyne Gladue, MD

## 2019-03-21 ENCOUNTER — Encounter: Payer: Self-pay | Admitting: Family Medicine

## 2019-03-21 ENCOUNTER — Ambulatory Visit (INDEPENDENT_AMBULATORY_CARE_PROVIDER_SITE_OTHER): Payer: Medicare Other | Admitting: Family Medicine

## 2019-03-21 ENCOUNTER — Other Ambulatory Visit: Payer: Self-pay

## 2019-03-21 VITALS — Ht 60.25 in

## 2019-03-21 DIAGNOSIS — M25552 Pain in left hip: Secondary | ICD-10-CM | POA: Diagnosis not present

## 2019-03-21 DIAGNOSIS — M67952 Unspecified disorder of synovium and tendon, left thigh: Secondary | ICD-10-CM

## 2019-03-29 DIAGNOSIS — Z23 Encounter for immunization: Secondary | ICD-10-CM | POA: Diagnosis not present

## 2019-04-26 DIAGNOSIS — Z23 Encounter for immunization: Secondary | ICD-10-CM | POA: Diagnosis not present

## 2019-06-08 ENCOUNTER — Telehealth: Payer: Self-pay | Admitting: Internal Medicine

## 2019-06-08 NOTE — Telephone Encounter (Signed)
Patient will be coming with her husband,Gerald, to his appointment with Dr.Letvak on 06/13/19.  Patient wants to know if Dr.Letvak would have 5 minutes to look at her hip, during that appointment.  She said it's very difficult for her to come in for an appointment because she's taking care of her husband.  Please advise.

## 2019-06-08 NOTE — Telephone Encounter (Signed)
Dr Alphonsus Sias, should we put her on the schedule, also? We would have to double book his spot as there is only a 4pm for that day and his appt is in the morning.

## 2019-06-09 NOTE — Telephone Encounter (Signed)
Patient notified and added to schedule at 12:30.

## 2019-06-09 NOTE — Telephone Encounter (Signed)
Just put her on at 12:30PM and I will see her when I can

## 2019-06-13 ENCOUNTER — Other Ambulatory Visit: Payer: Self-pay

## 2019-06-13 ENCOUNTER — Encounter: Payer: Self-pay | Admitting: Internal Medicine

## 2019-06-13 ENCOUNTER — Ambulatory Visit (INDEPENDENT_AMBULATORY_CARE_PROVIDER_SITE_OTHER): Payer: Medicare Other | Admitting: Internal Medicine

## 2019-06-13 DIAGNOSIS — M25552 Pain in left hip: Secondary | ICD-10-CM | POA: Diagnosis not present

## 2019-06-13 IMAGING — CT CT ABD-PELV W/ CM
2 of 5 series · 16 of 46 positions shown, 18 images · IV contrast (APPLIED)
Comparison: 11/13/2016

CLINICAL DATA: 82-year-old female with abdominal pain and vomiting
for 1 day.

EXAM:
CT ABDOMEN AND PELVIS WITH CONTRAST
TECHNIQUE: Multidetector CT imaging of the abdomen and pelvis was performed
using the standard protocol following bolus administration of
intravenous contrast.
CONTRAST:  100mL [id number] IOPAMIDOL ([id number]-[id number]) INJECTION 61%

[Series 2: routine abd/pel with · axial · 0.69mm/px · z∈[-894,-564]mm · 13 of 74 slices shown, 15 images]
[im 4/74  soft-tissue]
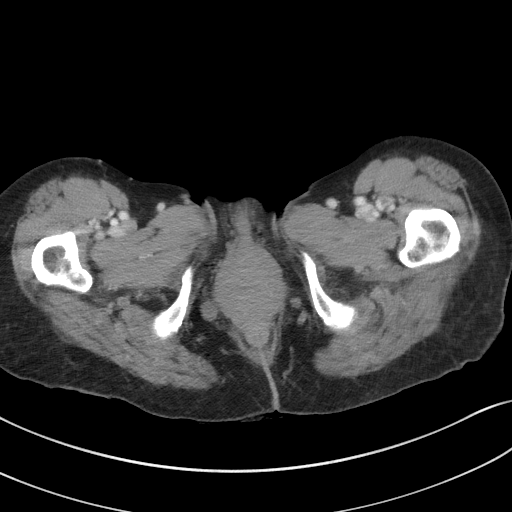
[im 4/74  bone]
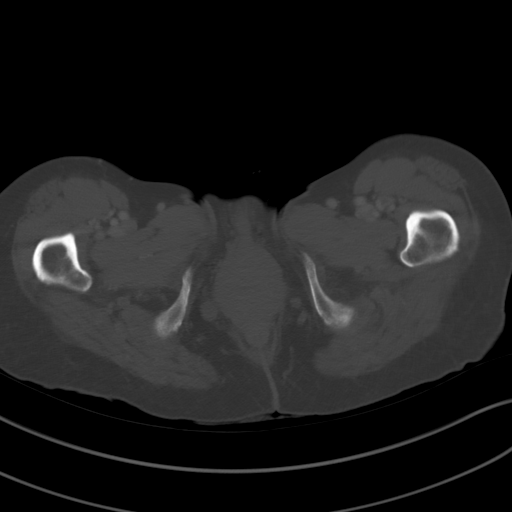
[im 11/74  soft-tissue]
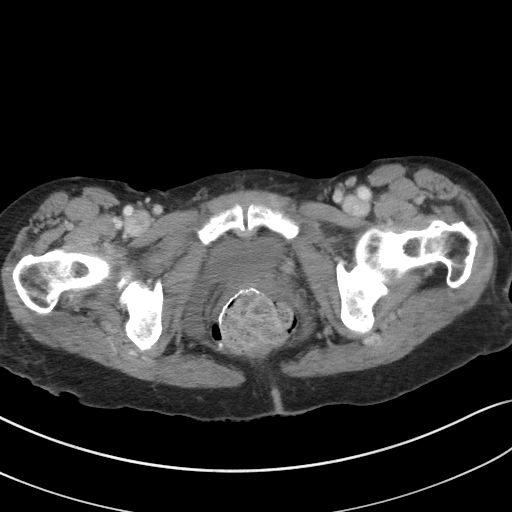
[im 15/74  soft-tissue]
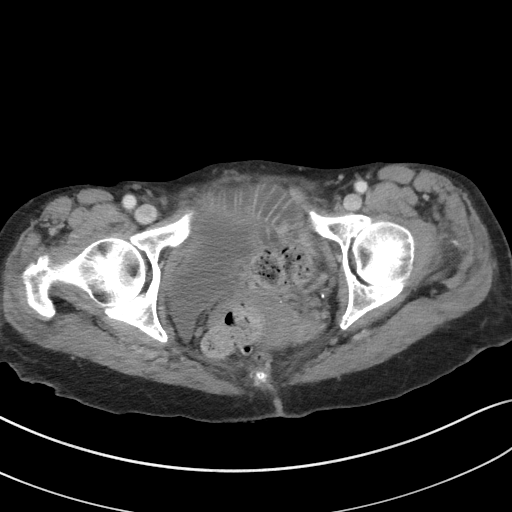
[im 22/74  soft-tissue]
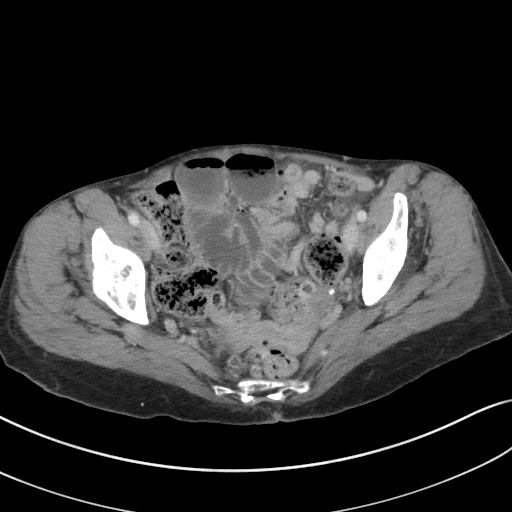
[im 26/74  soft-tissue]
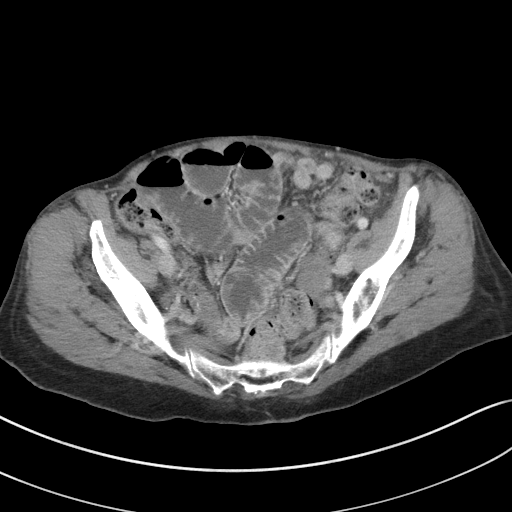
[im 33/74  soft-tissue]
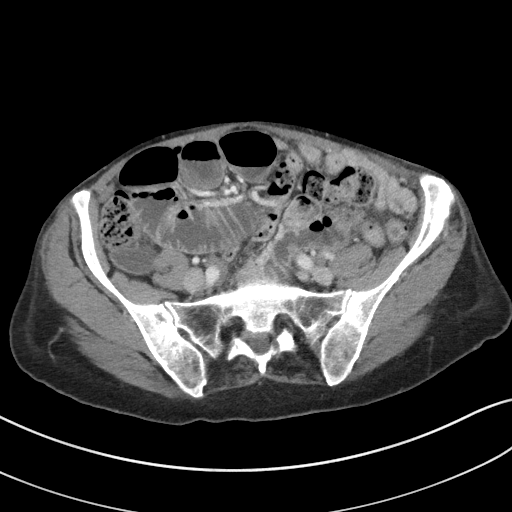
[im 37/74  soft-tissue]
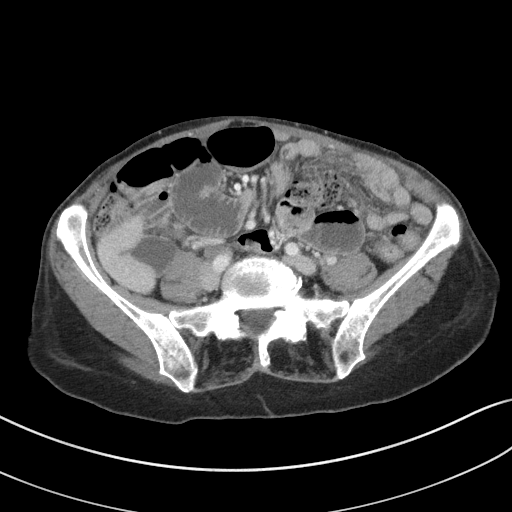
[im 41/74  soft-tissue]
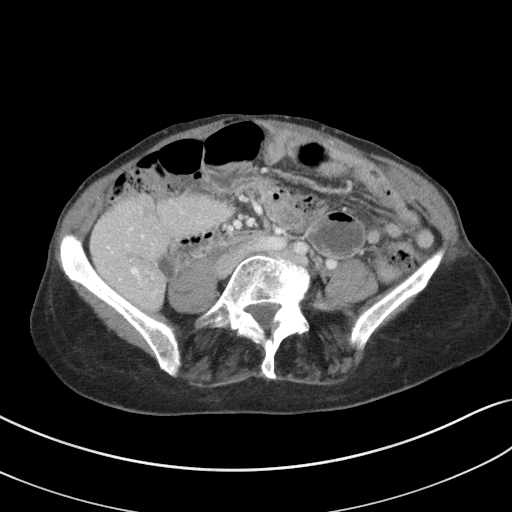
[im 48/74  soft-tissue]
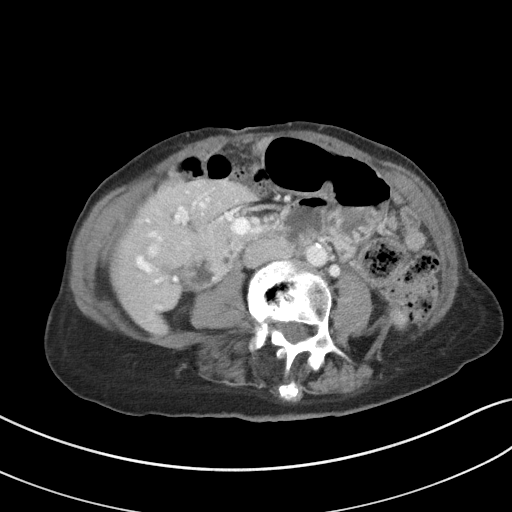
[im 48/74  bone]
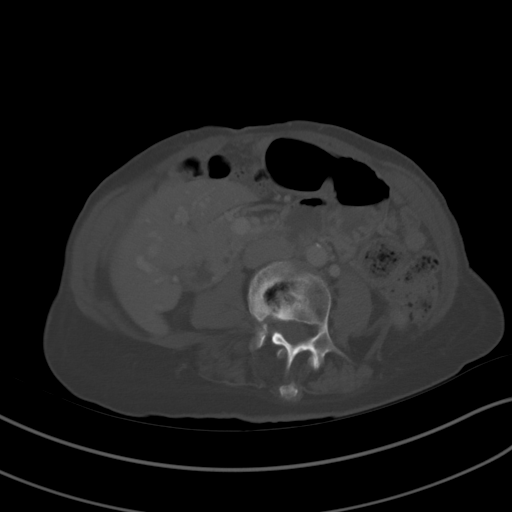
[im 52/74  soft-tissue]
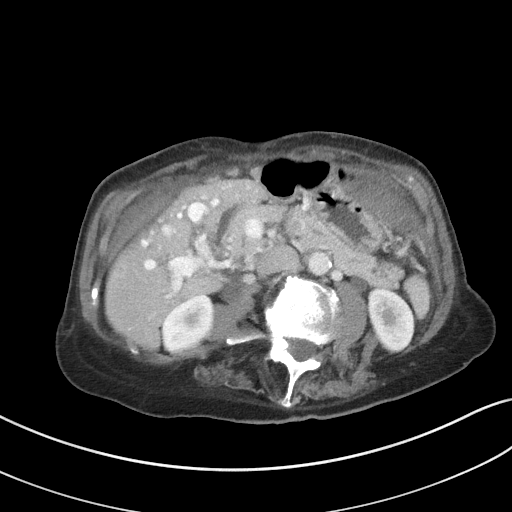
[im 59/74  soft-tissue]
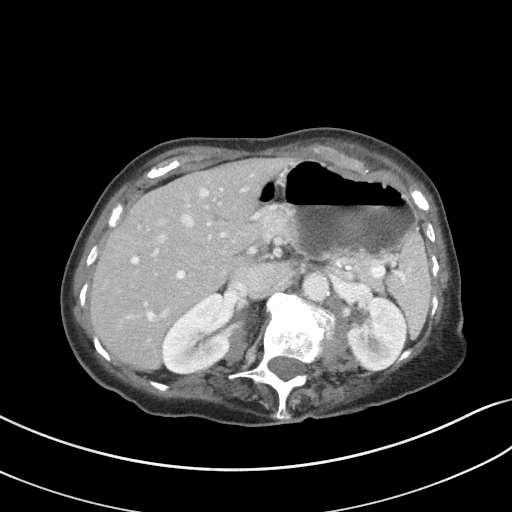
[im 63/74  soft-tissue]
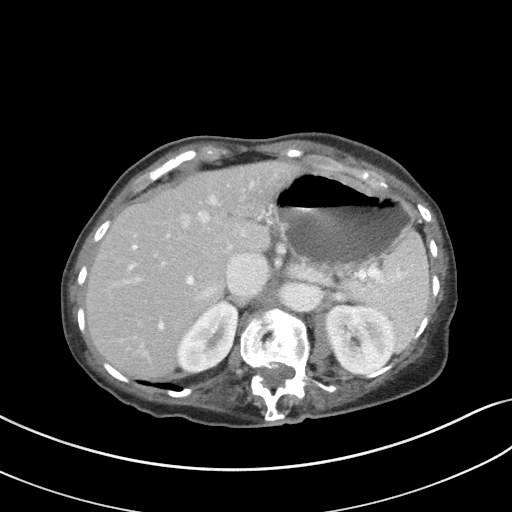
[im 70/74  soft-tissue]
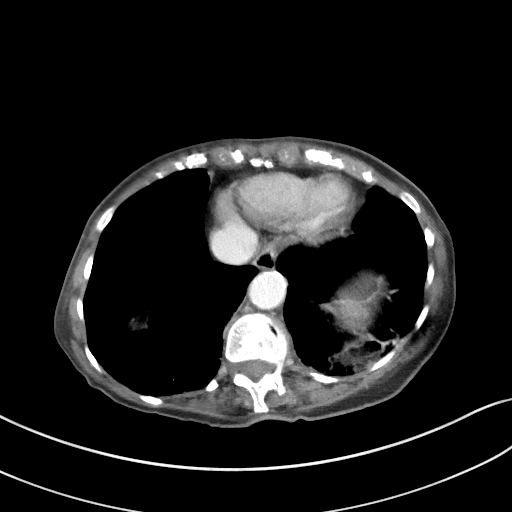

[Series 5: coronal st · coronal · 0.65mm/px · 3 of 72 slices shown]
[im 24/72  soft-tissue]
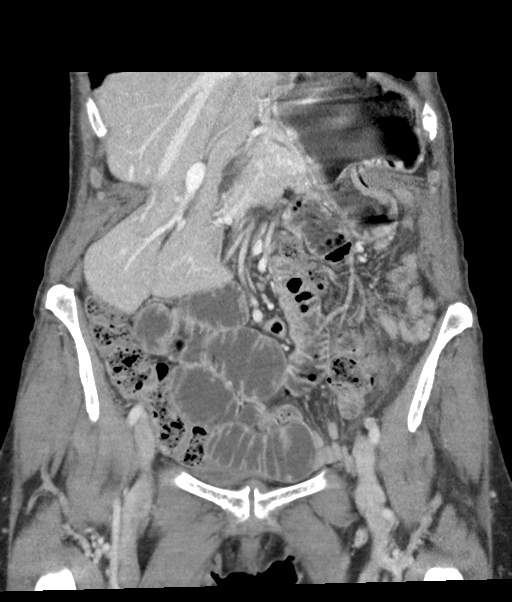
[im 32/72  soft-tissue]
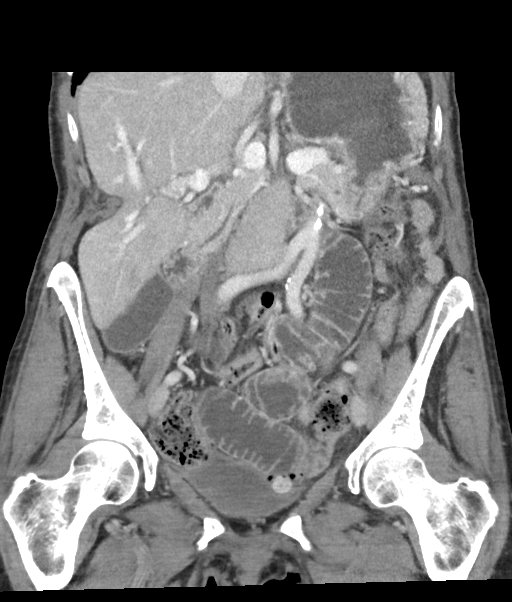
[im 40/72  soft-tissue]
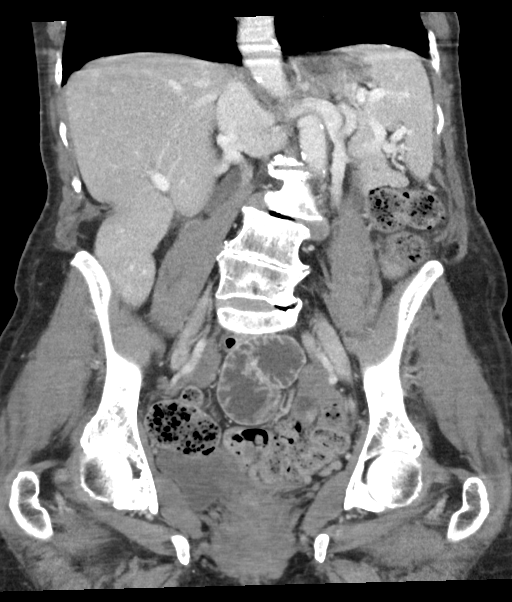

[16 of 46 positions shown; findings below may reference images not displayed]

FINDINGS: Lower chest: No acute abnormality. Left basilar scarring again
noted.

Hepatobiliary: Dilated portal veins again noted. No focal hepatic
lesions are identified. The gallbladder is unremarkable. No biliary
dilatation.

Pancreas: Unremarkable

Spleen: Unremarkable

Adrenals/Urinary Tract: Kidneys, adrenal glands and bladder are
unremarkable.

Stomach/Bowel: A high-grade small bowel obstruction is noted with
dilated proximal mid small bowel loops. The transition point lies
within the mid lower abdomen (series 2, image 37 and series 5, image
22) without obstructing cause identified. No definite bowel wall
thickening is noted. There is no evidence of pneumoperitoneum or
abscess. Surgical changes of the distal colon again noted.

Vascular/Lymphatic: Aortic atherosclerosis. No enlarged abdominal or
pelvic lymph nodes.

Reproductive: No significant changes or definite abnormality.

Other: No ascites or abdominal wall hernia.

Musculoskeletal: Thoraco lumbar scoliosis and degenerative changes
within the visualized spine again noted.
IMPRESSION: 1. High-grade small bowel obstruction with transition point within
the lower abdomen. Obstructing cause not identified and this may be
related to an adhesion or possibly internal hernia. No evidence of
ascites, pneumoperitoneum or abscess.
2.  Aortic Atherosclerosis ([id number]).

## 2019-06-13 IMAGING — DX DG ABDOMEN 1V
1 series · 1 of 1 positions shown · non-contrast
Comparison: CT 04/25/2017

CLINICAL DATA: NG tube placement

EXAM:
ABDOMEN - 1 VIEW

[abdomen kub]
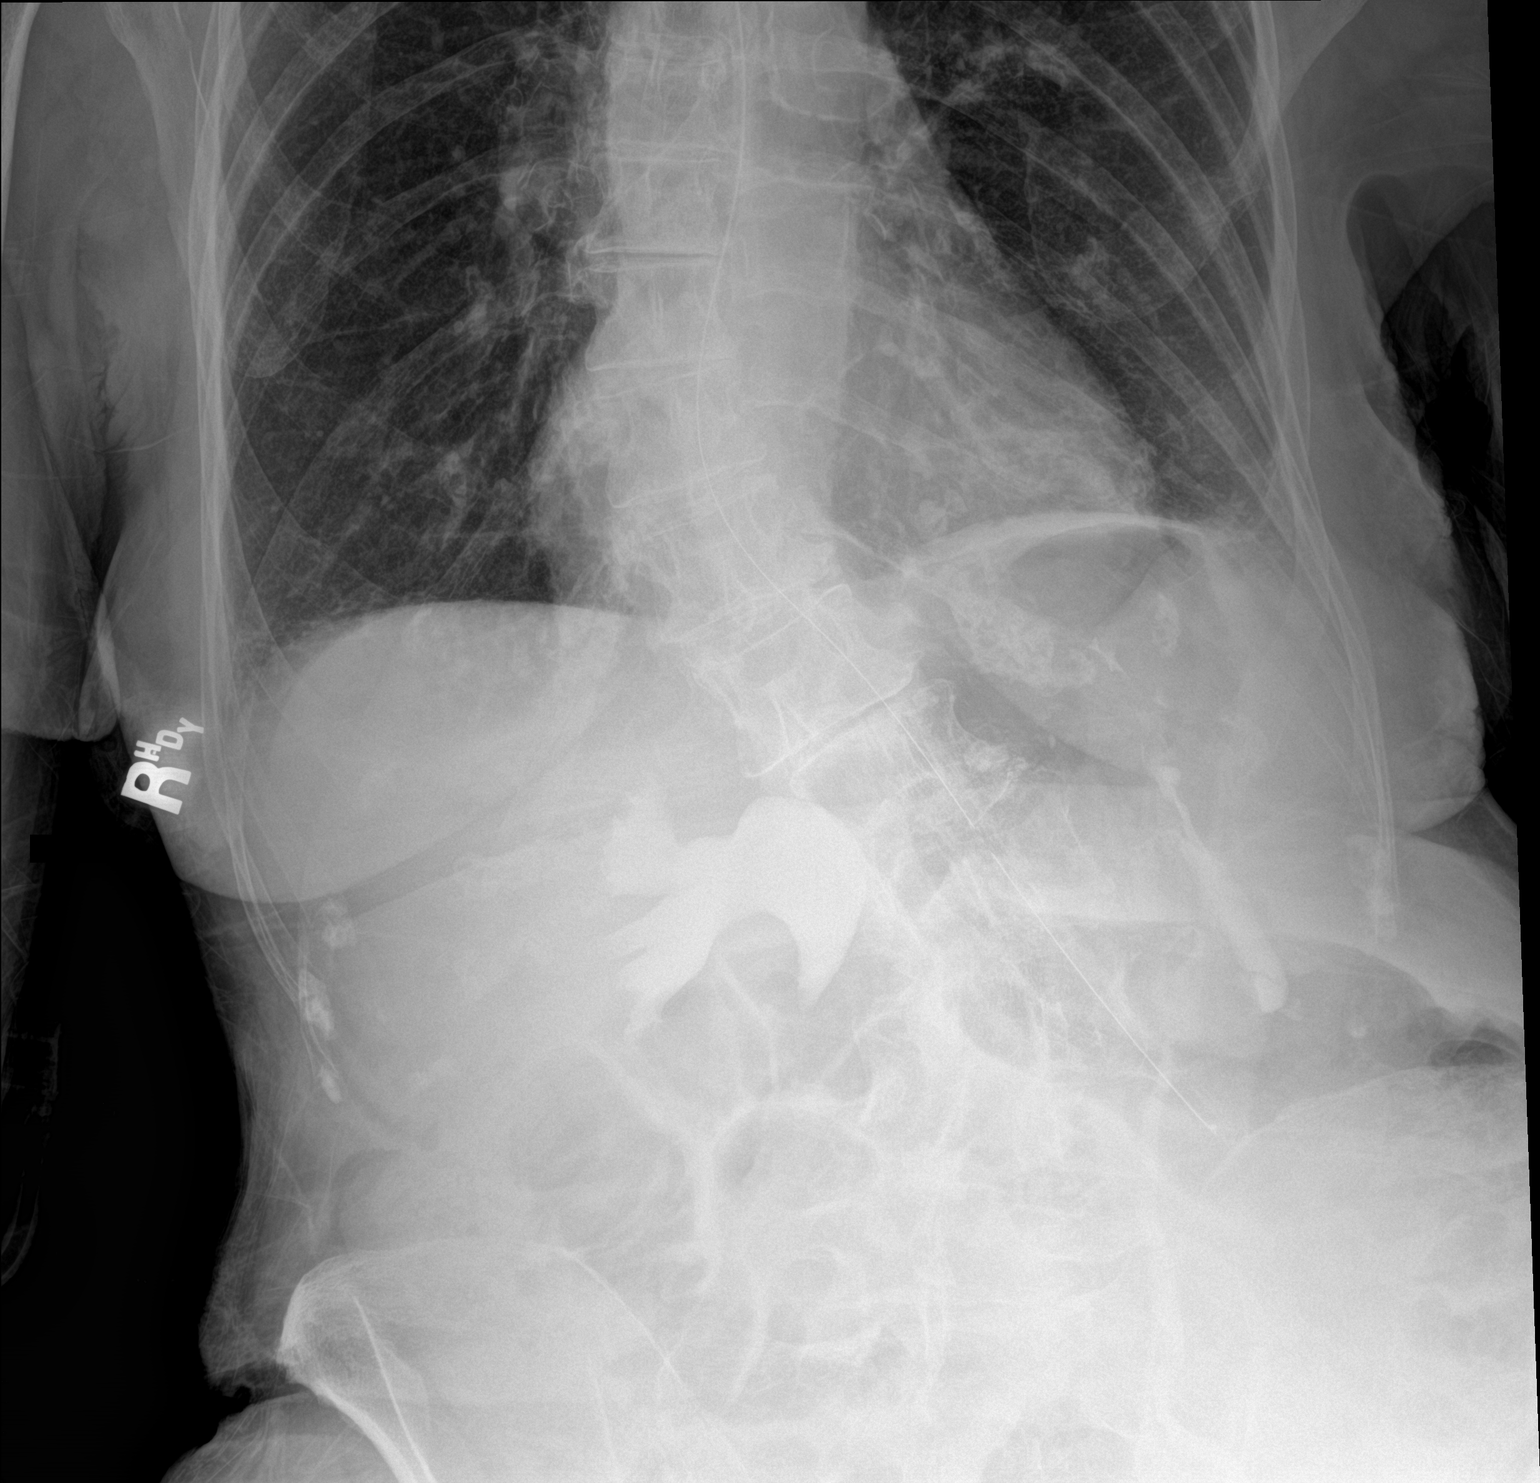

[1 of 1 positions shown; findings below may reference images not displayed]

FINDINGS: NG tube tip is in the mid stomach. Contrast material noted within
the renal collecting systems bilaterally from recent CT. Mild
gaseous distention of bowel loops again noted as seen on CT
compatible with small bowel obstruction.
IMPRESSION: NG tube tip noted in the mid stomach.

## 2019-06-13 NOTE — Assessment & Plan Note (Signed)
Persists Bursitis seems better but had tendon damage and some arthritic features as well Discussed heat Okay tylenol Will refer to PT at [facility]

## 2019-06-13 NOTE — Progress Notes (Signed)
Subjective:    Patient ID: Diana Owen, female    DOB: March 22, 1934, 85 y.o.   MRN: [id number]  HPI Here due to ongoing left hip pain This visit occurred during the SARS-CoV-2 public health emergency.  Safety protocols were in place, including screening questions prior to the visit, additional usage of staff PPE, and extensive cleaning of exam room while observing appropriate contact time as indicated for disinfecting solutions.   Reviewed notes from Dr Tonita Phoenix and MRI Relates back to fall in the fall Trouble even sleeping on that side Still limps---needs rollator  No pain meds  Current Outpatient Medications on File Prior to Visit  Medication Sig Dispense Refill  . cholecalciferol (VITAMIN D) 1000 units tablet Take 1,000 Units by mouth daily.    . Cyanocobalamin (VITAMIN B-12) 6000 MCG SUBL Place 1 tablet under the tongue daily.    Marland Kitchen levothyroxine (SYNTHROID) 100 MCG tablet TAKE 1 TABLET(100 MCG) BY MOUTH DAILY 90 tablet 3  . Multiple Vitamins-Minerals (MULTIVITAMIN WITH MINERALS) tablet Take 1 tablet by mouth daily.    Marland Kitchen omeprazole (PRILOSEC) 20 MG capsule TAKE 1 CAPSULE(20 MG) BY MOUTH DAILY 30 capsule 11  . polyethylene glycol (MIRALAX / GLYCOLAX) packet Take 17 g by mouth daily.    . SF 5000 PLUS 1.1 % CREA dental cream APPLY A PEA-SIZED AMOUNT TO TOOTHBRUSH IN PLACE OF NORMAL PASTE; ...  (REFER TO PRESCRIPTION NOTES).  0  . vitamin E 100 UNIT capsule Take 100 Units by mouth daily.     No current facility-administered medications on file prior to visit.    Allergies  Allergen Reactions  . Codeine Nausea And Vomiting  . Adhesive [Tape] Rash    Paper tape is okay  . Latex Rash    Probably adhesive allergy.  bandaids bother patient    Past Medical History:  Diagnosis Date  . Anxiety   . Arthritis    finger!!  . Barrett's esophagus 2000  . GERD (gastroesophageal reflux disease)   . History of hiatal hernia   . Hypothyroidism   . Intestinal adhesions with  complete obstruction ([place]) 04/25/2017  . Intractable vomiting with nausea    occurs with each obstructive episode. if patient gets up during meal to let food pass, then n & v is not so bad  . Midgut volvulus   . SBO (small bowel obstruction) ([place]) 11/13/2016  . Small bowel obstruction ([place])   . Small bowel obstruction due to postoperative adhesions 06/29/2017  . Thyroid disease     Past Surgical History:  Procedure Laterality Date  . APPENDECTOMY  2002  . LAPAROTOMY N/A 09/28/2017   Procedure: EXPLORATORY LAPAROTOMY WITH LYSIS OF ADHESIONS;  Surgeon: Vickie Epley, MD;  Location: [facility] ORS;  Service: General;  Laterality: N/A;  . PARTIAL COLECTOMY  2002   large polyp  . TONSILLECTOMY    . TUBAL LIGATION      Family History  Problem Relation Age of Onset  . Leukemia Mother        CLL  . Leukemia Father   . Hyperlipidemia Brother   . Stroke Brother   . Heart disease Paternal Uncle   . Diabetes Neg Hx     Social History   Socioeconomic History  . Marital status: Married    Spouse name: Not on file  . Number of children: 3  . Years of education: Not on file  . Highest education level: Not on file  Occupational History  . Occupation: Network engineer  .  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 Smoking status: Never Smoker  . Smokeless tobacco: Never Used  Substance and Sexual Activity  . Alcohol use: Yes    Comment: rare wine   . Drug use: No  . Sexual activity: Yes  Other Topics Concern  . Not on file  Social History Narrative   2 sons, 1 daughter--- Brett Canales in [place] in [place], Selinda Orion in [place]      Has a living will   Daughter [name], then sons, should make health care decisions   Would accept resuscitation attempts   Not sure about tube feeds   Social Determinants of Health   Financial Resource Strain:   . Difficulty of Paying Living Expenses:   Food Insecurity:   . Worried About Programme researcher, broadcasting/film/video in the Last Year:   . Occupational psychologist in the Last Year:   Transportation Needs:   . Freight forwarder (Medical):   Marland Kitchen Lack of Transportation (Non-Medical):   Physical Activity:   . Days of Exercise per Week:   . Minutes of Exercise per Session:   Stress:   . Feeling of Stress :   Social Connections:   . Frequency of Communication with Friends and Family:   . Frequency of Social Gatherings with Friends and Family:   . Attends Religious Services:   . Active Member of Clubs or Organizations:   . Attends Banker Meetings:   Marland Kitchen Marital Status:   Intimate Partner Violence:   . Fear of Current or Ex-Partner:   . Emotionally Abused:   Marland Kitchen Physically Abused:   . Sexually Abused:    Review of Systems  No fever Hasn't been sick     Objective:   Physical Exam  Musculoskeletal:     Comments: Some right posterior hip tenderness No bursa tenderness Flexion okay but limited internal and external rotation  Neurological:  Antalgic gait with limp Able to get up on table           Assessment & Plan:

## 2019-07-03 ENCOUNTER — Other Ambulatory Visit: Payer: Self-pay | Admitting: Internal Medicine

## 2019-07-14 DIAGNOSIS — M25552 Pain in left hip: Secondary | ICD-10-CM | POA: Diagnosis not present

## 2019-07-14 DIAGNOSIS — R262 Difficulty in walking, not elsewhere classified: Secondary | ICD-10-CM | POA: Diagnosis not present

## 2019-07-19 DIAGNOSIS — M25552 Pain in left hip: Secondary | ICD-10-CM | POA: Diagnosis not present

## 2019-07-19 DIAGNOSIS — R262 Difficulty in walking, not elsewhere classified: Secondary | ICD-10-CM | POA: Diagnosis not present

## 2019-07-21 DIAGNOSIS — M25552 Pain in left hip: Secondary | ICD-10-CM | POA: Diagnosis not present

## 2019-07-21 DIAGNOSIS — R262 Difficulty in walking, not elsewhere classified: Secondary | ICD-10-CM | POA: Diagnosis not present

## 2019-07-26 DIAGNOSIS — R262 Difficulty in walking, not elsewhere classified: Secondary | ICD-10-CM | POA: Diagnosis not present

## 2019-07-26 DIAGNOSIS — M25552 Pain in left hip: Secondary | ICD-10-CM | POA: Diagnosis not present

## 2019-07-28 DIAGNOSIS — M25552 Pain in left hip: Secondary | ICD-10-CM | POA: Diagnosis not present

## 2019-07-28 DIAGNOSIS — R262 Difficulty in walking, not elsewhere classified: Secondary | ICD-10-CM | POA: Diagnosis not present

## 2019-08-02 DIAGNOSIS — M25552 Pain in left hip: Secondary | ICD-10-CM | POA: Diagnosis not present

## 2019-08-02 DIAGNOSIS — R262 Difficulty in walking, not elsewhere classified: Secondary | ICD-10-CM | POA: Diagnosis not present

## 2019-08-04 DIAGNOSIS — M25552 Pain in left hip: Secondary | ICD-10-CM | POA: Diagnosis not present

## 2019-08-04 DIAGNOSIS — R262 Difficulty in walking, not elsewhere classified: Secondary | ICD-10-CM | POA: Diagnosis not present

## 2019-08-09 DIAGNOSIS — M25552 Pain in left hip: Secondary | ICD-10-CM | POA: Diagnosis not present

## 2019-08-09 DIAGNOSIS — R262 Difficulty in walking, not elsewhere classified: Secondary | ICD-10-CM | POA: Diagnosis not present

## 2019-08-25 DIAGNOSIS — R262 Difficulty in walking, not elsewhere classified: Secondary | ICD-10-CM | POA: Diagnosis not present

## 2019-08-25 DIAGNOSIS — M25552 Pain in left hip: Secondary | ICD-10-CM | POA: Diagnosis not present

## 2019-10-27 ENCOUNTER — Other Ambulatory Visit: Payer: Self-pay | Admitting: Internal Medicine

## 2019-12-06 ENCOUNTER — Telehealth: Payer: Self-pay

## 2019-12-06 NOTE — Telephone Encounter (Signed)
Pt left v/m that her husband has dementia for 5 years and now pt said she needs some help; I left v/m requesting pt to cb.

## 2019-12-06 NOTE — Telephone Encounter (Signed)
Patient called back stating that she has been taking care of her husband for over 5 years and is beginning to have problems coping with it. Patient stated that she is beginning to feel a little depressed. Patient stated that Dr. Alphonsus Sias has asked her before how she is coping and she said with a little wine in the evening. Patient stated that she can not drink wine in the mornings. Patient stated that she was talking with another resident and they told her that she was given a happy pill. Patient wants to know if Dr. Alphonsus Sias will prescribe her something to help her cope.  Pharmacy [facility] [facility]

## 2019-12-07 NOTE — Telephone Encounter (Signed)
She has her annual check up next week--we can discuss then. But there is no "happy pill" that will take away her stress of being the care giver. Her best release would be to hire help, so she can get out of the house and do other things sometimes (known as respite)

## 2019-12-07 NOTE — Telephone Encounter (Signed)
Spoke to pt. She will talk to Dr Alphonsus Sias then.

## 2019-12-16 ENCOUNTER — Other Ambulatory Visit: Payer: Self-pay

## 2019-12-16 ENCOUNTER — Encounter: Payer: Self-pay | Admitting: Internal Medicine

## 2019-12-16 ENCOUNTER — Ambulatory Visit (INDEPENDENT_AMBULATORY_CARE_PROVIDER_SITE_OTHER): Payer: Medicare Other | Admitting: Internal Medicine

## 2019-12-16 VITALS — BP 120/84 | HR 78 | Temp 97.0°F | Ht 60.5 in | Wt 124.0 lb

## 2019-12-16 DIAGNOSIS — R1319 Other dysphagia: Secondary | ICD-10-CM | POA: Diagnosis not present

## 2019-12-16 DIAGNOSIS — I700 Atherosclerosis of aorta: Secondary | ICD-10-CM | POA: Diagnosis not present

## 2019-12-16 DIAGNOSIS — F39 Unspecified mood [affective] disorder: Secondary | ICD-10-CM | POA: Diagnosis not present

## 2019-12-16 DIAGNOSIS — M4125 Other idiopathic scoliosis, thoracolumbar region: Secondary | ICD-10-CM | POA: Diagnosis not present

## 2019-12-16 DIAGNOSIS — E039 Hypothyroidism, unspecified: Secondary | ICD-10-CM | POA: Diagnosis not present

## 2019-12-16 DIAGNOSIS — Z0000 Encounter for general adult medical examination without abnormal findings: Secondary | ICD-10-CM | POA: Diagnosis not present

## 2019-12-16 DIAGNOSIS — Z7189 Other specified counseling: Secondary | ICD-10-CM | POA: Diagnosis not present

## 2019-12-16 LAB — CBC
HCT: 41.8 % (ref 36.0–46.0)
Hemoglobin: 14.1 g/dL (ref 12.0–15.0)
MCHC: 33.9 g/dL (ref 30.0–36.0)
MCV: 89.7 fl (ref 78.0–100.0)
Platelets: 208 10*3/uL (ref 150.0–400.0)
RBC: 4.65 Mil/uL (ref 3.87–5.11)
RDW: 13.6 % (ref 11.5–15.5)
WBC: 8.3 10*3/uL (ref 4.0–10.5)

## 2019-12-16 LAB — COMPREHENSIVE METABOLIC PANEL
ALT: 15 U/L (ref 0–35)
AST: 19 U/L (ref 0–37)
Albumin: 4.3 g/dL (ref 3.5–5.2)
Alkaline Phosphatase: 65 U/L (ref 39–117)
BUN: 18 mg/dL (ref 6–23)
CO2: 30 mEq/L (ref 19–32)
Calcium: 9.5 mg/dL (ref 8.4–10.5)
Chloride: 101 mEq/L (ref 96–112)
Creatinine, Ser: 0.68 mg/dL (ref 0.40–1.20)
GFR: 82.51 mL/min (ref >60.00)
Glucose, Bld: 94 mg/dL (ref 70–99)
Potassium: 3.9 mEq/L (ref 3.5–5.1)
Sodium: 140 mEq/L (ref 135–145)
Total Bilirubin: 0.9 mg/dL (ref 0.2–1.2)
Total Protein: 6.9 g/dL (ref 6.0–8.3)

## 2019-12-16 LAB — TSH: TSH: 0.43 u[IU]/mL (ref 0.35–4.50)

## 2019-12-16 LAB — T4, FREE: Free T4: 1.07 ng/dL (ref 0.60–1.60)

## 2019-12-16 MED ORDER — ALPRAZOLAM 0.25 MG PO TABS: 0.1250 mg | ORAL_TABLET | Freq: Three times a day (TID) | ORAL | 0 refills | Status: DC | PRN

## 2019-12-16 NOTE — Progress Notes (Signed)
Hearing Screening   Method: Audiometry   125Hz  250Hz  500Hz  1000Hz  2000Hz  3000Hz  4000Hz  6000Hz  8000Hz   Right ear:   40 40 40  40    Left ear:   40 40 40  40      Visual Acuity Screening   Right eye Left eye Both eyes  Without correction:     With correction: 20/25 20/40 20/25

## 2019-12-16 NOTE — Progress Notes (Signed)
Subjective:    Patient ID: Diana Owen, female    DOB: 05-31-1934, 85 y.o.   MRN: [id number]  HPI Here for Medicare wellness visit and follow up of chronic health conditions This visit occurred during the SARS-CoV-2 public health emergency.  Safety protocols were in place, including screening questions prior to the visit, additional usage of staff PPE, and extensive cleaning of exam room while observing appropriate contact time as indicated for disinfecting solutions.   Reviewed form and advanced directives Reviewed other doctors Still enjoys a small glass of wine nightly No tobacco Walking daily with husband---uses rollator (and usually cane inside) No falls Mild mood problems---stress and some degree of anhedonia (especially enjoys childrens' visit) Poor vision--needs recheck Hearing is fine Independent with instrumental ADLs No sig memory issues  Having emotional issues --mostly related to care giving strain for husband Feels a bit better since then Husband had been up at night--so she wasn't Now she is sleeping better since he is Also had skipped B12 and has restarted that She does have home health person 2 days per week [facility] 2 days a week also She helps with "sink bath"---won't go into tub He dresses himself with stand by. Uses bathroom but needs prompting. Not incontinent  Has had intermittent "unpleasant feeling" --at start of void (seems to be outside---?irritation from wet pad) Wears 2-3 pads at night for urge incontinence Symptom is gone now  Has marked scoliosis----to left . Then has pushing out of LLQ in abdomen Bowels move okay with miralax No blood No N/V  Continues on omeprazole most days No heartburn Still mild dysphagia with food  Continues on the thyroid Takes on empty stomach in AM  Aortic atherosclerosis seen on CXR 2 years ago  Current Outpatient Medications on File Prior to Visit  Medication Sig Dispense Refill  . cholecalciferol (VITAMIN  D) 1000 units tablet Take 1,000 Units by mouth daily.    . Cyanocobalamin (VITAMIN B-12) 6000 MCG SUBL Place 1 tablet under the tongue daily.    Marland Kitchen levothyroxine (SYNTHROID) 100 MCG tablet TAKE 1 TABLET(100 MCG) BY MOUTH DAILY 90 tablet 3  . Multiple Vitamins-Minerals (MULTIVITAMIN WITH MINERALS) tablet Take 1 tablet by mouth daily.    . Omega-3 Fatty Acids (FISH OIL PO) Take by mouth.    Marland Kitchen omeprazole (PRILOSEC) 20 MG capsule TAKE 1 CAPSULE(20 MG) BY MOUTH DAILY 30 capsule 11  . polyethylene glycol (MIRALAX / GLYCOLAX) packet Take 17 g by mouth daily.    . SF 5000 PLUS 1.1 % CREA dental cream APPLY A PEA-SIZED AMOUNT TO TOOTHBRUSH IN PLACE OF NORMAL PASTE; ...  (REFER TO PRESCRIPTION NOTES).  0  . vitamin E 100 UNIT capsule Take 100 Units by mouth daily.     No current facility-administered medications on file prior to visit.    Allergies  Allergen Reactions  . Codeine Nausea And Vomiting  . Adhesive [Tape] Rash    Paper tape is okay  . Latex Rash    Probably adhesive allergy.  bandaids bother patient    Past Medical History:  Diagnosis Date  . Anxiety   . Arthritis    finger!!  . Barrett's esophagus 2000  . GERD (gastroesophageal reflux disease)   . History of hiatal hernia   . Hypothyroidism   . 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 Intractable vomiting with nausea    occurs with each obstructive episode. if patient gets up during meal to let food pass, then n & v  is not so bad  . Midgut volvulus   . SBO (small bowel obstruction) (HCC) 11/13/2016  . Small bowel obstruction (HCC)   . Small bowel obstruction due to postoperative adhesions 06/29/2017  . Thyroid disease     Past Surgical History:  Procedure Laterality Date  . APPENDECTOMY  2002  . LAPAROTOMY N/A 09/28/2017   Procedure: EXPLORATORY LAPAROTOMY WITH LYSIS OF ADHESIONS;  Surgeon: Ancil Linsey, MD;  Location: [facility] ORS;  Service: General;  Laterality: N/A;  . PARTIAL COLECTOMY  2002     large polyp  . TONSILLECTOMY    . TUBAL LIGATION      Family History  Problem Relation Age of Onset  . Leukemia Mother        CLL  . Leukemia Father   . Hyperlipidemia Brother   . Stroke Brother   . Heart disease Paternal Uncle   . Diabetes Neg Hx     Social History   Socioeconomic History  . Marital status: Married    Spouse name: Not on file  . Number of children: 3  . Years of education: Not on file  . Highest education level: Not on file  Occupational History  . Occupation: Diplomatic Services operational officer  . 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 Smoking status: Never Smoker  . Smokeless tobacco: Never Used  Vaping Use  . Vaping Use: Never used  Substance and Sexual Activity  . Alcohol use: Yes    Comment: rare wine   . Drug use: No  . Sexual activity: Yes  Other Topics Concern  . Not on file  Social History Narrative   2 sons, 1 daughter--- Brett Canales in [place] in [place], Selinda Orion in [place]      Has a living will   Daughter [name], then sons, should make health care decisions   Would accept resuscitation attempts   Not sure about tube feeds   Social Determinants of Health   Financial Resource Strain:   . Difficulty of Paying Living Expenses: Not on file  Food Insecurity:   . Worried About Programme researcher, broadcasting/film/video in the Last Year: Not on file  . Ran Out of Food in the Last Year: Not on file  Transportation Needs:   . Lack of Transportation (Medical): Not on file  . Lack of Transportation (Non-Medical): Not on file  Physical Activity:   . Days of Exercise per Week: Not on file  . Minutes of Exercise per Session: Not on file  Stress:   . Feeling of Stress : Not on file  Social Connections:   . Frequency of Communication with Friends and Family: Not on file  . Frequency of Social Gatherings with Friends and Family: Not on file  . Attends Religious Services: Not on file  . Active Member of Clubs or Organizations: Not on file  . Attends Tax inspector Meetings: Not on file  . Marital Status: Not on file  Intimate Partner Violence:   . Fear of Current or Ex-Partner: Not on file  . Emotionally Abused: Not on file  . Physically Abused: Not on file  . Sexually Abused: Not on file   Review of Systems Appetite is good Weight is stable Sleeps okay unless husband is up Wears seat belt No longer has partial lower denture---affects what she can eat No skin problems No sig back or joint pains---except right 5th toe No chest pain or SOB No dizziness or syncope  No edema    Objective:   Physical Exam Constitutional:      Appearance: Normal appearance.  HENT:     Mouth/Throat:     Comments: No lesions Eyes:     Conjunctiva/sclera: Conjunctivae normal.     Pupils: Pupils are equal, round, and reactive to light.  Cardiovascular:     Rate and Rhythm: Normal rate and regular rhythm.     Pulses: Normal pulses.     Heart sounds: No murmur heard.  No gallop.   Pulmonary:     Effort: Pulmonary effort is normal.     Breath sounds: No wheezing, rhonchi or rales.  Abdominal:     Palpations: Abdomen is soft.     Tenderness: There is no abdominal tenderness.  Musculoskeletal:     Cervical back: Neck supple.     Right lower leg: No edema.     Left lower leg: No edema.     Comments: ;lumbar scoliosis to left  Lymphadenopathy:     Cervical: No cervical adenopathy.  Neurological:     Mental Status: She is alert and oriented to person, place, and time.     Comments: Chase Caller, ?" [id number] Recall 3/3  Psychiatric:        Mood and Affect: Mood normal.        Behavior: Behavior normal.            Assessment & Plan:

## 2019-12-16 NOTE — Assessment & Plan Note (Signed)
Seems to be euthyroid with levothyroxine Will check labs

## 2019-12-16 NOTE — Assessment & Plan Note (Signed)
Mostly caregiving stress No persistent depression Will give small amount of xanax for emergencies Needs to hire more help for respite

## 2019-12-16 NOTE — Patient Instructions (Signed)
Try zinc oxide cream by your vagina to protect the outside from irritation from the wet pads.

## 2019-12-16 NOTE — Assessment & Plan Note (Signed)
Mild symptoms persist Not severe Continues on the omeprazole (discussed not skipping doses)

## 2019-12-16 NOTE — Assessment & Plan Note (Signed)
No persistent pain Does press against abomen--but not really causing a problem

## 2019-12-16 NOTE — Assessment & Plan Note (Signed)
Seen on CXR Will hold off on statin

## 2019-12-16 NOTE — Assessment & Plan Note (Signed)
See social history 

## 2019-12-16 NOTE — Assessment & Plan Note (Signed)
I have personally reviewed the Medicare Annual Wellness questionnaire and have noted 1. The patient's medical and social history 2. Their use of alcohol, tobacco or illicit drugs 3. Their current medications and supplements 4. The patient's functional ability including ADL's, fall risks, home safety risks and hearing or visual             impairment. 5. Diet and physical activities 6. Evidence for depression or mood disorders  The patients weight, height, BMI and visual acuity have been recorded in the chart I have made referrals, counseling and provided education to the patient based review of the above and I have provided the pt with a written personalized care plan for preventive services.  I have provided you with a copy of your personalized plan for preventive services. Please take the time to review along with your updated medication list.  Done with cancer screening Flu and COVID boosters at [facility] Td if any injury Consider shingrix at pharmacy Is exercising regularly

## 2020-01-03 DIAGNOSIS — Z23 Encounter for immunization: Secondary | ICD-10-CM | POA: Diagnosis not present

## 2020-01-31 DIAGNOSIS — Z23 Encounter for immunization: Secondary | ICD-10-CM | POA: Diagnosis not present

## 2020-02-02 ENCOUNTER — Ambulatory Visit: Payer: Medicare Other | Admitting: Podiatry

## 2020-02-07 ENCOUNTER — Other Ambulatory Visit: Payer: Self-pay

## 2020-02-07 ENCOUNTER — Encounter: Payer: Self-pay | Admitting: Podiatry

## 2020-02-07 ENCOUNTER — Ambulatory Visit (INDEPENDENT_AMBULATORY_CARE_PROVIDER_SITE_OTHER): Payer: Medicare Other | Admitting: Podiatry

## 2020-02-07 DIAGNOSIS — B351 Tinea unguium: Secondary | ICD-10-CM

## 2020-02-07 DIAGNOSIS — M79674 Pain in right toe(s): Secondary | ICD-10-CM

## 2020-02-07 DIAGNOSIS — M79675 Pain in left toe(s): Secondary | ICD-10-CM

## 2020-02-07 NOTE — Progress Notes (Signed)
  Subjective:  Patient ID: Diana Owen, female    DOB: November 30, 1934,  MRN: [id number]  Chief Complaint  Patient presents with  . Nail Problem    nail trim and nail fungus   85 y.o. female returns for the above complaint.  Patient presents with thickened elongated dystrophic toenails x7.  Patient states painful to touch.  Patient is out of her take over so.  She would like for me to debride them down.  She denies any other acute complaints.  She has not seen anyone else prior to seeing me for this.  Objective:  There were no vitals filed for this visit. Podiatric Exam: Vascular: dorsalis pedis and posterior tibial pulses are palpable bilateral. Capillary return is immediate. Temperature gradient is WNL. Skin turgor WNL  Sensorium: Normal Semmes Weinstein monofilament test. Normal tactile sensation bilaterally. Nail Exam: Pt has thick disfigured discolored nails with subungual debris noted bilateral entire nail hallux through fifth toenails.  Pain on palpation to the nails. Ulcer Exam: There is no evidence of ulcer or pre-ulcerative changes or infection. Orthopedic Exam: Muscle tone and strength are WNL. No limitations in general ROM. No crepitus or effusions noted. HAV  B/L.  Hammer toes 2-5  B/L. Skin: No Porokeratosis. No infection or ulcers    Assessment & Plan:   1. Pain due to onychomycosis of toenails of both feet     Patient was evaluated and treated and all questions answered.  Onychomycosis with pain  -Nails palliatively debrided as below. -Educated on self-care  Procedure: Nail Debridement Rationale: pain  Type of Debridement: manual, sharp debridement. Instrumentation: Nail nipper, rotary burr. Number of Nails: 10  Procedures and Treatment: Consent by patient was obtained for treatment procedures. The patient understood the discussion of treatment and procedures well. All questions were answered thoroughly reviewed. Debridement of mycotic and hypertrophic toenails, 1  through 5 bilateral and clearing of subungual debris. No ulceration, no infection noted.  Return Visit-Office Procedure: Patient instructed to return to the office for a follow up visit 3 months for continued evaluation and treatment.  Nicholes Rough, DPM    No follow-ups on file.

## 2020-03-06 ENCOUNTER — Other Ambulatory Visit: Payer: Self-pay | Admitting: Internal Medicine

## 2020-03-06 NOTE — Telephone Encounter (Signed)
Last filled 12-16-19 #20 Last OV 12-16-19 Next OV 12-25-20 [facility] [address]

## 2020-05-10 ENCOUNTER — Ambulatory Visit: Payer: Medicare Other | Admitting: Podiatry

## 2020-05-17 ENCOUNTER — Ambulatory Visit: Payer: Medicare Other | Admitting: Podiatry

## 2020-05-24 ENCOUNTER — Encounter: Payer: Self-pay | Admitting: Podiatry

## 2020-05-24 ENCOUNTER — Ambulatory Visit (INDEPENDENT_AMBULATORY_CARE_PROVIDER_SITE_OTHER): Payer: Medicare Other | Admitting: Podiatry

## 2020-05-24 ENCOUNTER — Other Ambulatory Visit: Payer: Self-pay

## 2020-05-24 DIAGNOSIS — M79675 Pain in left toe(s): Secondary | ICD-10-CM | POA: Diagnosis not present

## 2020-05-24 DIAGNOSIS — B351 Tinea unguium: Secondary | ICD-10-CM | POA: Diagnosis not present

## 2020-05-24 DIAGNOSIS — M79674 Pain in right toe(s): Secondary | ICD-10-CM

## 2020-05-24 NOTE — Progress Notes (Signed)
  Subjective:  Patient ID: Diana Owen, female    DOB: 1934/06/19,  MRN: [id number]  Chief Complaint  Patient presents with  . Nail Problem    Nail trim RFC   86 y.o. female returns for the above complaint.  Patient presents with thickened elongated dystrophic toenails x7.  Patient states painful to touch.  Patient is out of her take over so.  She would like for me to debride them down.  She denies any other acute complaints.    Objective:  There were no vitals filed for this visit. Podiatric Exam: Vascular: dorsalis pedis and posterior tibial pulses are palpable bilateral. Capillary return is immediate. Temperature gradient is WNL. Skin turgor WNL  Sensorium: Normal Semmes Weinstein monofilament test. Normal tactile sensation bilaterally. Nail Exam: Pt has thick disfigured discolored nails with subungual debris noted bilateral entire nail hallux through fifth toenails.  Pain on palpation to the nails. Ulcer Exam: There is no evidence of ulcer or pre-ulcerative changes or infection. Orthopedic Exam: Muscle tone and strength are WNL. No limitations in general ROM. No crepitus or effusions noted. HAV  B/L.  Hammer toes 2-5  B/L. Skin: No Porokeratosis. No infection or ulcers    Assessment & Plan:   1. Pain due to onychomycosis of toenails of both feet     Patient was evaluated and treated and all questions answered.  Onychomycosis with pain  -Nails palliatively debrided as below. -Educated on self-care  Procedure: Nail Debridement Rationale: pain  Type of Debridement: manual, sharp debridement. Instrumentation: Nail nipper, rotary burr. Number of Nails: 10  Procedures and Treatment: Consent by patient was obtained for treatment procedures. The patient understood the discussion of treatment and procedures well. All questions were answered thoroughly reviewed. Debridement of mycotic and hypertrophic toenails, 1 through 5 bilateral and clearing of subungual debris. No ulceration, no  infection noted.  Return Visit-Office Procedure: Patient instructed to return to the office for a follow up visit 3 months for continued evaluation and treatment.  Nicholes Rough, DPM    No follow-ups on file.

## 2020-06-26 ENCOUNTER — Encounter: Payer: Self-pay | Admitting: Internal Medicine

## 2020-06-26 ENCOUNTER — Ambulatory Visit (INDEPENDENT_AMBULATORY_CARE_PROVIDER_SITE_OTHER): Payer: Medicare Other | Admitting: Internal Medicine

## 2020-06-26 ENCOUNTER — Other Ambulatory Visit: Payer: Self-pay

## 2020-06-26 VITALS — BP 118/68 | HR 54 | Temp 97.8°F | Ht 61.0 in | Wt 124.0 lb

## 2020-06-26 DIAGNOSIS — N3946 Mixed incontinence: Secondary | ICD-10-CM | POA: Insufficient documentation

## 2020-06-26 DIAGNOSIS — M419 Scoliosis, unspecified: Secondary | ICD-10-CM | POA: Diagnosis not present

## 2020-06-26 DIAGNOSIS — F39 Unspecified mood [affective] disorder: Secondary | ICD-10-CM

## 2020-06-26 NOTE — Progress Notes (Signed)
Subjective:    Patient ID: Diana Owen, female    DOB: 09-09-1934, 86 y.o.   MRN: [id number]  HPI Here due to stress and memory issues This visit occurred during the SARS-CoV-2 public health emergency.  Safety protocols were in place, including screening questions prior to the visit, additional usage of staff PPE, and extensive cleaning of exam room while observing appropriate contact time as indicated for disinfecting solutions.   Nerves had been pretty bad--so made the appointment Now better--husband in Memory Care now so less stress He has adjusted there pretty well  Now has some back problems Known scoliosis--uses rollator for stability (doesn't use it in the house) Not much pain--wonders about back exercises  Having some urinary leakage Unaware at times---so her pads are getting soaked No pain or blood with urine Not much trouble during the day---just some leakage Also some stress component  Had some memory issues Relates to the stress She will monitor this  Current Outpatient Medications on File Prior to Visit  Medication Sig Dispense Refill  . ALPRAZolam (XANAX) 0.25 MG tablet TAKE 1/2 TO 1 TABLET(0.125 TO 0.25 MG) BY MOUTH THREE TIMES DAILY AS NEEDED FOR ANXIETY 20 tablet 0  . cholecalciferol (VITAMIN D) 1000 units tablet Take 1,000 Units by mouth daily.    . Cyanocobalamin (VITAMIN B-12) 6000 MCG SUBL Place 1 tablet under the tongue daily.    Marland Kitchen levothyroxine (SYNTHROID) 100 MCG tablet TAKE 1 TABLET(100 MCG) BY MOUTH DAILY 90 tablet 3  . Multiple Vitamins-Minerals (MULTIVITAMIN WITH MINERALS) tablet Take 1 tablet by mouth daily.    . Omega-3 Fatty Acids (FISH OIL PO) Take by mouth.    Marland Kitchen omeprazole (PRILOSEC) 20 MG capsule TAKE 1 CAPSULE(20 MG) BY MOUTH DAILY 30 capsule 11  . polyethylene glycol (MIRALAX / GLYCOLAX) packet Take 17 g by mouth daily.    . SF 5000 PLUS 1.1 % CREA dental cream APPLY A PEA-SIZED AMOUNT TO TOOTHBRUSH IN PLACE OF NORMAL PASTE; ...  (REFER TO  PRESCRIPTION NOTES).  0  . vitamin E 100 UNIT capsule Take 100 Units by mouth daily.     No current facility-administered medications on file prior to visit.    Allergies  Allergen Reactions  . Codeine Nausea And Vomiting  . Adhesive [Tape] Rash    Paper tape is okay  . Latex Rash    Probably adhesive allergy.  bandaids bother patient    Past Medical History:  Diagnosis Date  . Anxiety   . Arthritis    finger!!  . Barrett's esophagus 2000  . GERD (gastroesophageal reflux disease)   . History of hiatal hernia   . Hypothyroidism   . 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 Intractable vomiting with nausea    occurs with each obstructive episode. if patient gets up during meal to let food pass, then n & v is not so bad  . Midgut volvulus   . SBO (small bowel obstruction) (HCC) 11/13/2016  . Small bowel obstruction (HCC)   . Small bowel obstruction due to postoperative adhesions 06/29/2017  . Thyroid disease     Past Surgical History:  Procedure Laterality Date  . APPENDECTOMY  2002  . LAPAROTOMY N/A 09/28/2017   Procedure: EXPLORATORY LAPAROTOMY WITH LYSIS OF ADHESIONS;  Surgeon: Ancil Linsey, MD;  Location: [facility] ORS;  Service: General;  Laterality: N/A;  . PARTIAL COLECTOMY  2002   large polyp  . TONSILLECTOMY    . TUBAL LIGATION  Family History  Problem Relation Age of Onset  . Leukemia Mother        CLL  . Leukemia Father   . Hyperlipidemia Brother   . Stroke Brother   . Heart disease Paternal Uncle   . Diabetes Neg Hx     Social History   Socioeconomic History  . Marital status: Married    Spouse name: Not on file  . Number of children: 3  . Years of education: Not on file  . Highest education level: Not on file  Occupational History  . Occupation: Diplomatic Services operational officer  . 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 Smoking status: Never Smoker  . Smokeless tobacco: Never Used  Vaping Use  . Vaping Use: Never  used  Substance and Sexual Activity  . Alcohol use: Yes    Comment: rare wine   . Drug use: No  . Sexual activity: Yes  Other Topics Concern  . Not on file  Social History Narrative   2 sons, 1 daughter--- Brett Canales in [place] in [place], Selinda Orion in [place]      Has a living will   Daughter [name], then sons, should make health care decisions   Would accept resuscitation attempts   Not sure about tube feeds   Social Determinants of Health   Financial Resource Strain: Not on file  Food Insecurity: Not on file  Transportation Needs: Not on file  Physical Activity: Not on file  Stress: Not on file  Social Connections: Not on file  Intimate Partner Violence: Not on file   Review of Systems Sleeping okay Appetite is fine    Objective:   Physical Exam Constitutional:      Appearance: Normal appearance.  Abdominal:     Palpations: Abdomen is soft.     Tenderness: There is no abdominal tenderness.     Comments: No suprapubic tenderness  Neurological:     Mental Status: She is alert.  Psychiatric:        Mood and Affect: Mood normal.        Behavior: Behavior normal.            Assessment & Plan:

## 2020-06-26 NOTE — Assessment & Plan Note (Signed)
Mostly anxiety with situational stress This is better now with husband in Memory Care building Has the xanax for prn

## 2020-06-26 NOTE — Assessment & Plan Note (Signed)
Significant curve to the left She gets around okay--doesn't need PT Recommended she check with Casimiro Needle the fitness director about general muscle strengthening

## 2020-06-26 NOTE — Assessment & Plan Note (Signed)
Nothing worrisome about symptoms--generally mild in the day Discussed protective garments and regular voiding

## 2020-07-11 ENCOUNTER — Other Ambulatory Visit: Payer: Self-pay | Admitting: Internal Medicine

## 2020-07-11 NOTE — Telephone Encounter (Signed)
Last filled 03-07-20 #20 Last OV 06-26-20 Next OV 01-01-21 [facility] [address]

## 2020-07-21 ENCOUNTER — Other Ambulatory Visit: Payer: Self-pay | Admitting: Internal Medicine

## 2020-08-02 DIAGNOSIS — Z23 Encounter for immunization: Secondary | ICD-10-CM | POA: Diagnosis not present

## 2020-08-23 DIAGNOSIS — B370 Candidal stomatitis: Secondary | ICD-10-CM | POA: Diagnosis not present

## 2020-08-30 ENCOUNTER — Ambulatory Visit: Payer: Medicare Other | Admitting: Podiatry

## 2020-10-09 ENCOUNTER — Telehealth: Payer: Self-pay | Admitting: *Deleted

## 2020-10-09 NOTE — Telephone Encounter (Signed)
Patient left a voicemail stating that she dropped off some [facility] paperwork over a week ago for Dr. Alphonsus Sias to fill out for her. Patient stated that she is calling to follow-up on the status of the paperwork.

## 2020-10-09 NOTE — Telephone Encounter (Signed)
Spoke to pt. I have her husband's form ready. Dr Alphonsus Sias did not think we needed to fill one out for her. She agreed. It is up front ready for pickup.

## 2020-10-17 ENCOUNTER — Other Ambulatory Visit: Payer: Self-pay

## 2020-10-17 ENCOUNTER — Emergency Department
Admission: EM | Admit: 2020-10-17 | Discharge: 2020-10-17 | Disposition: A | Discharge status: Home / Self Care | Payer: Medicare Other | Attending: Emergency Medicine | Admitting: Emergency Medicine

## 2020-10-17 DIAGNOSIS — F419 Anxiety disorder, unspecified: Secondary | ICD-10-CM | POA: Insufficient documentation

## 2020-10-17 DIAGNOSIS — M62838 Other muscle spasm: Secondary | ICD-10-CM

## 2020-10-17 DIAGNOSIS — Z91040 Latex allergy status: Secondary | ICD-10-CM | POA: Diagnosis not present

## 2020-10-17 DIAGNOSIS — R457 State of emotional shock and stress, unspecified: Secondary | ICD-10-CM | POA: Diagnosis not present

## 2020-10-17 DIAGNOSIS — M62830 Muscle spasm of back: Secondary | ICD-10-CM | POA: Diagnosis not present

## 2020-10-17 DIAGNOSIS — E039 Hypothyroidism, unspecified: Secondary | ICD-10-CM | POA: Insufficient documentation

## 2020-10-17 DIAGNOSIS — M545 Low back pain, unspecified: Secondary | ICD-10-CM | POA: Diagnosis not present

## 2020-10-17 DIAGNOSIS — M549 Dorsalgia, unspecified: Secondary | ICD-10-CM | POA: Diagnosis not present

## 2020-10-17 NOTE — ED Triage Notes (Signed)
BIB EMS from [facility]. Back spasms started at 0100.  Took lorazapam at 0300 and went back to sleep. Then woke up and was still having back spasms.  Vitals WNL for EMS. Pt states she "feels funny from the inside out and nervous feeling". Her husband is in memory care and her MD prescribed the lorazepam as needed but she doesn't take it. Today was the first time she has taken it in awhile. "I feel  jittery all inside".

## 2020-10-17 NOTE — ED Notes (Signed)
[facility] returned my call promptly and is sending transport.

## 2020-10-17 NOTE — Discharge Instructions (Addendum)
Use Tylenol for pain and fevers.  Up to 1000 mg per dose, up to 4 times per day.  Do not take more than 4000 mg of Tylenol/acetaminophen within 24 hours..  

## 2020-10-17 NOTE — ED Notes (Signed)
Called [facility]. Spoke with Raven. She will call back when transportation is on their way.

## 2020-10-17 NOTE — ED Notes (Signed)
Pt advised she has HX of panic attacks and now that she thinks about it, this feels like a panic attack she has had in the past.

## 2020-10-17 NOTE — ED Notes (Signed)
Was called back by Raven. She directed me to their social worker Portia. I had to leave a voice mail. Waiting for return call.

## 2020-10-17 NOTE — ED Provider Notes (Signed)
[facility] Emergency Department Provider Note ____________________________________________   Event Date/Time   First MD Initiated Contact with Patient 10/17/20 [phone]     (approximate)  I have reviewed the triage vital signs and the nursing notes.  HISTORY  Chief Complaint Back Pain   HPI Diana Owen is a 86 y.o. femalewho presents to the ED for evaluation of back pain  Chart review indicates history of anxiety, hypothyroidism.   Patient presents to the ED for evaluation of back spasming and anxiety.  She reports a relatively busy day yesterday with a long walk with her friends and out to dinner with her 2 sons.  She reports going to bed feeling normal.   Patient reports awakening early this morning at about 1 AM with back spasming and pain, causing her to have trouble sleeping.  She therefore took a lorazepam tablet that she is prescribed.  She reports transient increasing anxiety and feeling "jittery" consistent with her history of panic attacks.  Due to her back spasming and jittery sensation, she called 911.  By the time that I see the patient, she reports feeling much better after her lorazepam is kicked in.  She reports that she thinks she just had a panic attack, as her symptoms are consistent with her history of the same.  She denies any back pain, falls or injuries, frontal abdominal pain, syncopal episodes, fevers or recent illnesses.  She reports that she feels fine.  Past Medical History:  Diagnosis Date   Anxiety    Arthritis    finger!!   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 if patient gets up during meal to let food pass, then n & v is not so bad   Midgut volvulus    SBO (small bowel obstruction) (HCC) 11/13/2016   Small  bowel obstruction (HCC)    Small bowel obstruction due to postoperative adhesions 06/29/2017   Thyroid disease     Patient Active Problem List   Diagnosis Date Noted   Mixed stress and urge urinary incontinence 06/26/2020   Scoliosis 06/26/2020   Aortic atherosclerosis (HCC) 12/16/2019   Mood disorder (HCC) 12/09/2018   Left hip pain 10/25/2018   Preventative health care 11/26/2017   Vertigo 11/26/2017   Advance directive discussed with patient 11/26/2017   Other idiopathic scoliosis, thoracolumbar region 07/16/2016   Esophageal dysphagia 09/26/2015   Acid reflux 09/04/2015   Hyperlipidemia 09/04/2015   Hypothyroidism 09/04/2015    Past Surgical History:  Procedure Laterality Date   APPENDECTOMY  2002   LAPAROTOMY N/A 09/28/2017   Procedure: EXPLORATORY LAPAROTOMY WITH LYSIS OF ADHESIONS;  Surgeon: Ancil Linsey, MD;  Location: [facility] ORS;  Service: General;  Laterality: N/A;   PARTIAL COLECTOMY  2002   large polyp   TONSILLECTOMY     TUBAL LIGATION      Prior to Admission medications   Medication Sig Start Date End Date Taking? Authorizing Provider  ALPRAZolam (XANAX) 0.25 MG tablet TAKE 1/2 TO 1 TABLET(0.125 TO 0.25 MG) BY MOUTH THREE TIMES DAILY AS NEEDED FOR ANXIETY 07/11/20   Tillman Abide I, MD  cholecalciferol (VITAMIN D) 1000 units tablet Take 1,000 Units by mouth daily.    [provider]  Cyanocobalamin (VITAMIN B-12) 6000 MCG SUBL Place 1 tablet under the  tongue daily.    [provider]  levothyroxine (SYNTHROID) 100 MCG tablet TAKE 1 TABLET(100 MCG) BY MOUTH DAILY 07/23/20   Karie Schwalbe, MD  Multiple Vitamins-Minerals (MULTIVITAMIN WITH MINERALS) tablet Take 1 tablet by mouth daily.    [provider]  Omega-3 Fatty Acids (FISH OIL PO) Take by mouth.    [provider]  omeprazole (PRILOSEC) 20 MG capsule TAKE 1 CAPSULE(20 MG) BY MOUTH DAILY 10/27/19   Tillman Abide I, MD  polyethylene glycol (MIRALAX / GLYCOLAX) packet  Take 17 g by mouth daily.    [provider]  SF 5000 PLUS 1.1 % CREA dental cream APPLY A PEA-SIZED AMOUNT TO TOOTHBRUSH IN PLACE OF NORMAL PASTE; ...  (REFER TO PRESCRIPTION NOTES). 02/26/17   [provider]  vitamin E 100 UNIT capsule Take 100 Units by mouth daily.    [provider]    Allergies Codeine, Adhesive [tape], and Latex  Family History  Problem Relation Age of Onset   Leukemia Mother        CLL   Leukemia Father    Hyperlipidemia Brother    Stroke Brother    Heart disease Paternal Uncle    Diabetes Neg Hx     Social History Social History   Tobacco Use   Smoking status: Never   Smokeless tobacco: Never  Vaping Use   Vaping Use: Never used  Substance Use Topics   Alcohol use: Yes    Comment: rare wine    Drug use: No    Review of Systems  Constitutional: No fever/chills Eyes: No visual changes. ENT: No sore throat. Cardiovascular: Denies chest pain. Respiratory: Denies shortness of breath. Gastrointestinal: No abdominal pain.  No nausea, no vomiting.  No diarrhea.  No constipation. Genitourinary: Negative for dysuria. Musculoskeletal: Positive for back pain, resolved Skin: Negative for rash. Neurological: Negative for headaches, focal weakness or numbness.  ____________________________________________   PHYSICAL EXAM:  VITAL SIGNS: Vitals:   10/17/20 0930 10/17/20 1018  BP: 137/65 131/62  Pulse: 60 60  Resp:  16  Temp:  98.5 F (36.9 C)  SpO2: 93% 98%     Constitutional: Alert and oriented. Well appearing and in no acute distress. With minimal assistance, she stands and ambulates while I am in the room with a normal gait.  Denying back pain. Eyes: Conjunctivae are normal. PERRL. EOMI. Head: Atraumatic. Nose: No congestion/rhinnorhea. Mouth/Throat: Mucous membranes are moist.  Oropharynx non-erythematous. Neck: No stridor. No cervical spine tenderness to palpation. Cardiovascular: Normal rate, regular  rhythm. Grossly normal heart sounds.  Good peripheral circulation. Respiratory: Normal respiratory effort.  No retractions. Lungs CTAB. Gastrointestinal: Soft , nondistended, nontender to palpation. No CVA tenderness. Musculoskeletal: No lower extremity tenderness nor edema.  No joint effusions. No signs of acute trauma. Back appears normal without signs of trauma or skin changes/rash.  No spinal step-offs or tenderness throughout. Neurologic:  Normal speech and language. No gross focal neurologic deficits are appreciated. No gait instability noted. Skin:  Skin is warm, dry and intact. No rash noted. Psychiatric: Mood and affect are normal. Speech and behavior are normal.  ____________________________________________   LABS (all labs ordered are listed, but only abnormal results are displayed)  Labs Reviewed - No data to display ____________________________________________  12 Lead EKG   ____________________________________________  RADIOLOGY  ED MD interpretation:    Official radiology report(s): No results found.  ____________________________________________   PROCEDURES and INTERVENTIONS  Procedure(s) performed (including Critical Care):  Procedures  Medications - No  data to display  ____________________________________________   MDM / ED COURSE   86 year old woman presents after resolution of back spasming and anxiety, without evidence of acute pathology.  No signs of trauma and no indications for imaging.  She is a normal examination and normal vital signs.  No barriers to outpatient management.  We will discharge with return precautions      ____________________________________________   FINAL CLINICAL IMPRESSION(S) / ED DIAGNOSES  Final diagnoses:  Acute midline low back pain without sciatica  Muscle spasm  Anxiety     ED Discharge Orders     None        Olufemi Mofield   Note:  This document was prepared using Dragon voice recognition  software and may include unintentional dictation errors.    Delton Prairie, MD 10/17/20 1040

## 2020-10-23 ENCOUNTER — Ambulatory Visit: Payer: Medicare Other | Admitting: Podiatry

## 2020-10-26 ENCOUNTER — Telehealth: Payer: Self-pay

## 2020-10-26 NOTE — Telephone Encounter (Signed)
Spoke to pt. She said she is doing much better 

## 2020-11-07 ENCOUNTER — Other Ambulatory Visit: Payer: Self-pay | Admitting: Internal Medicine

## 2020-11-07 NOTE — Telephone Encounter (Signed)
Last filled 07-11-20 #20 Last OV 06-26-20 Next OV 01-01-21 [facility] [address]. [employer]

## 2020-11-22 ENCOUNTER — Ambulatory Visit (INDEPENDENT_AMBULATORY_CARE_PROVIDER_SITE_OTHER): Payer: Medicare Other | Admitting: Podiatry

## 2020-11-22 ENCOUNTER — Encounter (INDEPENDENT_AMBULATORY_CARE_PROVIDER_SITE_OTHER): Payer: Self-pay

## 2020-11-22 ENCOUNTER — Other Ambulatory Visit: Payer: Self-pay

## 2020-11-22 DIAGNOSIS — M79675 Pain in left toe(s): Secondary | ICD-10-CM

## 2020-11-22 DIAGNOSIS — B351 Tinea unguium: Secondary | ICD-10-CM

## 2020-11-22 DIAGNOSIS — M79674 Pain in right toe(s): Secondary | ICD-10-CM

## 2020-11-28 ENCOUNTER — Encounter: Payer: Self-pay | Admitting: Podiatry

## 2020-11-28 NOTE — Progress Notes (Signed)
  Subjective:  Patient ID: Diana Owen, female    DOB: 03/31/1934,  MRN: [id number]  Chief Complaint  Patient presents with   Nail Problem    Nail trim    86 y.o. female returns for the above complaint.  Patient presents with thickened elongated dystrophic toenails x7.  Patient states painful to touch.  Patient is out of her take over so.  She would like for me to debride them down.  She denies any other acute complaints.    Objective:  There were no vitals filed for this visit. Podiatric Exam: Vascular: dorsalis pedis and posterior tibial pulses are palpable bilateral. Capillary return is immediate. Temperature gradient is WNL. Skin turgor WNL  Sensorium: Normal Semmes Weinstein monofilament test. Normal tactile sensation bilaterally. Nail Exam: Pt has thick disfigured discolored nails with subungual debris noted bilateral entire nail hallux through fifth toenails.  Pain on palpation to the nails. Ulcer Exam: There is no evidence of ulcer or pre-ulcerative changes or infection. Orthopedic Exam: Muscle tone and strength are WNL. No limitations in general ROM. No crepitus or effusions noted. HAV  B/L.  Hammer toes 2-5  B/L. Skin: No Porokeratosis. No infection or ulcers    Assessment & Plan:   1. Pain due to onychomycosis of toenails of both feet      Patient was evaluated and treated and all questions answered.  Onychomycosis with pain  -Nails palliatively debrided as below. -Educated on self-care  Procedure: Nail Debridement Rationale: pain  Type of Debridement: manual, sharp debridement. Instrumentation: Nail nipper, rotary burr. Number of Nails: 10  Procedures and Treatment: Consent by patient was obtained for treatment procedures. The patient understood the discussion of treatment and procedures well. All questions were answered thoroughly reviewed. Debridement of mycotic and hypertrophic toenails, 1 through 5 bilateral and clearing of subungual debris. No ulceration, no  infection noted.  Return Visit-Office Procedure: Patient instructed to return to the office for a follow up visit 3 months for continued evaluation and treatment.  Nicholes Rough, DPM    No follow-ups on file.

## 2020-12-06 DIAGNOSIS — Z23 Encounter for immunization: Secondary | ICD-10-CM | POA: Diagnosis not present

## 2020-12-25 ENCOUNTER — Encounter: Payer: Medicare Other | Admitting: Internal Medicine

## 2020-12-27 DIAGNOSIS — Z23 Encounter for immunization: Secondary | ICD-10-CM | POA: Diagnosis not present

## 2021-01-01 ENCOUNTER — Other Ambulatory Visit: Payer: Self-pay

## 2021-01-01 ENCOUNTER — Ambulatory Visit (INDEPENDENT_AMBULATORY_CARE_PROVIDER_SITE_OTHER): Payer: Medicare Other | Admitting: Internal Medicine

## 2021-01-01 ENCOUNTER — Encounter: Payer: Self-pay | Admitting: Internal Medicine

## 2021-01-01 VITALS — BP 114/76 | HR 80 | Temp 97.4°F | Ht 61.0 in | Wt 125.0 lb

## 2021-01-01 DIAGNOSIS — Z0000 Encounter for general adult medical examination without abnormal findings: Secondary | ICD-10-CM | POA: Diagnosis not present

## 2021-01-01 DIAGNOSIS — E039 Hypothyroidism, unspecified: Secondary | ICD-10-CM

## 2021-01-01 DIAGNOSIS — I700 Atherosclerosis of aorta: Secondary | ICD-10-CM | POA: Diagnosis not present

## 2021-01-01 DIAGNOSIS — M4125 Other idiopathic scoliosis, thoracolumbar region: Secondary | ICD-10-CM

## 2021-01-01 DIAGNOSIS — F39 Unspecified mood [affective] disorder: Secondary | ICD-10-CM

## 2021-01-01 DIAGNOSIS — Z7189 Other specified counseling: Secondary | ICD-10-CM

## 2021-01-01 DIAGNOSIS — R1319 Other dysphagia: Secondary | ICD-10-CM

## 2021-01-01 LAB — COMPREHENSIVE METABOLIC PANEL
ALT: 16 U/L (ref 0–35)
AST: 21 U/L (ref 0–37)
Albumin: 4.1 g/dL (ref 3.5–5.2)
Alkaline Phosphatase: 68 U/L (ref 39–117)
BUN: 16 mg/dL (ref 6–23)
CO2: 32 mEq/L (ref 19–32)
Calcium: 9.7 mg/dL (ref 8.4–10.5)
Chloride: 101 mEq/L (ref 96–112)
Creatinine, Ser: 0.64 mg/dL (ref 0.40–1.20)
GFR: 81.01 mL/min (ref >60.00)
Glucose, Bld: 79 mg/dL (ref 70–99)
Potassium: 4.2 mEq/L (ref 3.5–5.1)
Sodium: 141 mEq/L (ref 135–145)
Total Bilirubin: 0.7 mg/dL (ref 0.2–1.2)
Total Protein: 6.6 g/dL (ref 6.0–8.3)

## 2021-01-01 LAB — CBC
HCT: 41 % (ref 36.0–46.0)
Hemoglobin: 13.7 g/dL (ref 12.0–15.0)
MCHC: 33.3 g/dL (ref 30.0–36.0)
MCV: 90.1 fl (ref 78.0–100.0)
Platelets: 198 10*3/uL (ref 150.0–400.0)
RBC: 4.55 Mil/uL (ref 3.87–5.11)
RDW: 13.2 % (ref 11.5–15.5)
WBC: 6.6 10*3/uL (ref 4.0–10.5)

## 2021-01-01 LAB — LIPID PANEL
Cholesterol: 232 mg/dL — ABNORMAL HIGH (ref 0–200)
HDL: 64.8 mg/dL (ref >39.00)
LDL Cholesterol: 147 mg/dL — ABNORMAL HIGH (ref 0–99)
NonHDL: 167.03
Total CHOL/HDL Ratio: 4
Triglycerides: 99 mg/dL (ref 0.0–149.0)
VLDL: 19.8 mg/dL (ref 0.0–40.0)

## 2021-01-01 LAB — T4, FREE: Free T4: 1.04 ng/dL (ref 0.60–1.60)

## 2021-01-01 LAB — TSH: TSH: 0.78 u[IU]/mL (ref 0.35–5.50)

## 2021-01-01 MED ORDER — OMEPRAZOLE 20 MG PO CPDR: 20.0000 mg | DELAYED_RELEASE_CAPSULE | ORAL | 0 refills | Status: DC

## 2021-01-01 MED ORDER — ALPRAZOLAM 0.25 MG PO TABS: ORAL_TABLET | ORAL | 0 refills | Status: DC

## 2021-01-01 NOTE — Assessment & Plan Note (Signed)
Discussed adding regular tylenol

## 2021-01-01 NOTE — Assessment & Plan Note (Signed)
Seems to be euthyroid on the levothyroxine Will check labs 

## 2021-01-01 NOTE — Assessment & Plan Note (Signed)
On x-ray Will hold off on statin

## 2021-01-01 NOTE — Progress Notes (Signed)
Subjective:    Patient ID: Diana Owen, female    DOB: 1934-06-26, 86 y.o.   MRN: [id number]  HPI Here for Medicare wellness visit and follow up of chronic health conditions This visit occurred during the SARS-CoV-2 public health emergency.  Safety protocols were in place, including screening questions prior to the visit, additional usage of staff PPE, and extensive cleaning of exam room while observing appropriate contact time as indicated for disinfecting solutions.   Reviewed advanced directives Reviewed other doctors--Dr Patel--podiatry, needs local eye doctor, Dr Maisie Fus No hospitalizations or surgery in the past year (but did have ER visit for back pain after eating fried food) Has occasional wine--1/2 glass No tobacco Vision is not as good---trouble reading Hearing is good No falls Independent with instrumental ADLs Mild memory issues--no functional problems  Having back problems still Scoliosis is source of problem Walks daily 30 minutes--with rollator Did go to gym but really didn't focus on core Uses some heat on it in the morning Some right arm pain also Hasn't been using any medications  Has needed the xanax  Will take 1/2 tab many days---anxiety in the middle of the day Notices "jittery inside"-- 1/2 gets rid of the feeling No persistent depression Not anhedonic  Known aortic atherosclerosis on CXR No statin  Not taking the omeprazole --"I think it is bad for me" Takes OTC indigestion pill after eating Does have some esophageal dysphagia---usually better if she walks around  Current Outpatient Medications on File Prior to Visit  Medication Sig Dispense Refill   ALPRAZolam (XANAX) 0.25 MG tablet TAKE 1/2 TO 1 TABLET(0.125 TO 0.25 MG) BY MOUTH THREE TIMES DAILY AS NEEDED FOR ANXIETY 20 tablet 0   cholecalciferol (VITAMIN D) 1000 units tablet Take 1,000 Units by mouth daily.     Cyanocobalamin (VITAMIN B-12) 6000 MCG SUBL Place 1 tablet under the  tongue daily.     levothyroxine (SYNTHROID) 100 MCG tablet TAKE 1 TABLET(100 MCG) BY MOUTH DAILY 90 tablet 3   Multiple Vitamins-Minerals (MULTIVITAMIN WITH MINERALS) tablet Take 1 tablet by mouth daily.     Omega-3 Fatty Acids (FISH OIL PO) Take by mouth.     omeprazole (PRILOSEC) 20 MG capsule TAKE 1 CAPSULE(20 MG) BY MOUTH DAILY 30 capsule 11   polyethylene glycol (MIRALAX / GLYCOLAX) packet Take 17 g by mouth daily.     SF 5000 PLUS 1.1 % CREA dental cream APPLY A PEA-SIZED AMOUNT TO TOOTHBRUSH IN PLACE OF NORMAL PASTE; ...  (REFER TO PRESCRIPTION NOTES).  0   vitamin E 100 UNIT capsule Take 100 Units by mouth daily.     No current facility-administered medications on file prior to visit.    Allergies  Allergen Reactions   Codeine Nausea And Vomiting   Adhesive [Tape] Rash    Paper tape is okay   Latex Rash    Probably adhesive allergy.  bandaids bother patient    Past Medical History:  Diagnosis Date   Anxiety    Arthritis    finger!!   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 if patient gets up during meal to let food pass, then n & v is not so bad   Midgut volvulus    SBO (small bowel obstruction) (HCC) 11/13/2016   Small bowel obstruction (  HCC)    Small bowel obstruction due to postoperative adhesions 06/29/2017   Thyroid disease     Past Surgical History:  Procedure Laterality Date   APPENDECTOMY  2002   LAPAROTOMY N/A 09/28/2017   Procedure: EXPLORATORY LAPAROTOMY WITH LYSIS OF ADHESIONS;  Surgeon: Ancil Linsey, MD;  Location: [facility] ORS;  Service: General;  Laterality: N/A;   PARTIAL COLECTOMY  2002   large polyp   TONSILLECTOMY     TUBAL LIGATION      Family History  Problem Relation Age of Onset   Leukemia Mother        CLL   Leukemia Father     Hyperlipidemia Brother    Stroke Brother    Heart disease Paternal Uncle    Diabetes Neg Hx     Social History   Socioeconomic History   Marital status: Married    Spouse name: Not on file   Number of children: 3   Years of education: Not on file   Highest education level: Not on file  Occupational History   Occupation: Diplomatic Services operational officer   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    Comment: retired  Tobacco Use   Smoking status: Never   Smokeless tobacco: Never  Vaping Use   Vaping Use: Never used  Substance and Sexual Activity   Alcohol use: Yes    Comment: rare wine    Drug use: No   Sexual activity: Yes  Other Topics Concern   Not on file  Social History Narrative   2 sons, 1 daughter--- Brett Canales in [place] in [place], Selinda Orion in [place]      Has a living will   Daughter [name], then sons, should make health care decisions   Would accept resuscitation attempts   Not sure about tube feeds   Social Determinants of Health   Financial Resource Strain: Not on file  Food Insecurity: Not on file  Transportation Needs: Not on file  Physical Activity: Not on file  Stress: Not on file  Social Connections: Not on file  Intimate Partner Violence: Not on file   Review of Systems Appetite is good Weight is stable Trouble with her teeth---lower partial not fitting well. Going to dentist Has spot on bridge of nose---goes back 2 years. No other skin lesions Wears seat belt Bowels are fine with miralax No dysuria--still with some incontinence No chest pain No SOB No dizziness or syncope No significant edema (slight at end of day only)    Objective:   Physical Exam Constitutional:      Appearance: Normal appearance.  HENT:     Mouth/Throat:     Comments: No lesions Eyes:     Conjunctiva/sclera: Conjunctivae normal.     Pupils: Pupils are equal, round, and reactive to light.  Cardiovascular:     Rate and Rhythm: Normal rate and regular rhythm.     Pulses: Normal pulses.      Heart sounds: No murmur heard.   No gallop.  Pulmonary:     Effort: Pulmonary effort is normal.     Breath sounds: Normal breath sounds. No wheezing or rales.  Abdominal:     Palpations: Abdomen is soft.     Tenderness: There is no abdominal tenderness.  Musculoskeletal:     Cervical back: Neck supple.     Right lower leg: No edema.     Left lower leg: No edema.  Lymphadenopathy:     Cervical: No cervical adenopathy.  Skin:  Comments: Scaly area on bridge of nose  Neurological:     Mental Status: She is alert and oriented to person, place, and time.     Comments: President---"Biden, Trump, dark skinned gentleman" [id number] Recall 3/3  Psychiatric:        Mood and Affect: Mood normal.        Behavior: Behavior normal.           Assessment & Plan:

## 2021-01-01 NOTE — Assessment & Plan Note (Signed)
She remains afraid of PPI due to past doctor telling her it causes memory loss Ongoing concerning symptoms Asked her to take it at least every other day

## 2021-01-01 NOTE — Progress Notes (Signed)
Hearing Screening   500Hz  1000Hz  2000Hz  4000Hz   Right ear 25 25 20 20   Left ear 25 20 20 20    Vision Screening   Right eye Left eye Both eyes  Without correction     With correction 20/25 20/40 20/20

## 2021-01-01 NOTE — Assessment & Plan Note (Signed)
I have personally reviewed the Medicare Annual Wellness questionnaire and have noted 1. The patient's medical and social history 2. Their use of alcohol, tobacco or illicit drugs 3. Their current medications and supplements 4. The patient's functional ability including ADL's, fall risks, home safety risks and hearing or visual             impairment. 5. Diet and physical activities 6. Evidence for depression or mood disorders  The patients weight, height, BMI and visual acuity have been recorded in the chart I have made referrals, counseling and provided education to the patient based review of the above and I have provided the pt with a written personalized care plan for preventive services.  I have provided you with a copy of your personalized plan for preventive services. Please take the time to review along with your updated medication list.  Td if any injury Had bivalent COVID and this year's flu vaccines Discussed exercise---especially resistance No cancer screening due to age

## 2021-01-01 NOTE — Assessment & Plan Note (Signed)
Ongoing episodic anxiety No clear or persistent depression 1/2 xanax most days does help her

## 2021-01-01 NOTE — Assessment & Plan Note (Signed)
See social history 

## 2021-01-01 NOTE — Patient Instructions (Addendum)
Please talk to Diana Owen about working on Designer, fashion/clothing. (And leg strength).  You can take tylenol regularly to see if that helps your back pain---650-1000mg  up to three times a day.  Please restart the omeprazole 20mg  every other day on an empty stomach--that should help prevent the trouble swallowing.  Please set up an appointment with [facility] on [address]---to check the spot on your nose.

## 2021-02-21 ENCOUNTER — Ambulatory Visit: Payer: Medicare Other | Admitting: Podiatry

## 2021-02-28 ENCOUNTER — Telehealth: Payer: Self-pay | Admitting: Internal Medicine

## 2021-02-28 NOTE — Chronic Care Management (AMB) (Signed)
°  Chronic Care Management   Outreach Note  02/28/2021 Name: Takirah Binford MRN: [id number] DOB: 01-Jul-1934  Referred by: Karie Schwalbe, MD Reason for referral : No chief complaint on file.   An unsuccessful telephone outreach was attempted today. The patient was referred to the pharmacist for assistance with care management and care coordination.   Follow Up Plan:   Tatjana Dellinger Upstream Scheduler

## 2021-02-28 NOTE — Progress Notes (Signed)
°  Chronic Care Management   Note  02/28/2021 Name: Diana Owen MRN: [id number] DOB: 01/23/1935  Diana Owen is a 86 y.o. year old female who is a primary care patient of Letvak, Berneda Rose, MD. I reached out to Mayra Reel by phone today in response to a referral sent by Ms. Lester Kinsman PCP, Karie Schwalbe, MD.   [name] was given information about Chronic Care Management services today including:  CCM service includes personalized support from designated clinical staff supervised by her physician, including individualized plan of care and coordination with other care providers 24/7 contact phone numbers for assistance for urgent and routine care needs. Service will only be billed when office clinical staff spend 20 minutes or more in a month to coordinate care. Only one practitioner may furnish and bill the service in a calendar month. The patient may stop CCM services at any time (effective at the end of the month) by phone call to the office staff.   Patient agreed to services and verbal consent obtained.   Follow up plan:   Tatjana Restaurant manager, fast food

## 2021-03-25 DIAGNOSIS — L570 Actinic keratosis: Secondary | ICD-10-CM | POA: Diagnosis not present

## 2021-03-25 DIAGNOSIS — L821 Other seborrheic keratosis: Secondary | ICD-10-CM | POA: Diagnosis not present

## 2021-03-26 ENCOUNTER — Other Ambulatory Visit: Payer: Self-pay | Admitting: Internal Medicine

## 2021-04-09 ENCOUNTER — Other Ambulatory Visit: Payer: Self-pay

## 2021-04-09 ENCOUNTER — Ambulatory Visit (INDEPENDENT_AMBULATORY_CARE_PROVIDER_SITE_OTHER): Payer: Medicare Other | Admitting: Podiatry

## 2021-04-09 ENCOUNTER — Encounter: Payer: Self-pay | Admitting: Podiatry

## 2021-04-09 DIAGNOSIS — M79675 Pain in left toe(s): Secondary | ICD-10-CM

## 2021-04-09 DIAGNOSIS — B351 Tinea unguium: Secondary | ICD-10-CM

## 2021-04-09 DIAGNOSIS — M79674 Pain in right toe(s): Secondary | ICD-10-CM

## 2021-04-09 NOTE — Progress Notes (Signed)
°  Subjective:  Patient ID: Diana Owen, female    DOB: 10/25/1934,  MRN: [id number]  Chief Complaint  Patient presents with   Nail Problem    Nail trim    86 y.o. female returns for the above complaint.  Patient presents with thickened elongated dystrophic toenails x7.  Patient states painful to touch.  Patient is out of her take over so.  She would like for me to debride them down.  She denies any other acute complaints.    Objective:  There were no vitals filed for this visit. Podiatric Exam: Vascular: dorsalis pedis and posterior tibial pulses are palpable bilateral. Capillary return is immediate. Temperature gradient is WNL. Skin turgor WNL  Sensorium: Normal Semmes Weinstein monofilament test. Normal tactile sensation bilaterally. Nail Exam: Pt has thick disfigured discolored nails with subungual debris noted bilateral entire nail hallux through fifth toenails.  Pain on palpation to the nails. Ulcer Exam: There is no evidence of ulcer or pre-ulcerative changes or infection. Orthopedic Exam: Muscle tone and strength are WNL. No limitations in general ROM. No crepitus or effusions noted. HAV  B/L.  Hammer toes 2-5  B/L. Skin: No Porokeratosis. No infection or ulcers    Assessment & Plan:   1. Pain due to onychomycosis of toenails of both feet      Patient was evaluated and treated and all questions answered.  Onychomycosis with pain  -Nails palliatively debrided as below. -Educated on self-care  Procedure: Nail Debridement Rationale: pain  Type of Debridement: manual, sharp debridement. Instrumentation: Nail nipper, rotary burr. Number of Nails: 10  Procedures and Treatment: Consent by patient was obtained for treatment procedures. The patient understood the discussion of treatment and procedures well. All questions were answered thoroughly reviewed. Debridement of mycotic and hypertrophic toenails, 1 through 5 bilateral and clearing of subungual debris. No ulceration, no  infection noted.  Return Visit-Office Procedure: Patient instructed to return to the office for a follow up visit 3 months for continued evaluation and treatment.  Zakaree Mcclenahan, DPM    No follow-ups on file.  

## 2021-04-17 ENCOUNTER — Telehealth: Payer: Self-pay

## 2021-04-17 NOTE — Chronic Care Management (AMB) (Signed)
Chronic Care Management Pharmacy Assistant   Name: Diana Owen  MRN: [id number] DOB: [birth year]  Diana Owen Risk is an 87 y.o. year old female who presents for his initial CCM visit with the clinical pharmacist.  Reason for Encounter: Initial Questions   Conditions to be addressed/monitored: HLD   Recent office visits:  01/01/21-PCP-Richard Letvak,MD-Patient presented for AWV. Labs ordered( labs look good,Cholesterol mildly elevated) discussed vaccines, screenings, Restart Omeprazole every other day,make appointment with [facility],try tylenol 650-1000mg   up to 3 times a day for back pain.  Recent consult visits:  04/09/21-Podiatry-Kevin Patel,DPM-patient presented for toenail trimming. 11/22/20-Podiatry-Kevin Patel,DPM- Patient presented for toenail trimming.No medication changes 10/17/20-[facility] ED- Dylan Smith,MD-Patient presented for back pain.Observation and discharge to home, no medication changes  Hospital visits:  None in previous 6 months  Medications: Outpatient Encounter Medications as of 04/17/2021  Medication Sig   ALPRAZolam (XANAX) 0.25 MG tablet TAKE 1/2 TO 1 TABLET(0.125 TO 0.25 MG) BY MOUTH THREE TIMES DAILY AS NEEDED FOR ANXIETY   cholecalciferol (VITAMIN D) 1000 units tablet Take 1,000 Units by mouth daily.   Cyanocobalamin (VITAMIN B-12) 6000 MCG SUBL Place 1 tablet under the tongue daily.   levothyroxine (SYNTHROID) 100 MCG tablet TAKE 1 TABLET(100 MCG) BY MOUTH DAILY   Multiple Vitamins-Minerals (MULTIVITAMIN WITH MINERALS) tablet Take 1 tablet by mouth daily.   Omega-3 Fatty Acids (FISH OIL PO) Take by mouth.   omeprazole (PRILOSEC) 20 MG capsule Take 1 capsule (20 mg total) by mouth every other day.   polyethylene glycol (MIRALAX / GLYCOLAX) packet Take 17 g by mouth daily.   SF 5000 PLUS 1.1 % CREA dental cream APPLY A PEA-SIZED AMOUNT TO TOOTHBRUSH IN PLACE OF NORMAL PASTE; ...  (REFER TO PRESCRIPTION NOTES).   vitamin E 100 UNIT capsule Take 100 Units by  mouth daily.   No facility-administered encounter medications on file as of 04/17/2021.     No results found for: HGBA1C, MICROALBUR   BP Readings from Last 3 Encounters:  01/01/21 114/76  10/17/20 131/62  06/26/20 118/68    Patient contacted to review initial questions prior to visit with Al Corpus.  Have you seen any other providers since your last visit with PCP? Yes Podiatry  Any changes in your medications or health? No  Any side effects from any medications? No  Do you have an symptoms or problems not managed by your medications? No  Any concerns about your health right now?  The patient reports she noticed this morning her big toe is hurting some and a little pink this morning- afraid she may have the gout.  Has your provider asked that you check blood pressure, blood sugar, or follow special diet at home? No  Do you get any type of exercise on a regular basis? Yes  Walks every day for exercise   Can you think of a goal you would like to reach for your health? No  Do you have any problems getting your medications? No Uses [facility] [address] [place]  Is there anything that you would like to discuss during the appointment? Yes The patient reports she has a list to discuss   Spoke with patient and reminded them to have all medications, supplements and any blood glucose and blood pressure readings available for review with pharmacist, at their telephone visit on 04/18/21 at 9:00am.   Star Rating Drugs:  Medication:  Last Fill: Day Supply  No star meds identified  Care Gaps: Annual wellness visit in last year? Yes  Most Recent BP reading:114/76  80-P   Charlene Brooke, RPH,CPP   Avel Sensor, [place] Assistant [phone]  Total time spent for month CPA: 40 min

## 2021-04-18 ENCOUNTER — Telehealth: Payer: Self-pay | Admitting: Pharmacist

## 2021-04-18 ENCOUNTER — Other Ambulatory Visit: Payer: Self-pay

## 2021-04-18 ENCOUNTER — Ambulatory Visit (INDEPENDENT_AMBULATORY_CARE_PROVIDER_SITE_OTHER): Payer: Medicare Other | Admitting: Pharmacist

## 2021-04-18 DIAGNOSIS — E039 Hypothyroidism, unspecified: Secondary | ICD-10-CM

## 2021-04-18 DIAGNOSIS — R1319 Other dysphagia: Secondary | ICD-10-CM

## 2021-04-18 DIAGNOSIS — F39 Unspecified mood [affective] disorder: Secondary | ICD-10-CM

## 2021-04-18 DIAGNOSIS — I700 Atherosclerosis of aorta: Secondary | ICD-10-CM

## 2021-04-18 MED ORDER — ESCITALOPRAM OXALATE 5 MG PO TABS: 5.0000 mg | ORAL_TABLET | Freq: Every day | ORAL | 1 refills | Status: DC

## 2021-04-18 NOTE — Patient Instructions (Signed)
Visit Information  Phone number for Pharmacist: [phone]  Thank you for meeting with me to discuss your medications! I look forward to working with you to achieve your health care goals. Below is a summary of what we talked about during the visit:   Goals Addressed             This Visit's Progress    Manage My Medicine       Timeframe:  Long-Range Goal Priority:  Medium Start Date:   04/18/21                          Expected End Date:  04/18/22                      Follow Up Date March 2023   - call for medicine refill 2 or 3 days before it runs out - call if I am sick and can't take my medicine - keep a list of all the medicines I take; vitamins and herbals too - use a pillbox to sort medicine  -Resume taking levothyroxine with breakfast (as previous)   Why is this important?   These steps will help you keep on track with your medicines.   Notes:         Care Plan : [place]  Updates made by Charlton Haws, RPH since 04/18/2021 12:00 AM     Problem: Hyperlipidemia, GERD, Hypothyroidism, and Anxiety   Priority: High     Long-Range Goal: Disease mgmt   Start Date: 04/18/2021  Expected End Date: 04/18/2022  This Visit's Progress: On track  Priority: High  Note:   Current Barriers:  Unable to independently monitor therapeutic efficacy Suboptimal therapeutic regimen for anxiety  Pharmacist Clinical Goal(s):  Patient will achieve adherence to monitoring guidelines and medication adherence to achieve therapeutic efficacy adhere to plan to optimize therapeutic regimen for anxiety as evidenced by report of adherence to recommended medication management changes through collaboration with PharmD and provider.   Interventions: 1:1 collaboration with Venia Carbon, MD regarding development and update of comprehensive plan of care as evidenced by provider attestation and co-signature Inter-disciplinary care team collaboration (see longitudinal plan  of care) Comprehensive medication review performed; medication list updated in electronic medical record  Hyperlipidemia: (LDL goal < 130) -Not ideally controlled - LDL is elevated w/ hx of aortic atherosclerosis on X-ray. Holding off on statin per PCP. -Current treatment: OTC Omega-3 fish oil - Appropriate, Query Effective, Safe, Accessible -Medications previously tried: none  -Educated on Cholesterol goals; Importance of limiting foods high in cholesterol; -Counseled on diet and exercise extensively  Anxiety (Goal: manage symptoms) -Not ideally controlled - pt takes 1/2 tab when needed, not every day; she reports alprazolam helps with indigestion issues; discussed link between stress/anxiety and GI issues -Current treatment: Alprazolam 0.25 mg 1/2-1 tab TID prn - Appropriate, Effective, Query Safe, Accessible -Medications previously tried/failed: none -Discussed option to try SSRI for frequent anxiety/stress that is contributing to GI issues; ideally pt may be able to cut back on alprazolam use -Recommend trial of escitalopram 5 mg daily  Hypothyroidism (Goal: maintain TSH in goal range) -Not ideally controlled - pt c/o hot flashes that are getting worse. She recently switched to taking levothyroxine 30 min before breakfast whereas before she was taking w/ breakfast (one of her friends told her to take it this way); most recent TSH was 0.78 (12/2020) when she was taking levothyroxine  with breakfast -Current treatment  Levothyroxine 100 mcg daily - Appropriate, Query Effective, Safe, Accessible -Discussed levothyroxine is absorbed better on an empty stomach, however in her case this is not necessarily ideal as her most recent TSH was borderline hyperthyroid; hot flashes may be stemming from increasing thyroid activity due to improved absorption of levothyroxine on empty stomach -Advised pt to resume levothyroxine with breakfast as she was previously taking; may require repeat TSH if symptoms  do not improve in 4-6 weeks  GERD (Goal: manage symptoms) -Not ideally controlled - -pt reports food "piles up" when she is eating; she reports symptoms are improved by alprazolam - discussed link between anxiety and GI issues -Pt previously stopped PPI d/t previous doctor warning of memory loss -Current treatment  Omeprazole 20 mg QOD - Appropriate, Effective, Query Safe, Accessible Gas relief -chronic PPI use is associated with decreased bone density; some studies have linked it to dementia as well, although consensus is unclear;  -Recommended to continue current medication - see Anxiety section above, attempt to treat underlying anxiety to see if GI sx improve  Osteopenia (Goal prevent fractures) -Controlled -Last DEXA Scan: 03/26/16   T-Score femoral neck: -1.0  T-Score total hip: -1.0  T-Score lumbar spine: +0.5 -Patient is not a candidate for pharmacologic treatment -Current treatment  Vitamin D 1000 IU daily -Recommend weight-bearing and muscle strengthening exercises for building and maintaining bone density.  Health Maintenance -Vaccine gaps: Flu, covid booster, TDAP, Shingrix Covid boosters: 12/06/20, 08/02/20 per patient vaccine card Flu shot: [place] in Fall 2022 per patient report -Current therapy:  Vitamin B12 6000 mcg Multivitamin Miralax Vitamin E 100 unit -Patient is satisfied with current therapy and denies issues -Recommended to continue current medication  Patient Goals/Self-Care Activities Patient will:  - take medications as prescribed as evidenced by patient report and record review focus on medication adherence by pill box -Resume taking levothyroxine with breakfast (as previous)      Ms. Arista was given information about Chronic Care Management services today including:  CCM service includes personalized support from designated clinical staff supervised by her physician, including individualized plan of care and coordination with other care  providers 24/7 contact phone numbers for assistance for urgent and routine care needs. Standard insurance, coinsurance, copays and deductibles apply for chronic care management only during months in which we provide at least 20 minutes of these services. Most insurances cover these services at 100%, however patients may be responsible for any copay, coinsurance and/or deductible if applicable. This service may help you avoid the need for more expensive face-to-face services. Only one practitioner may furnish and bill the service in a calendar month. The patient may stop CCM services at any time (effective at the end of the month) by phone call to the office staff.  Patient agreed to services and verbal consent obtained.   Patient verbalizes understanding of instructions and care plan provided today and agrees to view in [place]. Active MyChart status confirmed with patient.   Telephone follow up appointment with pharmacy team member scheduled for: 1 month  Charlene Brooke, PharmD, [facility] Clinical Pharmacist [facility] [phone]

## 2021-04-18 NOTE — Telephone Encounter (Signed)
Spoke to pt. She is willing to try the Lexapro. Sent it in and made appt for 05-30-21.

## 2021-04-18 NOTE — Progress Notes (Signed)
Chronic Care Management Pharmacy Note  04/18/2021 Name:  Diana Owen MRN:  [id number] DOB:  1934/09/07  Summary: -Pt reports indigestion, food "piles up" when she is eating; she thinks this is related to stress/anxiety because alprazolam helps; she is open to starting a daily pill for stress/anxiety -Pt reports occasional hot flashes, she thinks also may be related to stress/indigestion issues. Plus she recently changed how she takes levothyroxine - previously took this with breakfast, now takes 30 min before breakfast. Last TSH was 0.78 when she was taking it with breakfast. Increased absorption of levothyroxine on empty stomach may lead to borderline hyperthyroidism and may be contributing to hot flashes  Recommendations/Changes made from today's visit: -Recommend escitalopram 5 mg daily; trial 4-6 weeks (consulting with PCP) -Advised pt to resume taking levothyroxine with breakfast as before when TSH was at goal; may need repeat TSH if hot flashes do not improve  Plan: -Pharmacist follow up televisit scheduled for 1 month -PCP annual visit 01/01/22   Subjective: Diana Owen is an 87 y.o. year old female who is a primary patient of Venia Carbon, MD.  The CCM team was consulted for assistance with disease management and care coordination needs.    Engaged with patient by telephone for initial visit in response to provider referral for pharmacy case management and/or care coordination services.   Consent to Services:  The patient was given the following information about Chronic Care Management services today, agreed to services, and gave verbal consent: 1. CCM service includes personalized support from designated clinical staff supervised by the primary care provider, including individualized plan of care and coordination with other care providers 2. 24/7 contact phone numbers for assistance for urgent and routine care needs. 3. Service will only be billed when office clinical staff  spend 20 minutes or more in a month to coordinate care. 4. Only one practitioner may furnish and bill the service in a calendar month. 5.The patient may stop CCM services at any time (effective at the end of the month) by phone call to the office staff. 6. The patient will be responsible for cost sharing (co-pay) of up to 20% of the service fee (after annual deductible is met). Patient agreed to services and consent obtained.  Patient Care Team: Venia Carbon, MD as PCP - General (Internal Medicine) Charlton Haws, [facility] as Pharmacist (Pharmacist)  Recent office visits: 01/01/21-PCP-Richard Letvak,MD-Patient presented for AWV. Labs ordered( labs look good,Cholesterol mildly elevated) discussed vaccines, screenings, Restart Omeprazole every other day,make appointment with  skin,try tylenol [phone]  up to 3 times a day for back pain.  Recent consult visits: 04/09/21-Podiatry-Kevin Patel,DPM-patient presented for toenail trimming. 11/22/20-Podiatry-Kevin Patel,DPM- Patient presented for toenail trimming.No medication changes  Hospital visits: 10/17/20-[facility] ED- Dylan Smith,MD-Patient presented for back pain.Observation and discharge to home, no medication changes   Objective:  Lab Results  Component Value Date   CREATININE 0.64 01/01/2021   BUN 16 01/01/2021   GFR 81.01 01/01/2021   GFRNONAA >60 09/30/2017   GFRAA >60 09/30/2017   NA 141 01/01/2021   K 4.2 01/01/2021   CALCIUM 9.7 01/01/2021   CO2 32 01/01/2021   GLUCOSE 79 01/01/2021    Lab Results  Component Value Date/Time   GFR 81.01 01/01/2021 11:47 AM   GFR 82.51 12/16/2019 11:32 AM    Last diabetic Eye exam: No results found for: HMDIABEYEEXA  Last diabetic Foot exam: No results found for: HMDIABFOOTEX   Lab Results  Component Value Date   CHOL  232 (H) 01/01/2021   HDL 64.80 01/01/2021   LDLCALC 147 (H) 01/01/2021   TRIG 99.0 01/01/2021   CHOLHDL 4 01/01/2021    Hepatic Function Latest Ref Rng &  Units 01/01/2021 12/16/2019 12/09/2018  Total Protein 6.0 - 8.3 g/dL 6.6 6.9 6.8  Albumin 3.5 - 5.2 g/dL 4.1 4.3 4.2  AST 0 - 37 U/L _0 ALT 0 - 35 U/L _1 Alk Phosphatase 39 - 117 U/L 68 65 62  Total Bilirubin 0.2 - 1.2 mg/dL 0.7 0.9 0.6    Lab Results  Component Value Date/Time   TSH 0.78 01/01/2021 11:47 AM   TSH 0.43 12/16/2019 11:32 AM   FREET4 1.04 01/01/2021 11:47 AM   FREET4 1.07 12/16/2019 11:32 AM    CBC Latest Ref Rng & Units 01/01/2021 12/16/2019 12/09/2018  WBC 4.0 - 10.5 K/uL 6.6 8.3 7.1  Hemoglobin 12.0 - 15.0 g/dL 13.7 14.1 13.0  Hematocrit 36.0 - 46.0 % 41.0 41.8 38.5  Platelets 150.0 - 400.0 K/uL 198.0 208.0 198.0    No results found for: VD25OH  Clinical ASCVD: Yes  - aortic atherosclerosis The ASCVD Risk score (Arnett DK, et al., 2019) failed to calculate for the following reasons:   The 2019 ASCVD risk score is only valid for ages 12 to 65    Depression screen PHQ 2/9 01/01/2021 01/01/2021 12/09/2018  Decreased Interest 0 0 0  Down, Depressed, Hopeless 1 0 0  PHQ - 2 Score 1 0 0     Social History   Tobacco Use  Smoking Status Never  Smokeless Tobacco Never   BP Readings from Last 3 Encounters:  01/01/21 114/76  10/17/20 131/62  06/26/20 118/68   Pulse Readings from Last 3 Encounters:  01/01/21 80  10/17/20 60  06/26/20 (!) 54   Wt Readings from Last 3 Encounters:  01/01/21 125 lb (56.7 kg)  06/26/20 124 lb (56.2 kg)  12/16/19 124 lb (56.2 kg)   BMI Readings from Last 3 Encounters:  01/01/21 23.62 kg/m  06/26/20 23.43 kg/m  12/16/19 23.82 kg/m    Assessment/Interventions: Review of patient past medical history, allergies, medications, health status, including review of consultants reports, laboratory and other test data, was performed as part of comprehensive evaluation and provision of chronic care management services.   SDOH:  (Social Determinants of Health) assessments and interventions performed: Yes SDOH  Interventions    Flowsheet Row Most Recent Value  SDOH Interventions   Food Insecurity Interventions Intervention Not Indicated  Financial Strain Interventions Intervention Not Indicated      SDOH Screenings   Alcohol Screen: Not on file  Depression (PHQ2-9): Low Risk    PHQ-2 Score: 1  Financial Resource Strain: Low Risk    Difficulty of Paying Living Expenses: Not hard at all  Food Insecurity: No Food Insecurity   Worried About Charity fundraiser in the Last Year: Never true   Arboriculturist in the Last Year: Never true  Housing: Not on file  Physical Activity: Not on file  Social Connections: Not on file  Stress: Not on file  Tobacco Use: Low Risk    Smoking Tobacco Use: Never   Smokeless Tobacco Use: Never   Passive Exposure: Not on file  Transportation Needs: Not on file    CCM Care Plan  Allergies  Allergen Reactions   Codeine Nausea And Vomiting   Adhesive [Tape] Rash    Paper tape is okay   Latex Rash  Probably adhesive allergy.  bandaids bother patient    Medications Reviewed Today     Reviewed by Charlton Haws, [facility] (Pharmacist) on 04/18/21 at 1010  Med List Status: <None>   Medication Order Taking? Sig Documenting Provider Last Dose Status Informant  ALPRAZolam (XANAX) 0.25 MG tablet [id number] Yes TAKE 1/2 TO 1 TABLET(0.125 TO 0.25 MG) BY MOUTH THREE TIMES DAILY AS NEEDED FOR ANXIETY Venia Carbon, MD Taking Active   cholecalciferol (VITAMIN D) 1000 units tablet [id number] Yes Take 1,000 Units by mouth daily. [provider] Taking Active Self  Cyanocobalamin (VITAMIN B-12) 6000 MCG SUBL [id number] Yes Place 1 tablet under the tongue daily. [provider] Taking Active Self  levothyroxine (SYNTHROID) 100 MCG tablet [id number] Yes TAKE 1 TABLET(100 MCG) BY MOUTH DAILY Venia Carbon, MD Taking Active   Multiple Vitamins-Minerals (MULTIVITAMIN WITH MINERALS) tablet [id number] Yes Take 1 tablet by mouth daily. [provider] Taking Active Self  Omega-3 Fatty Acids (FISH OIL PO) [id number] Yes Take by mouth. [provider] Taking Active   omeprazole (PRILOSEC) 20 MG capsule [id number] Yes Take 1 capsule (20 mg total) by mouth every other day. Venia Carbon, MD Taking Active   polyethylene glycol [facility] / GLYCOLAX) packet [id number] Yes Take 17 g by mouth daily. [provider] Taking Active Self  simethicone (MYLICON) 80 MG chewable tablet [id number] Yes Chew 80 mg by mouth every 6 (six) hours as needed for flatulence. [provider] Taking Active   vitamin E 100 UNIT capsule [id number] Yes Take 100 Units by mouth daily. [provider] Taking Active Self            Patient Active Problem List   Diagnosis Date Noted   Mixed stress and urge urinary incontinence 06/26/2020   Scoliosis 06/26/2020   Aortic atherosclerosis ([place]) 12/16/2019   Mood disorder ([place]) 12/09/2018   Left hip pain 10/25/2018   Preventative health care 11/26/2017   Advance directive discussed with patient 11/26/2017   Other idiopathic scoliosis, thoracolumbar region 07/16/2016   Esophageal dysphagia 09/26/2015   Acid reflux 09/04/2015   Hyperlipidemia 09/04/2015   Hypothyroidism 09/04/2015    Immunization History  Administered Date(s) Administered   Influenza Split 12/27/2015   Influenza,inj,Quad PF,6+ Mos 11/21/2016, 11/26/2017   Influenza-Unspecified 11/16/2013, 12/25/2014   Moderna Covid-19 Vaccine Bivalent Booster 25yrs & up 12/06/2020   Moderna Sars-Covid-2 Vaccination 03/29/2019, 04/26/2019, 08/02/2020   Pneumococcal Conjugate-13 12/25/2014   Pneumococcal Polysaccharide-23 11/09/2009, 11/26/2017   Tdap 11/13/2006   Zoster, Live 09/28/2009    Conditions to be addressed/monitored:  Hyperlipidemia, GERD, Hypothyroidism, and Anxiety  Care Plan : [place]  Updates made by Charlton Haws, [place] since 04/18/2021 12:00 AM     Problem: Hyperlipidemia,  GERD, Hypothyroidism, and Anxiety   Priority: High     Long-Range Goal: Disease mgmt   Start Date: 04/18/2021  Expected End Date: 04/18/2022  This Visit's Progress: On track  Priority: High  Note:   Current Barriers:  Unable to independently monitor therapeutic efficacy Suboptimal therapeutic regimen for anxiety  Pharmacist Clinical Goal(s):  Patient will achieve adherence to monitoring guidelines and medication adherence to achieve therapeutic efficacy adhere to plan to optimize therapeutic regimen for anxiety as evidenced by report of adherence to recommended medication management changes through collaboration with PharmD and provider.   Interventions: 1:1 collaboration with Venia Carbon, MD regarding development and update of comprehensive plan of care as evidenced by provider attestation and co-signature Inter-disciplinary care team  collaboration (see longitudinal plan of care) Comprehensive medication review performed; medication list updated in electronic medical record  Hyperlipidemia: (LDL goal < 130) -Not ideally controlled - LDL is elevated w/ hx of aortic atherosclerosis on X-ray. Holding off on statin per PCP. -Current treatment: OTC Omega-3 fish oil - Appropriate, Query Effective, Safe, Accessible -Medications previously tried: none  -Educated on Cholesterol goals; Importance of limiting foods high in cholesterol; -Counseled on diet and exercise extensively  Anxiety (Goal: manage symptoms) -Not ideally controlled - pt takes 1/2 tab when needed, not every day; she reports alprazolam helps with indigestion issues; discussed link between stress/anxiety and GI issues -Current treatment: Alprazolam 0.25 mg 1/2-1 tab TID prn - Appropriate, Effective, Query Safe, Accessible -Medications previously tried/failed: none -Discussed option to try SSRI for frequent anxiety/stress that is contributing to GI issues; ideally pt may be able to cut back on alprazolam use -Recommend  trial of escitalopram 5 mg daily  Hypothyroidism (Goal: maintain TSH in goal range) -Not ideally controlled - pt c/o hot flashes that are getting worse. She recently switched to taking levothyroxine 30 min before breakfast whereas before she was taking w/ breakfast (one of her friends told her to take it this way); most recent TSH was 0.78 (12/2020) when she was taking levothyroxine with breakfast -Current treatment  Levothyroxine 100 mcg daily - Appropriate, Query Effective, Safe, Accessible -Discussed levothyroxine is absorbed better on an empty stomach, however in her case this is not necessarily ideal as her most recent TSH was borderline hyperthyroid; hot flashes may be stemming from increasing thyroid activity due to improved absorption of levothyroxine on empty stomach -Advised pt to resume levothyroxine with breakfast as she was previously taking; may require repeat TSH if symptoms do not improve in 4-6 weeks  GERD (Goal: manage symptoms) -Not ideally controlled - -pt reports food "piles up" when she is eating; she reports symptoms are improved by alprazolam - discussed link between anxiety and GI issues -Pt previously stopped PPI d/t previous doctor warning of memory loss -Current treatment  Omeprazole 20 mg QOD - Appropriate, Effective, Query Safe, Accessible Gas relief -chronic PPI use is associated with decreased bone density; some studies have linked it to dementia as well, although consensus is unclear;  -Recommended to continue current medication - see Anxiety section above, attempt to treat underlying anxiety to see if GI sx improve  Osteopenia (Goal prevent fractures) -Controlled -Last DEXA Scan: 03/26/16   T-Score femoral neck: -1.0  T-Score total hip: -1.0  T-Score lumbar spine: +0.5 -Patient is not a candidate for pharmacologic treatment -Current treatment  Vitamin D 1000 IU daily -Recommend weight-bearing and muscle strengthening exercises for building and maintaining  bone density.  Health Maintenance -Vaccine gaps: Flu, covid booster, TDAP, Shingrix Covid boosters: 12/06/20, 08/02/20 per patient vaccine card Flu shot: [place] in Fall 2022 per patient report -Current therapy:  Vitamin B12 6000 mcg Multivitamin Miralax Vitamin E 100 unit -Patient is satisfied with current therapy and denies issues -Recommended to continue current medication  Patient Goals/Self-Care Activities Patient will:  - take medications as prescribed as evidenced by patient report and record review focus on medication adherence by pill box -Resume taking levothyroxine with breakfast (as previous)      Medication Assistance: None required.  Patient affirms current coverage meets needs.  Compliance/Adherence/Medication fill history: Care Gaps: DEXA - performed 03/26/2016; indicative of osteopenia in hip  Star-Rating Drugs: None  Patient's preferred pharmacy is:  [facility] [place], [place]  [place] AT [place] [address] [place] [address] Phone: [phone] Fax: [phone]  Uses pill box? Yes Pt endorses 100% compliance  We discussed: Current pharmacy is preferred with insurance plan and patient is satisfied with pharmacy services Patient decided to: Continue current medication management strategy  Care Plan and Follow Up Patient Decision:  Patient agrees to Care Plan and Follow-up.  Plan: Telephone follow up appointment with care management team member scheduled for:  1 month  Charlene Brooke, PharmD, [facility] Clinical Pharmacist [facility] [phone]

## 2021-04-18 NOTE — Telephone Encounter (Signed)
Patient reports alprazolam helps her with indigestion issues as well as stress; we discussed link between stress/anxiety and GI distress and potential roll for SSRI to manage anxiety long term. Pt is open to SSRI and would like to try it.   Recommend escitalopram 5 mg daily, trial of 4-6 weeks for benefit.  Forwarding to PCP for approval.

## 2021-04-18 NOTE — Addendum Note (Signed)
Addended by: Eual Fines on: 04/18/2021 04:38 PM   Modules accepted: Orders

## 2021-04-21 ENCOUNTER — Other Ambulatory Visit: Payer: Self-pay | Admitting: Internal Medicine

## 2021-05-06 ENCOUNTER — Other Ambulatory Visit: Payer: Self-pay | Admitting: Internal Medicine

## 2021-05-06 NOTE — Telephone Encounter (Signed)
Last filled 01-01-21 #30 Last OV 01-01-21 Next OV 05-30-21 [facility] [address] [employer] [employer] [employer]

## 2021-05-14 DIAGNOSIS — E039 Hypothyroidism, unspecified: Secondary | ICD-10-CM

## 2021-05-15 ENCOUNTER — Telehealth: Payer: Self-pay

## 2021-05-15 NOTE — Chronic Care Management (AMB) (Addendum)
? ? ?  Chronic Care Management ?Pharmacy Assistant  ? ?Name: Diana Owen  MRN: [id number] DOB: 1934-09-13 ? ? ?Reason for Encounter: Reminder Call ?  ?Conditions to be addressed/monitored: ?HLD ? ? ?Medications: ?Outpatient Encounter Medications as of 05/15/2021  ?Medication Sig  ? ALPRAZolam (XANAX) 0.25 MG tablet TAKE 1/2 TO 1 TABLET(0.125 TO 0.25 MG) BY MOUTH THREE TIMES DAILY AS NEEDED FOR ANXIETY  ? cholecalciferol (VITAMIN D) 1000 units tablet Take 1,000 Units by mouth daily.  ? Cyanocobalamin (VITAMIN B-12) 6000 MCG SUBL Place 1 tablet under the tongue daily.  ? escitalopram (LEXAPRO) 5 MG tablet Take 1 tablet (5 mg total) by mouth daily.  ? levothyroxine (SYNTHROID) 100 MCG tablet TAKE 1 TABLET(100 MCG) BY MOUTH DAILY  ? Multiple Vitamins-Minerals (MULTIVITAMIN WITH MINERALS) tablet Take 1 tablet by mouth daily.  ? Omega-3 Fatty Acids (FISH OIL PO) Take by mouth.  ? omeprazole (PRILOSEC) 20 MG capsule Take 1 capsule (20 mg total) by mouth every other day.  ? polyethylene glycol (MIRALAX / GLYCOLAX) packet Take 17 g by mouth daily.  ? simethicone (MYLICON) 80 MG chewable tablet Chew 80 mg by mouth every 6 (six) hours as needed for flatulence.  ? vitamin E 100 UNIT capsule Take 100 Units by mouth daily.  ? ?No facility-administered encounter medications on file as of 05/15/2021.  ? ? ?Artisha Capri was contacted to remind of upcoming telephone visit with Al Corpus on 05/16/21 at 8:45am. Patient was reminded to have all medications, supplements and any blood glucose and blood pressure readings available for review at appointment. If unable to reach, a voicemail was left for patient.  ? ? ?Are you having any problems with your medications? The patient states she is having some quivering of her lips and wonders if this is related to the Lexapro that Dr.Letvak just prescrbed.  ? ?Do you have any concerns you like to discuss with the pharmacist? No ? ? ?Star Rating Drugs: ?Medication:  Last Fill: Day Supply ?No star  medications identified ? ?Al Corpus, [facility]  CPP notified ? ?Latesia Norrington, CCMA ?Clincal Pharmacy Assistant ?[phone] ? ?Total time spent for month ?CPA: 10 min ?

## 2021-05-16 ENCOUNTER — Other Ambulatory Visit: Payer: Self-pay

## 2021-05-16 ENCOUNTER — Ambulatory Visit (INDEPENDENT_AMBULATORY_CARE_PROVIDER_SITE_OTHER): Payer: Medicare Other | Admitting: Pharmacist

## 2021-05-16 DIAGNOSIS — F39 Unspecified mood [affective] disorder: Secondary | ICD-10-CM

## 2021-05-16 DIAGNOSIS — R1319 Other dysphagia: Secondary | ICD-10-CM

## 2021-05-16 DIAGNOSIS — E039 Hypothyroidism, unspecified: Secondary | ICD-10-CM

## 2021-05-16 NOTE — Progress Notes (Signed)
Chronic Care Management Pharmacy Note  05/17/2021 Name:  Diana Owen MRN:  [id number] DOB:  01-23-1935  Summary: CCM F/U visit -Pt reports improvement in stomach upset/hot flashes since starting escitalopram 5 mg last month; she does report a strange tingling/jittery sensation in her lips since starting the medication; she also states she has missed a few doses and reports the sensation still happens on days she does not take escitalopram  Recommendations/Changes made from today's visit: -Recommend to take escitalopram every other day and monitor lip sensation; pt has f/u with PCP in 2 weeks  Plan: -[facility] will call patient 3 months for general adherence review -Pharmacist follow up televisit scheduled for 6 months -PCP F/U 05/30/21   Subjective: Diana Owen is an 87 y.o. year old female who is a primary patient of Venia Carbon, MD.  The CCM team was consulted for assistance with disease management and care coordination needs.    Engaged with patient by telephone for follow up visit in response to provider referral for pharmacy case management and/or care coordination services.   Consent to Services:  The patient was given information about Chronic Care Management services, agreed to services, and gave verbal consent prior to initiation of services.  Please see initial visit note for detailed documentation.   Patient Care Team: Venia Carbon, MD as PCP - General (Internal Medicine) Charlton Haws, [facility] as Pharmacist (Pharmacist)  Recent office visits: 01/01/21-PCP-Richard Letvak,MD-Patient presented for AWV. Labs ordered( labs look good,Cholesterol mildly elevated) discussed vaccines, screenings, Restart Omeprazole every other day,make appointment with [facility],try tylenol [phone]  up to 3 times a day for back pain.  Recent consult visits: 04/09/21-Podiatry-Kevin Patel,DPM-patient presented for toenail trimming. 11/22/20-Podiatry-Kevin Patel,DPM-  Patient presented for toenail trimming.No medication changes  Hospital visits: 10/17/20-[facility] ED- Dylan Smith,MD-Patient presented for back pain.Observation and discharge to home, no medication changes   Objective:  Lab Results  Component Value Date   CREATININE 0.64 01/01/2021   BUN 16 01/01/2021   GFR 81.01 01/01/2021   GFRNONAA >60 09/30/2017   GFRAA >60 09/30/2017   NA 141 01/01/2021   K 4.2 01/01/2021   CALCIUM 9.7 01/01/2021   CO2 32 01/01/2021   GLUCOSE 79 01/01/2021    Lab Results  Component Value Date/Time   GFR 81.01 01/01/2021 11:47 AM   GFR 82.51 12/16/2019 11:32 AM    Last diabetic Eye exam: No results found for: HMDIABEYEEXA  Last diabetic Foot exam: No results found for: HMDIABFOOTEX   Lab Results  Component Value Date   CHOL 232 (H) 01/01/2021   HDL 64.80 01/01/2021   LDLCALC 147 (H) 01/01/2021   TRIG 99.0 01/01/2021   CHOLHDL 4 01/01/2021    Hepatic Function Latest Ref Rng & Units 01/01/2021 12/16/2019 12/09/2018  Total Protein 6.0 - 8.3 g/dL 6.6 6.9 6.8  Albumin 3.5 - 5.2 g/dL 4.1 4.3 4.2  AST 0 - 37 U/L _0 ALT 0 - 35 U/L _1 Alk Phosphatase 39 - 117 U/L 68 65 62  Total Bilirubin 0.2 - 1.2 mg/dL 0.7 0.9 0.6    Lab Results  Component Value Date/Time   TSH 0.78 01/01/2021 11:47 AM   TSH 0.43 12/16/2019 11:32 AM   FREET4 1.04 01/01/2021 11:47 AM   FREET4 1.07 12/16/2019 11:32 AM    CBC Latest Ref Rng & Units 01/01/2021 12/16/2019 12/09/2018  WBC 4.0 - 10.5 K/uL 6.6 8.3 7.1  Hemoglobin 12.0 - 15.0 g/dL 13.7 14.1 13.0  Hematocrit 36.0 - 46.0 % 41.0 41.8 38.5  Platelets 150.0 - 400.0 K/uL 198.0 208.0 198.0    No results found for: VD25OH  Clinical ASCVD: Yes  - aortic atherosclerosis The ASCVD Risk score (Arnett DK, et al., 2019) failed to calculate for the following reasons:   The 2019 ASCVD risk score is only valid for ages 51 to 63    Depression screen PHQ 2/9 01/01/2021 01/01/2021 12/09/2018  Decreased Interest 0 0 0  Down,  Depressed, Hopeless 1 0 0  PHQ - 2 Score 1 0 0     Social History   Tobacco Use  Smoking Status Never  Smokeless Tobacco Never   BP Readings from Last 3 Encounters:  01/01/21 114/76  10/17/20 131/62  06/26/20 118/68   Pulse Readings from Last 3 Encounters:  01/01/21 80  10/17/20 60  06/26/20 (!) 54   Wt Readings from Last 3 Encounters:  01/01/21 125 lb (56.7 kg)  06/26/20 124 lb (56.2 kg)  12/16/19 124 lb (56.2 kg)   BMI Readings from Last 3 Encounters:  01/01/21 23.62 kg/m  06/26/20 23.43 kg/m  12/16/19 23.82 kg/m    Assessment/Interventions: Review of patient past medical history, allergies, medications, health status, including review of consultants reports, laboratory and other test data, was performed as part of comprehensive evaluation and provision of chronic care management services.   SDOH:  (Social Determinants of Health) assessments and interventions performed: Yes   SDOH Screenings   Alcohol Screen: Not on file  Depression (PHQ2-9): Low Risk    PHQ-2 Score: 1  Financial Resource Strain: Low Risk    Difficulty of Paying Living Expenses: Not hard at all  Food Insecurity: No Food Insecurity   Worried About Charity fundraiser in the Last Year: Never true   Ran Out of Food in the Last Year: Never true  Housing: Not on file  Physical Activity: Not on file  Social Connections: Not on file  Stress: Not on file  Tobacco Use: Low Risk    Smoking Tobacco Use: Never   Smokeless Tobacco Use: Never   Passive Exposure: Not on file  Transportation Needs: Not on file    CCM Care Plan  Allergies  Allergen Reactions   Codeine Nausea And Vomiting   Adhesive [Tape] Rash    Paper tape is okay   Latex Rash    Probably adhesive allergy.  bandaids bother patient    Medications Reviewed Today     Reviewed by Charlton Haws, [facility] (Pharmacist) on 05/17/21 at 1613  Med List Status: <None>   Medication Order Taking? Sig Documenting Provider Last Dose  Status Informant  ALPRAZolam (XANAX) 0.25 MG tablet [id number] Yes TAKE 1/2 TO 1 TABLET(0.125 TO 0.25 MG) BY MOUTH THREE TIMES DAILY AS NEEDED FOR ANXIETY Venia Carbon, MD Taking Active   cholecalciferol (VITAMIN D) 1000 units tablet [id number] Yes Take 1,000 Units by mouth daily. [provider] Taking Active Self  Cyanocobalamin (VITAMIN B-12) 6000 MCG SUBL [id number] Yes Place 1 tablet under the tongue daily. [provider] Taking Active Self  escitalopram (LEXAPRO) 5 MG tablet [id number] Yes Take 1 tablet (5 mg total) by mouth daily. Venia Carbon, MD Taking Active   levothyroxine (SYNTHROID) 100 MCG tablet [id number] Yes TAKE 1 TABLET(100 MCG) BY MOUTH DAILY Venia Carbon, MD Taking Active   Multiple Vitamins-Minerals (MULTIVITAMIN WITH MINERALS) tablet [id number] Yes Take 1 tablet by mouth daily. [provider] Taking Active Self  Omega-3 Fatty Acids ([place]  OIL PO) [id number] Yes Take by mouth. [provider] Taking Active   omeprazole (PRILOSEC) 20 MG capsule [id number] Yes Take 1 capsule (20 mg total) by mouth every other day. Venia Carbon, MD Taking Active   polyethylene glycol [facility] / GLYCOLAX) packet [id number] Yes Take 17 g by mouth daily. [provider] Taking Active Self  simethicone (MYLICON) 80 MG chewable tablet [id number] Yes Chew 80 mg by mouth every 6 (six) hours as needed for flatulence. [provider] Taking Active   vitamin E 100 UNIT capsule [id number] Yes Take 100 Units by mouth daily. [provider] Taking Active Self            Patient Active Problem List   Diagnosis Date Noted   Mixed stress and urge urinary incontinence 06/26/2020   Scoliosis 06/26/2020   Aortic atherosclerosis ([place]) 12/16/2019   Mood disorder ([place]) 12/09/2018   Left hip pain 10/25/2018   Preventative health care 11/26/2017   Advance directive discussed with patient 11/26/2017   Other idiopathic scoliosis,  thoracolumbar region 07/16/2016   Esophageal dysphagia 09/26/2015   Acid reflux 09/04/2015   Hyperlipidemia 09/04/2015   Hypothyroidism 09/04/2015    Immunization History  Administered Date(s) Administered   Influenza Split 12/27/2015   Influenza,inj,Quad PF,6+ Mos 11/21/2016, 11/26/2017   Influenza-Unspecified 11/16/2013, 12/25/2014   Moderna Covid-19 Vaccine Bivalent Booster 69yrs & up 12/06/2020   Moderna Sars-Covid-2 Vaccination 03/29/2019, 04/26/2019, 08/02/2020   Pneumococcal Conjugate-13 12/25/2014   Pneumococcal Polysaccharide-23 11/09/2009, 11/26/2017   Tdap 11/13/2006   Zoster, Live 09/28/2009    Conditions to be addressed/monitored:  Hyperlipidemia, GERD, Hypothyroidism, and Anxiety  Care Plan : [place]  Updates made by Charlton Haws, [place] since 05/17/2021 12:00 AM     Problem: Hyperlipidemia, GERD, Hypothyroidism, and Anxiety   Priority: High     Long-Range Goal: Disease mgmt   Start Date: 04/18/2021  Expected End Date: 04/18/2022  This Visit's Progress: On track  Recent Progress: On track  Priority: High  Note:   Current Barriers:  Unable to independently monitor therapeutic efficacy Suboptimal therapeutic regimen for anxiety  Pharmacist Clinical Goal(s):  Patient will achieve adherence to monitoring guidelines and medication adherence to achieve therapeutic efficacy adhere to plan to optimize therapeutic regimen for anxiety as evidenced by report of adherence to recommended medication management changes through collaboration with PharmD and provider.   Interventions: 1:1 collaboration with Venia Carbon, MD regarding development and update of comprehensive plan of care as evidenced by provider attestation and co-signature Inter-disciplinary care team collaboration (see longitudinal plan of care) Comprehensive medication review performed; medication list updated in electronic medical record  Hyperlipidemia: (LDL goal < 130) -Not  ideally controlled - LDL is elevated w/ hx of aortic atherosclerosis on X-ray. Holding off on statin per PCP. -Current treatment: OTC Omega-3 fish oil - Appropriate, Query Effective, Safe, Accessible -Medications previously tried: none  -Educated on Cholesterol goals; Importance of limiting foods high in cholesterol; -Counseled on diet and exercise extensively  Anxiety (Goal: manage symptoms) -Improving - pt reports improvement in stress-related GI distress and hot flashes since starting escitalopram last month; she also reports a new strange tingling/twitching sensation in her lips; it is not clear if this is related to escitalopram, as she has skipped dose a few days and tingling happens regardless - pt takes xanax 1/2 tab when needed, not every day; she reports alprazolam helps with indigestion issues; discussed link between stress/anxiety and GI issues -Current treatment: Alprazolam 0.25 mg 1/2-1  tab TID prn - Appropriate, Effective, Query Safe, Accessible Escitalopram 5 mg daily - Appropriate, Effective, Safe, Accessible -Medications previously tried/failed: none -Discussed option to try SSRI for frequent anxiety/stress that is contributing to GI issues; ideally pt may be able to cut back on alprazolam use -Advised to take escitalopram every other day, monitor lip tingling, and let PCP know result at f/u visit in 2 weeks  Hypothyroidism (Goal: maintain TSH in goal range) -Improving - pt c/o hot flashes that are getting worse. She recently switched to taking levothyroxine 30 min before breakfast whereas before she was taking w/ breakfast (one of her friends told her to take it this way); most recent TSH was 0.78 (12/2020) when she was taking levothyroxine with breakfast -Update 05/17/21: pt has moved levothyroxine back to taking w/ breakfast, she reports improvement in hot flashes (also benefit of Lexapro) -Current treatment  Levothyroxine 100 mcg daily - Appropriate, Effective, Safe,  Accessible -Discussed levothyroxine is absorbed better on an empty stomach, however in her case this is not necessarily ideal as her most recent TSH was borderline hyperthyroid; hot flashes may be stemming from increasing thyroid activity due to improved absorption of levothyroxine on empty stomach -Recommend to continue current medication  GERD (Goal: manage symptoms) -Improved -pt reports food "piles up" when she is eating; she reports symptoms are improved by alprazolam - discussed link between anxiety and GI issues -Pt previously stopped PPI d/t previous doctor warning of memory loss -05/16/21 update: pt reports improvement in reflux since starting lexapro -Current treatment  Omeprazole 20 mg QOD - Appropriate, Effective, Query Safe, Accessible Gas relief -chronic PPI use is associated with decreased bone density; some studies have linked it to dementia as well, although consensus is unclear;  -Recommended to continue current medication   Osteopenia (Goal prevent fractures) -Controlled -Last DEXA Scan: 03/26/16   T-Score femoral neck: -1.0  T-Score total hip: -1.0  T-Score lumbar spine: +0.5 -Patient is not a candidate for pharmacologic treatment -Current treatment  Vitamin D 1000 IU daily -Recommend weight-bearing and muscle strengthening exercises for building and maintaining bone density.  Health Maintenance -Vaccine gaps: Flu, covid booster, TDAP, Shingrix Covid boosters: 12/06/20, 08/02/20 per patient vaccine card Flu shot: [facility] [place] in Fall 2022 per patient report  Patient Goals/Self-Care Activities Patient will:  - take medications as prescribed as evidenced by patient report and record review -focus on medication adherence by pill box -Resume taking levothyroxine with breakfast (as previous)       Medication Assistance: None required.  Patient affirms current coverage meets needs.  Compliance/Adherence/Medication fill history: Care Gaps: DEXA - performed 03/26/2016;  indicative of osteopenia in hip  Star-Rating Drugs: None  Patient's preferred pharmacy is:  [facility] [place], [place] - [address] [place] [address] Phone: [phone] Fax: [phone]  Uses pill box? Yes Pt endorses 100% compliance  We discussed: Current pharmacy is preferred with insurance plan and patient is satisfied with pharmacy services Patient decided to: Continue current medication management strategy  Care Plan and Follow Up Patient Decision:  Patient agrees to Care Plan and Follow-up.  Plan: Telephone follow up appointment with care management team member scheduled for:  3 months  Charlene Brooke, PharmD, BCACP Clinical Pharmacist [facility] at [facility] [phone]

## 2021-05-17 NOTE — Patient Instructions (Signed)
Visit Information ? ?Phone number for Pharmacist: [phone] ? ? Goals Addressed   ? ?  ?  ?  ?  ? This Visit's Progress  ?  Manage My Medicine     ?  Timeframe:  Long-Range Goal ?Priority:  Medium ?Start Date:   04/18/21                          ?Expected End Date:  04/18/22                     ? ?Follow Up Date June 2023 ?  ?- call for medicine refill 2 or 3 days before it runs out ?- call if I am sick and can't take my medicine ?- keep a list of all the medicines I take; vitamins and herbals too ?- use a pillbox to sort medicine  ?-Resume taking levothyroxine with breakfast (as previous) ?  ?Why is this important?   ?These steps will help you keep on track with your medicines. ?  ?Notes:  ?  ? ?  ? ? ?Care Plan : [place]  ?Updates made by Charlton Haws, RPH since 05/17/2021 12:00 AM  ?  ? ?Problem: Hyperlipidemia, GERD, Hypothyroidism, and Anxiety   ?Priority: High  ?  ? ?Long-Range Goal: Disease mgmt   ?Start Date: 04/18/2021  ?Expected End Date: 04/18/2022  ?This Visit's Progress: On track  ?Recent Progress: On track  ?Priority: High  ?Note:   ?Current Barriers:  ?Unable to independently monitor therapeutic efficacy ?Suboptimal therapeutic regimen for anxiety ? ?Pharmacist Clinical Goal(s):  ?Patient will achieve adherence to monitoring guidelines and medication adherence to achieve therapeutic efficacy ?adhere to plan to optimize therapeutic regimen for anxiety as evidenced by report of adherence to recommended medication management changes through collaboration with PharmD and provider.  ? ?Interventions: ?1:1 collaboration with Venia Carbon, MD regarding development and update of comprehensive plan of care as evidenced by provider attestation and co-signature ?Inter-disciplinary care team collaboration (see longitudinal plan of care) ?Comprehensive medication review performed; medication list updated in electronic medical record ? ?Hyperlipidemia: (LDL goal < 130) ?-Not ideally controlled  - LDL is elevated w/ hx of aortic atherosclerosis on X-ray. Holding off on statin per PCP. ?-Current treatment: ?OTC Omega-3 fish oil - Appropriate, Query Effective, Safe, Accessible ?-Medications previously tried: none  ?-Educated on Cholesterol goals; Importance of limiting foods high in cholesterol; ?-Counseled on diet and exercise extensively ? ?Anxiety (Goal: manage symptoms) ?-Improving - pt reports improvement in stress-related GI distress and hot flashes since starting escitalopram last month; she also reports a new strange tingling/twitching sensation in her lips; it is not clear if this is related to escitalopram, as she has skipped dose a few days and tingling happens regardless ?- pt takes xanax 1/2 tab when needed, not every day; she reports alprazolam helps with indigestion issues; discussed link between stress/anxiety and GI issues ?-Current treatment: ?Alprazolam 0.25 mg 1/2-1 tab TID prn - Appropriate, Effective, Query Safe, Accessible ?Escitalopram 5 mg daily - Appropriate, Effective, Safe, Accessible ?-Medications previously tried/failed: none ?-Discussed option to try SSRI for frequent anxiety/stress that is contributing to GI issues; ideally pt may be able to cut back on alprazolam use ?-Advised to take escitalopram every other day, monitor lip tingling, and let PCP know result at f/u visit in 2 weeks ? ?Hypothyroidism (Goal: maintain TSH in goal range) ?-Improving - pt c/o hot flashes that are getting worse. She  recently switched to taking levothyroxine 30 min before breakfast whereas before she was taking w/ breakfast (one of her friends told her to take it this way); most recent TSH was 0.78 (12/2020) when she was taking levothyroxine with breakfast ?-Update 05/17/21: pt has moved levothyroxine back to taking w/ breakfast, she reports improvement in hot flashes (also benefit of Lexapro) ?-Current treatment  ?Levothyroxine 100 mcg daily - Appropriate, Effective, Safe, Accessible ?-Discussed  levothyroxine is absorbed better on an empty stomach, however in her case this is not necessarily ideal as her most recent TSH was borderline hyperthyroid; hot flashes may be stemming from increasing thyroid activity due to improved absorption of levothyroxine on empty stomach ?-Recommend to continue current medication ? ?GERD (Goal: manage symptoms) ?-Improved -pt reports food "piles up" when she is eating; she reports symptoms are improved by alprazolam - discussed link between anxiety and GI issues ?-Pt previously stopped PPI d/t previous doctor warning of memory loss ?-05/16/21 update: pt reports improvement in reflux since starting lexapro ?-Current treatment  ?Omeprazole 20 mg QOD - Appropriate, Effective, Query Safe, Accessible ?Gas relief ?-chronic PPI use is associated with decreased bone density; some studies have linked it to dementia as well, although consensus is unclear;  ?-Recommended to continue current medication  ? ?Osteopenia (Goal prevent fractures) ?-Controlled ?-Last DEXA Scan: 03/26/16  ? T-Score femoral neck: -1.0 ? T-Score total hip: -1.0 ? T-Score lumbar spine: +0.5 ?-Patient is not a candidate for pharmacologic treatment ?-Current treatment  ?Vitamin D 1000 IU daily ?-Recommend weight-bearing and muscle strengthening exercises for building and maintaining bone density. ? ?Health Maintenance ?-Vaccine gaps: Flu, covid booster, TDAP, Shingrix ?Covid boosters: 12/06/20, 08/02/20 per patient vaccine card ?Flu shot: [facility] in Fall 2022 per patient report ? ?Patient Goals/Self-Care Activities ?Patient will:  ?- take medications as prescribed as evidenced by patient report and record review ?-focus on medication adherence by pill box ?-Resume taking levothyroxine with breakfast (as previous) ?  ?  ? ?The patient verbalized understanding of instructions, educational materials, and care plan provided today and declined offer to receive copy of patient instructions, educational materials, and care  plan.  ?Telephone follow up appointment with pharmacy team member scheduled for: 3 months ? ?Charlene Brooke, PharmD, BCACP ?Clinical Pharmacist ?[facility] at [facility] ?[phone] ?  ?

## 2021-05-29 ENCOUNTER — Other Ambulatory Visit: Payer: Self-pay | Admitting: Internal Medicine

## 2021-05-30 ENCOUNTER — Ambulatory Visit (INDEPENDENT_AMBULATORY_CARE_PROVIDER_SITE_OTHER): Payer: Medicare Other | Admitting: Internal Medicine

## 2021-05-30 ENCOUNTER — Other Ambulatory Visit: Payer: Self-pay

## 2021-05-30 ENCOUNTER — Encounter: Payer: Self-pay | Admitting: Internal Medicine

## 2021-05-30 DIAGNOSIS — F39 Unspecified mood [affective] disorder: Secondary | ICD-10-CM | POA: Diagnosis not present

## 2021-05-30 DIAGNOSIS — R1319 Other dysphagia: Secondary | ICD-10-CM

## 2021-05-30 DIAGNOSIS — M25511 Pain in right shoulder: Secondary | ICD-10-CM | POA: Diagnosis not present

## 2021-05-30 MED ORDER — ESCITALOPRAM OXALATE 5 MG PO TABS: 5.0000 mg | ORAL_TABLET | ORAL | 3 refills | Status: DC

## 2021-05-30 NOTE — Assessment & Plan Note (Signed)
Not MDD but persistent dysthymia ?Doing well on escitalopram 5mg  every other day ?Getting out more ?Will continue this ?

## 2021-05-30 NOTE — Assessment & Plan Note (Signed)
Pharmacist was able to convince her to start the omeprazole ?At 20mg  every other day, the dysphagia is gone ?I asked her to continue this ?

## 2021-05-30 NOTE — Assessment & Plan Note (Signed)
No major findings ?Mild impingement but fair ROM ?May just be degenerative ?Discussed tylenol 650mg  tid prn ?

## 2021-05-30 NOTE — Progress Notes (Signed)
? ?Subjective:  ? ? Patient ID: Diana Owen, female    DOB: 10/12/1934, 87 y.o.   MRN: [id number] ? ?HPI ?Here for follow up after starting escitalopram about 6 weeks ago ? ?Had CCM consultation ?Regular feelings of depression----would try to stay active to work her way through it ?Was using the xanax for this ? ?Tried the escitalopram --thought she had diarrhea after 2 days ?Went down to every other day ?She notes that she is not having the depression as much ?Is getting out--seeing other people. Events at [facility] several times a week ?She walks with rollator ? ?Also started the omeprazole 20mg  every other day ?Now she feels her swallowing is better--no dysphagia now ? ?Having some right shoulder problems ?Hurts when she brings it up---like to put on a coat ?Not too bad ? ?Current Outpatient Medications on File Prior to Visit  ?Medication Sig Dispense Refill  ? ALPRAZolam (XANAX) 0.25 MG tablet TAKE 1/2 TO 1 TABLET(0.125 TO 0.25 MG) BY MOUTH THREE TIMES DAILY AS NEEDED FOR ANXIETY 30 tablet 0  ? cholecalciferol (VITAMIN D) 1000 units tablet Take 1,000 Units by mouth daily.    ? Cyanocobalamin (VITAMIN B-12) 6000 MCG SUBL Place 1 tablet under the tongue daily.    ? escitalopram (LEXAPRO) 5 MG tablet Take 1 tablet (5 mg total) by mouth daily. 30 tablet 1  ? levothyroxine (SYNTHROID) 100 MCG tablet TAKE 1 TABLET(100 MCG) BY MOUTH DAILY 90 tablet 3  ? Multiple Vitamins-Minerals (MULTIVITAMIN WITH MINERALS) tablet Take 1 tablet by mouth daily.    ? Omega-3 Fatty Acids (FISH OIL PO) Take by mouth.    ? omeprazole (PRILOSEC) 20 MG capsule Take 1 capsule (20 mg total) by mouth every other day. 15 capsule 11  ? polyethylene glycol (MIRALAX / GLYCOLAX) packet Take 17 g by mouth daily.    ? simethicone (MYLICON) 80 MG chewable tablet Chew 80 mg by mouth every 6 (six) hours as needed for flatulence.    ? vitamin E 100 UNIT capsule Take 100 Units by mouth daily.    ? ?No current facility-administered medications on file  prior to visit.  ? ? ?Allergies  ?Allergen Reactions  ? Codeine Nausea And Vomiting  ? Adhesive [Tape] Rash  ?  Paper tape is okay  ? Latex Rash  ?  Probably adhesive allergy.  bandaids bother patient  ? ? ?Past Medical History:  ?Diagnosis Date  ? Anxiety   ? Arthritis   ? finger!!  ? Barrett's esophagus 2000  ? GERD (gastroesophageal reflux disease)   ? History of hiatal hernia   ? Hypothyroidism   ? 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 Intractable vomiting with nausea   ? occurs with each obstructive episode. if patient gets up during meal to let food pass, then n & v is not so bad  ? Midgut volvulus   ? SBO (small bowel obstruction) (HCC) 11/13/2016  ? Small bowel obstruction (HCC)   ? Small bowel obstruction due to postoperative adhesions 06/29/2017  ? Thyroid disease   ? ? ?Past Surgical History:  ?Procedure Laterality Date  ? APPENDECTOMY  2002  ? LAPAROTOMY N/A 09/28/2017  ? Procedure: EXPLORATORY LAPAROTOMY WITH LYSIS OF ADHESIONS;  Surgeon: 09/30/2017, MD;  Location: [facility] ORS;  Service: General;  Laterality: N/A;  ? PARTIAL COLECTOMY  2002  ? large polyp  ? TONSILLECTOMY    ? TUBAL LIGATION    ? ? ?Family History  ?Problem Relation Age  of Onset  ? Leukemia Mother   ?     CLL  ? Leukemia Father   ? Hyperlipidemia Brother   ? Stroke Brother   ? Heart disease Paternal Uncle   ? Diabetes Neg Hx   ? ? ?Social History  ? ?Socioeconomic History  ? Marital status: Married  ?  Spouse name: Not on file  ? Number of children: 3  ? Years of education: Not on file  ? Highest education level: Not on file  ?Occupational History  ? Occupation: Diplomatic Services operational officer  ? 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  Comment: retired  ?Tobacco Use  ? Smoking status: Never  ? Smokeless tobacco: Never  ?Vaping Use  ? Vaping Use: Never used  ?Substance and Sexual Activity  ? Alcohol use: Yes  ?  Comment: rare wine   ? Drug use: No  ? Sexual activity: Yes  ?Other Topics Concern  ? Not on file  ?Social History Narrative  ? 2  sons, 1 daughter--- Brett Canales in [place]  ? [name] in [place], Selinda Orion in [place]  ?   ? Has a living will  ? Daughter [name], then sons, should make health care decisions  ? Would accept resuscitation attempts  ? Not sure about tube feeds  ? ?Social Determinants of Health  ? ?Financial Resource Strain: Low Risk   ? Difficulty of Paying Living Expenses: Not hard at all  ?Food Insecurity: No Food Insecurity  ? Worried About Programme researcher, broadcasting/film/video in the Last Year: Never true  ? Ran Out of Food in the Last Year: Never true  ?Transportation Needs: Not on file  ?Physical Activity: Not on file  ?Stress: Not on file  ?Social Connections: Not on file  ?Intimate Partner Violence: Not on file  ? ?Review of Systems ?Sleeping well ?Appetite is good--weight stable. Some limitations in eating due to dental issues ?   ?Objective:  ? Physical Exam ?Constitutional:   ?   Appearance: Normal appearance.  ?Abdominal:  ?   Palpations: Abdomen is soft.  ?   Tenderness: There is no abdominal tenderness.  ?Musculoskeletal:  ?   Comments: Right shoulder---no swelling ?Fair abduction ?Mild restriction in internal and external rotation  ?Neurological:  ?   Mental Status: She is alert.  ?Psychiatric:     ?   Mood and Affect: Mood normal.     ?   Behavior: Behavior normal.  ?  ? ? ? ? ?   ?Assessment & Plan:  ? ?

## 2021-06-14 DIAGNOSIS — E039 Hypothyroidism, unspecified: Secondary | ICD-10-CM

## 2021-06-14 DIAGNOSIS — E785 Hyperlipidemia, unspecified: Secondary | ICD-10-CM

## 2021-07-09 ENCOUNTER — Other Ambulatory Visit: Payer: Self-pay | Admitting: Internal Medicine

## 2021-07-09 NOTE — Telephone Encounter (Signed)
Last filled 05-06-21 #30 ?Last OV 05-30-21 ?Next OV 01-01-22 ?[facility] [address]  ?

## 2021-07-22 ENCOUNTER — Telehealth (INDEPENDENT_AMBULATORY_CARE_PROVIDER_SITE_OTHER): Payer: Medicare Other | Admitting: Family

## 2021-07-22 ENCOUNTER — Telehealth: Payer: Self-pay | Admitting: Family

## 2021-07-22 ENCOUNTER — Encounter: Payer: Self-pay | Admitting: Family

## 2021-07-22 VITALS — Ht 61.0 in | Wt 126.0 lb

## 2021-07-22 DIAGNOSIS — J019 Acute sinusitis, unspecified: Secondary | ICD-10-CM

## 2021-07-22 DIAGNOSIS — B9789 Other viral agents as the cause of diseases classified elsewhere: Secondary | ICD-10-CM | POA: Insufficient documentation

## 2021-07-22 DIAGNOSIS — U071 COVID-19: Secondary | ICD-10-CM

## 2021-07-22 NOTE — Assessment & Plan Note (Signed)
Suspected viral sinusitis ?Recommend claritin/allegra otc daily ?Recommend mucinex otc, not DM.  ?Increase water intake ?Tylenol prn fever ?Try to get covid tested, if positive consider antiviral ?Suspected will take viral course.  ?Advised patient on supportive measures:  Be sure to rest, drink plenty of fluids.  Follow up if fever >101, if symptoms worsen or if symptoms are not improved in 3 days. Patient verbalizes understanding.  ? ? ? ?

## 2021-07-22 NOTE — Patient Instructions (Signed)
Recommend starting claritin.  ?Recommend mucinex over the counter as well.  ?Test for covid, if positive will consider antiviral.  ? ?Be sure to rest, drink plenty of fluids, and use tylenol or ibuprofen as needed for pain. Follow up if fever >101, if symptoms worsen or if symptoms are not improved in 3 days.  ? ?You should be feeling better by day seven of symptoms, but please do contact me if this is not the case. ? ?Due to recent changes in healthcare laws, you may see results of your imaging and/or laboratory studies on MyChart before I have had a chance to review them.  I understand that in some cases there may be results that are confusing or concerning to you. Please understand that not all results are received at the same time and often I may need to interpret multiple results in order to provide you with the best plan of care or course of treatment. Therefore, I ask that you please give me 2 business days to thoroughly review all your results before contacting my office for clarification. Should we see a critical lab result, you will be contacted sooner.  ? ?It was a pleasure seeing you today! Please do not hesitate to reach out with any questions and or concerns. ? ?Regards,  ? ?Lurleen Soltero ?FNP-C ? ?

## 2021-07-22 NOTE — Telephone Encounter (Signed)
Pt called back and said that she did test positive for covid, she wanted to know how long she should quarantine for and asked for a call back at [phone] ?

## 2021-07-22 NOTE — Progress Notes (Signed)
? ? ?MyChart Video Visit ? ? ? ?Virtual Visit via Video Note  ? ?This visit type was conducted due to national recommendations for restrictions regarding the COVID-19 Pandemic (e.g. social distancing) in an effort to limit this patient's exposure and mitigate transmission in our community. This patient is at least at moderate risk for complications without adequate follow up. This format is felt to be most appropriate for this patient at this time. Physical exam was limited by quality of the video and audio technology used for the visit. CMA was able to get the patient set up on a video visit. ? ?Patient location: Home. Patient and provider in visit ?Provider location: Office ? ?I discussed the limitations of evaluation and management by telemedicine and the availability of in person appointments. The patient expressed understanding and agreed to proceed. ? ?Visit Date: 07/22/2021 ? ?Today's healthcare provider: Eugenia Pancoast, FNP  ? ? ? ?Subjective:  ? ? Patient ID: Diana Owen, female    DOB: [birth year], 87 y.o.   MRN: [id number] ? ?Chief Complaint  ?Patient presents with  ? Sinus Problem  ?  X 2 days aching  ? Cough  ?  Making stomach is sore  ? Fever  ?  Started Sunday.  ? ? ?Sinus Problem ?Associated symptoms include chills, congestion, coughing, sinus pressure and a sore throat. Pertinent negatives include no ear pain, shortness of breath or sneezing.  ?Cough ?Associated symptoms include chills, a fever and a sore throat. Pertinent negatives include no chest pain, ear pain, postnasal drip, rhinorrhea, shortness of breath or wheezing.  ?Fever  ?Associated symptoms include congestion, coughing and a sore throat. Pertinent negatives include no chest pain, ear pain or wheezing.  ? ?Pt here today via video visit with concerns.  ? ?For the last three days with sinus pressure, runny nose, a lot of coughing that is dry with chest congestion. Also had a fever (could tell because she had chills) yesterday. No sore  throat. ? ?Warm salty water up nose with mild improvement.  ? ?Tylenol only as needed. ? ?Past Medical History:  ?Diagnosis Date  ? Anxiety   ? Arthritis   ? finger!!  ? Barrett's esophagus 2000  ? GERD (gastroesophageal reflux disease)   ? History of hiatal hernia   ? Hypothyroidism   ? Intestinal adhesions with complete obstruction ([place]) 04/25/2017  ? Intractable vomiting with nausea   ? occurs with each obstructive episode. if patient gets up during meal to let food pass, then n & v is not so bad  ? Midgut volvulus   ? SBO (small bowel obstruction) ([place]) 11/13/2016  ? Small bowel obstruction ([place])   ? Small bowel obstruction due to postoperative adhesions 06/29/2017  ? Thyroid disease   ? ? ?Past Surgical History:  ?Procedure Laterality Date  ? APPENDECTOMY  2002  ? LAPAROTOMY N/A 09/28/2017  ? Procedure: EXPLORATORY LAPAROTOMY WITH LYSIS OF ADHESIONS;  Surgeon: Vickie Epley, MD;  Location: [facility] ORS;  Service: General;  Laterality: N/A;  ? PARTIAL COLECTOMY  2002  ? large polyp  ? TONSILLECTOMY    ? TUBAL LIGATION    ? ? ?Family History  ?Problem Relation Age of Onset  ? Leukemia Mother   ?     CLL  ? Leukemia Father   ? Hyperlipidemia Brother   ? Stroke Brother   ? Heart disease Paternal Uncle   ? Diabetes Neg Hx   ? ? ?Social History  ? ?Socioeconomic History  ? Marital status:  Married  ?  Spouse name: Not on file  ? Number of children: 3  ? Years of education: Not on file  ? Highest education level: Not on file  ?Occupational History  ? Occupation: Network engineer  ? 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  Comment: retired  ?Tobacco Use  ? Smoking status: Never  ? Smokeless tobacco: Never  ?Vaping Use  ? Vaping Use: Never used  ?Substance and Sexual Activity  ? Alcohol use: Yes  ?  Comment: rare wine   ? Drug use: No  ? Sexual activity: Yes  ?Other Topics Concern  ? Not on file  ?Social History Narrative  ? 2 sons, 1 daughter--- Richardson Landry in [place]  ? Bethena Roys in [place], Gardiner Rhyme in [place]  ?   ? Has a living will  ? Daughter  Bethena Roys, then sons, should make health care decisions  ? Would accept resuscitation attempts  ? Not sure about tube feeds  ? ?Social Determinants of Health  ? ?Financial Resource Strain: Low Risk   ? Difficulty of Paying Living Expenses: Not hard at all  ?Food Insecurity: No Food Insecurity  ? Worried About Charity fundraiser in the Last Year: Never true  ? Ran Out of Food in the Last Year: Never true  ?Transportation Needs: Not on file  ?Physical Activity: Not on file  ?Stress: Not on file  ?Social Connections: Not on file  ?Intimate Partner Violence: Not on file  ? ? ?Outpatient Medications Prior to Visit  ?Medication Sig Dispense Refill  ? ALPRAZolam (XANAX) 0.25 MG tablet TAKE 1/2 TO 1 TABLET(0.125 TO 0.25 MG) BY MOUTH THREE TIMES DAILY AS NEEDED FOR ANXIETY 30 tablet 0  ? Cyanocobalamin (VITAMIN B-12) 6000 MCG SUBL Place 1 tablet under the tongue daily.    ? escitalopram (LEXAPRO) 5 MG tablet Take 1 tablet (5 mg total) by mouth every other day. 45 tablet 3  ? levothyroxine (SYNTHROID) 100 MCG tablet TAKE 1 TABLET(100 MCG) BY MOUTH DAILY 90 tablet 3  ? Multiple Vitamins-Minerals (MULTIVITAMIN WITH MINERALS) tablet Take 1 tablet by mouth daily.    ? Omega-3 Fatty Acids (FISH OIL PO) Take by mouth.    ? omeprazole (PRILOSEC) 20 MG capsule Take 1 capsule (20 mg total) by mouth every other day. 15 capsule 11  ? polyethylene glycol (MIRALAX / GLYCOLAX) packet Take 17 g by mouth daily.    ? simethicone (MYLICON) 80 MG chewable tablet Chew 80 mg by mouth every 6 (six) hours as needed for flatulence.    ? vitamin E 100 UNIT capsule Take 100 Units by mouth daily.    ? cholecalciferol (VITAMIN D) 1000 units tablet Take 1,000 Units by mouth daily.    ? ?No facility-administered medications prior to visit.  ? ? ?Allergies  ?Allergen Reactions  ? Codeine Nausea And Vomiting  ? Adhesive [Tape] Rash  ?  Paper tape is okay  ? Latex Rash  ?  Probably adhesive allergy.  bandaids bother patient  ? ? ?Review of Systems   ?Constitutional:  Positive for chills and fever. Negative for fatigue.  ?HENT:  Positive for congestion, sinus pressure, sinus pain and sore throat. Negative for ear pain, postnasal drip, rhinorrhea and sneezing.   ?Eyes:  Negative for discharge.  ?Respiratory:  Positive for cough. Negative for chest tightness, shortness of breath and wheezing.   ?Cardiovascular:  Negative for chest pain and palpitations.  ? ?   ?Objective:  ?  ?Physical Exam ?Vitals reviewed.  ?Constitutional:   ?  General: She is not in acute distress. ?   Appearance: Normal appearance. She is not ill-appearing.  ?HENT:  ?   Nose: No congestion or rhinorrhea.  ?   Right Turbinates: Not enlarged or swollen.  ?   Left Turbinates: Not enlarged or swollen.  ?   Right Sinus: No maxillary sinus tenderness or frontal sinus tenderness.  ?   Left Sinus: No maxillary sinus tenderness or frontal sinus tenderness.  ?   Mouth/Throat:  ?   Pharynx: No pharyngeal swelling, oropharyngeal exudate or posterior oropharyngeal erythema.  ?   Tonsils: No tonsillar exudate.  ?Neck:  ?   Thyroid: No thyroid mass.  ?Pulmonary:  ?   Effort: Pulmonary effort is normal.  ?Lymphadenopathy:  ?   Cervical:  ?   Right cervical: No superficial cervical adenopathy. ?   Left cervical: No superficial cervical adenopathy.  ?Neurological:  ?   General: No focal deficit present.  ?   Mental Status: She is alert and oriented to person, place, and time.  ?Psychiatric:     ?   Mood and Affect: Mood normal.     ?   Behavior: Behavior normal.     ?   Thought Content: Thought content normal.     ?   Judgment: Judgment normal.  ? ? ?Ht 5\' 1"  (1.549 m)   Wt 126 lb (57.2 kg)   BMI 23.81 kg/m?  ?Wt Readings from Last 3 Encounters:  ?07/22/21 126 lb (57.2 kg)  ?05/30/21 126 lb (57.2 kg)  ?01/01/21 125 lb (56.7 kg)  ? ? ?   ?Assessment & Plan:  ? ?Problem List Items Addressed This Visit   ? ?  ? Respiratory  ? Acute viral sinusitis - Primary  ?  Suspected viral sinusitis ?Recommend  claritin/allegra otc daily ?Recommend mucinex otc, not DM.  ?Increase water intake ?Tylenol prn fever ?Try to get covid tested, if positive consider antiviral ?Suspected will take viral course.  ?Advised patient on supportive measures

## 2021-07-23 DIAGNOSIS — U071 COVID-19: Secondary | ICD-10-CM | POA: Insufficient documentation

## 2021-07-23 MED ORDER — MOLNUPIRAVIR EUA 200MG CAPSULE: 4.0000 | ORAL_CAPSULE | Freq: Two times a day (BID) | ORAL | 0 refills | Status: AC

## 2021-07-23 NOTE — Telephone Encounter (Signed)
Called patient reviewed all information and repeated back to me. Will call if any questions.  ? ?

## 2021-08-07 DIAGNOSIS — L821 Other seborrheic keratosis: Secondary | ICD-10-CM | POA: Diagnosis not present

## 2021-08-07 DIAGNOSIS — L820 Inflamed seborrheic keratosis: Secondary | ICD-10-CM | POA: Diagnosis not present

## 2021-08-07 DIAGNOSIS — L814 Other melanin hyperpigmentation: Secondary | ICD-10-CM | POA: Diagnosis not present

## 2021-08-07 DIAGNOSIS — L570 Actinic keratosis: Secondary | ICD-10-CM | POA: Diagnosis not present

## 2021-08-14 ENCOUNTER — Telehealth: Payer: Self-pay

## 2021-08-14 NOTE — Progress Notes (Signed)
    Chronic Care Management Pharmacy Assistant   Name: Ha Shannahan  MRN: [id number] DOB: 04-18-1934  Reason for Encounter: CCM (Appointment Reminder)  Medications: Outpatient Encounter Medications as of 08/14/2021  Medication Sig   ALPRAZolam (XANAX) 0.25 MG tablet TAKE 1/2 TO 1 TABLET(0.125 TO 0.25 MG) BY MOUTH THREE TIMES DAILY AS NEEDED FOR ANXIETY   cholecalciferol (VITAMIN D) 1000 units tablet Take 1,000 Units by mouth daily.   Cyanocobalamin (VITAMIN B-12) 6000 MCG SUBL Place 1 tablet under the tongue daily.   escitalopram (LEXAPRO) 5 MG tablet Take 1 tablet (5 mg total) by mouth every other day.   levothyroxine (SYNTHROID) 100 MCG tablet TAKE 1 TABLET(100 MCG) BY MOUTH DAILY   Multiple Vitamins-Minerals (MULTIVITAMIN WITH MINERALS) tablet Take 1 tablet by mouth daily.   Omega-3 Fatty Acids (FISH OIL PO) Take by mouth.   omeprazole (PRILOSEC) 20 MG capsule Take 1 capsule (20 mg total) by mouth every other day.   polyethylene glycol (MIRALAX / GLYCOLAX) packet Take 17 g by mouth daily.   simethicone (MYLICON) 80 MG chewable tablet Chew 80 mg by mouth every 6 (six) hours as needed for flatulence.   vitamin E 100 UNIT capsule Take 100 Units by mouth daily.   No facility-administered encounter medications on file as of 08/14/2021.   Lillan Mccreadie was contacted to remind of upcoming telephone visit with Al Corpus on 08/16/2021 at 8:45. Patient was reminded to have any blood glucose and blood pressure readings available for review at appointment.   Message was left reminding patient of appointment.  CCM referral has been placed prior to visit?  No   Star Rating Drugs: Medication:  Last Fill: Day Supply No star rating drugs  Al Corpus, CPP notified  Claudina Lick, [place] Clinical Pharmacy Assistant [phone]

## 2021-08-16 ENCOUNTER — Ambulatory Visit: Payer: Medicare Other | Admitting: Pharmacist

## 2021-08-16 DIAGNOSIS — R1319 Other dysphagia: Secondary | ICD-10-CM

## 2021-08-16 DIAGNOSIS — F39 Unspecified mood [affective] disorder: Secondary | ICD-10-CM

## 2021-08-16 DIAGNOSIS — E785 Hyperlipidemia, unspecified: Secondary | ICD-10-CM

## 2021-08-16 DIAGNOSIS — E039 Hypothyroidism, unspecified: Secondary | ICD-10-CM

## 2021-08-16 NOTE — Progress Notes (Signed)
Chronic Care Management Pharmacy Note  08/16/2021 Name:  Diana Owen MRN:  [id number] DOB:  29-Sep-1934  Summary: CCM F/U visit -Reviewed medications; pt affirms adherence as prescribed -Pt reports overall improvement in anxiety and GI symptoms with escitalopram and omeprazole every other day dosing  Recommendations/Changes made from today's visit: -No med changes  Plan: -[place] will call patient 3 months for general adherence review -Pharmacist follow up televisit scheduled for 6 months -PCP F/U 01/01/22    Subjective: Diana Owen is an 87 y.o. year old female who is a primary patient of Venia Carbon, MD.  The CCM team was consulted for assistance with disease management and care coordination needs.    Engaged with patient by telephone for follow up visit in response to provider referral for pharmacy case management and/or care coordination services.   Consent to Services:  The patient was given information about Chronic Care Management services, agreed to services, and gave verbal consent prior to initiation of services.  Please see initial visit note for detailed documentation.   Patient Care Team: Venia Carbon, MD as PCP - General (Internal Medicine) Charlton Haws, [facility] as Pharmacist (Pharmacist)  Recent office visits: 07/22/21 NP Eugenia Pancoast VV: acute viral sinusitis - rec Claritin, Mucinex. Test for COVID positive, start [place].   05/30/21 Dr Silvio Pate OV: f/u depression. Reduce escitalopram to QOD (pt preference). No med changes.  01/01/21-PCP-Richard Letvak,MD-Patient presented for AWV. Labs ordered( labs look good,Cholesterol mildly elevated) discussed vaccines, screenings, Restart Omeprazole every other day,make appointment with [facility],try tylenol [phone]  up to 3 times a day for back pain.  Recent consult visits: 04/09/21-Podiatry-Kevin Patel,DPM-patient presented for toenail trimming. 11/22/20-Podiatry-Kevin Patel,DPM-  Patient presented for toenail trimming.No medication changes  Hospital visits: 10/17/20-[facility] ED- Dylan Smith,MD-Patient presented for back pain.Observation and discharge to home, no medication changes   Objective:  Lab Results  Component Value Date   CREATININE 0.64 01/01/2021   BUN 16 01/01/2021   GFR 81.01 01/01/2021   GFRNONAA >60 09/30/2017   GFRAA >60 09/30/2017   NA 141 01/01/2021   K 4.2 01/01/2021   CALCIUM 9.7 01/01/2021   CO2 32 01/01/2021   GLUCOSE 79 01/01/2021    Lab Results  Component Value Date/Time   GFR 81.01 01/01/2021 11:47 AM   GFR 82.51 12/16/2019 11:32 AM    Last diabetic Eye exam: No results found for: HMDIABEYEEXA  Last diabetic Foot exam: No results found for: HMDIABFOOTEX   Lab Results  Component Value Date   CHOL 232 (H) 01/01/2021   HDL 64.80 01/01/2021   LDLCALC 147 (H) 01/01/2021   TRIG 99.0 01/01/2021   CHOLHDL 4 01/01/2021       Latest Ref Rng & Units 01/01/2021   11:47 AM 12/16/2019   11:32 AM 12/09/2018    3:01 PM  Hepatic Function  Total Protein 6.0 - 8.3 g/dL 6.6   6.9   6.8    Albumin 3.5 - 5.2 g/dL 4.1   4.3   4.2    AST 0 - 37 U/L 21   19   21     ALT 0 - 35 U/L 16   15   15     Alk Phosphatase 39 - 117 U/L 68   65   62    Total Bilirubin 0.2 - 1.2 mg/dL 0.7   0.9   0.6      Lab Results  Component Value Date/Time   TSH 0.78 01/01/2021 11:47 AM   TSH 0.43  12/16/2019 11:32 AM   FREET4 1.04 01/01/2021 11:47 AM   FREET4 1.07 12/16/2019 11:32 AM       Latest Ref Rng & Units 01/01/2021   11:47 AM 12/16/2019   11:32 AM 12/09/2018    3:01 PM  CBC  WBC 4.0 - 10.5 K/uL 6.6   8.3   7.1    Hemoglobin 12.0 - 15.0 g/dL 13.7   14.1   13.0    Hematocrit 36.0 - 46.0 % 41.0   41.8   38.5    Platelets 150.0 - 400.0 K/uL 198.0   208.0   198.0      No results found for: VD25OH  Clinical ASCVD: Yes  - aortic atherosclerosis The ASCVD Risk score (Arnett DK, et al., 2019) failed to calculate for the following reasons:   The 2019  ASCVD risk score is only valid for ages 46 to 74       01/01/2021   11:32 AM 01/01/2021   10:58 AM 12/09/2018    2:17 PM  Depression screen PHQ 2/9  Decreased Interest 0 0 0  Down, Depressed, Hopeless 1 0 0  PHQ - 2 Score 1 0 0     Social History   Tobacco Use  Smoking Status Never  Smokeless Tobacco Never   BP Readings from Last 3 Encounters:  05/30/21 120/76  01/01/21 114/76  10/17/20 131/62   Pulse Readings from Last 3 Encounters:  05/30/21 82  01/01/21 80  10/17/20 60   Wt Readings from Last 3 Encounters:  07/22/21 126 lb (57.2 kg)  05/30/21 126 lb (57.2 kg)  01/01/21 125 lb (56.7 kg)   BMI Readings from Last 3 Encounters:  07/22/21 23.81 kg/m  05/30/21 23.81 kg/m  01/01/21 23.62 kg/m    Assessment/Interventions: Review of patient past medical history, allergies, medications, health status, including review of consultants reports, laboratory and other test data, was performed as part of comprehensive evaluation and provision of chronic care management services.   SDOH:  (Social Determinants of Health) assessments and interventions performed: No - done Feb 2023   SDOH Screenings   Alcohol Screen: Not on file  Depression (PHQ2-9): Low Risk    PHQ-2 Score: 1  Financial Resource Strain: Low Risk    Difficulty of Paying Living Expenses: Not hard at all  Food Insecurity: No Food Insecurity   Worried About Charity fundraiser in the Last Year: Never true   Ran Out of Food in the Last Year: Never true  Housing: Not on file  Physical Activity: Not on file  Social Connections: Not on file  Stress: Not on file  Tobacco Use: Low Risk    Smoking Tobacco Use: Never   Smokeless Tobacco Use: Never   Passive Exposure: Not on file  Transportation Needs: Not on file    CCM Care Plan  Allergies  Allergen Reactions   Codeine Nausea And Vomiting   Adhesive [Tape] Rash    Paper tape is okay   Latex Rash    Probably adhesive allergy.  bandaids bother patient     Medications Reviewed Today     Reviewed by Charlton Haws, [facility] (Pharmacist) on 08/16/21 at 0907  Med List Status: <None>   Medication Order Taking? Sig Documenting Provider Last Dose Status Informant  ALPRAZolam (XANAX) 0.25 MG tablet [id number] Yes TAKE 1/2 TO 1 TABLET(0.125 TO 0.25 MG) BY MOUTH THREE TIMES DAILY AS NEEDED FOR ANXIETY Venia Carbon, MD Taking Active   cholecalciferol (VITAMIN D) 1000 units  tablet [id number] Yes Take 1,000 Units by mouth daily. [provider] Taking Active Self  Cyanocobalamin (VITAMIN B-12) 6000 MCG SUBL [id number] Yes Place 1 tablet under the tongue daily. [provider] Taking Active Self  escitalopram (LEXAPRO) 5 MG tablet [id number] Yes Take 1 tablet (5 mg total) by mouth every other day. Venia Carbon, MD Taking Active   levothyroxine (SYNTHROID) 100 MCG tablet [id number] Yes TAKE 1 TABLET(100 MCG) BY MOUTH DAILY Venia Carbon, MD Taking Active   Multiple Vitamins-Minerals (MULTIVITAMIN WITH MINERALS) tablet [id number] Yes Take 1 tablet by mouth daily. [provider] Taking Active Self  Omega-3 Fatty Acids (FISH OIL PO) [id number] Yes Take by mouth. [provider] Taking Active   omeprazole (PRILOSEC) 20 MG capsule [id number] Yes Take 1 capsule (20 mg total) by mouth every other day. Venia Carbon, MD Taking Active   polyethylene glycol [facility] / GLYCOLAX) packet [id number] Yes Take 17 g by mouth daily. [provider] Taking Active Self  simethicone (MYLICON) 80 MG chewable tablet [id number] Yes Chew 80 mg by mouth every 6 (six) hours as needed for flatulence. [provider] Taking Active   vitamin E 100 UNIT capsule [id number] Yes Take 100 Units by mouth daily. [provider] Taking Active Self            Patient Active Problem List   Diagnosis Date Noted   COVID-19 07/23/2021   Acute viral sinusitis 07/22/2021   Right shoulder pain 05/30/2021   Mixed stress and  urge urinary incontinence 06/26/2020   Scoliosis 06/26/2020   Aortic atherosclerosis ([place]) 12/16/2019   Mood disorder ([place]) 12/09/2018   Left hip pain 10/25/2018   Preventative health care 11/26/2017   Advance directive discussed with patient 11/26/2017   Other idiopathic scoliosis, thoracolumbar region 07/16/2016   Esophageal dysphagia 09/26/2015   Acid reflux 09/04/2015   Hyperlipidemia 09/04/2015   Hypothyroidism 09/04/2015    Immunization History  Administered Date(s) Administered   Influenza Split 12/27/2015   Influenza,inj,Quad PF,6+ Mos 11/21/2016, 11/26/2017   Influenza-Unspecified 11/16/2013, 12/25/2014   Moderna Covid-19 Vaccine Bivalent Booster 12yr & up 12/06/2020   Moderna Sars-Covid-2 Vaccination 03/29/2019, 04/26/2019, 08/02/2020   Pneumococcal Conjugate-13 12/25/2014   Pneumococcal Polysaccharide-23 11/09/2009, 11/26/2017   Tdap 11/13/2006   Zoster, Live 09/28/2009    Conditions to be addressed/monitored:  Hyperlipidemia, GERD, Hypothyroidism, Anxiety, and Osteopenia  Care Plan : CBazine Updates made by FCharlton Haws RMetzsince 08/16/2021 12:00 AM     Problem: Hyperlipidemia, GERD, Hypothyroidism, Anxiety, and Osteopenia   Priority: High     Long-Range Goal: Disease mgmt   Start Date: 04/18/2021  Expected End Date: 04/18/2022  Recent Progress: On track  Priority: High  Note:   Current Barriers:  None identified  Pharmacist Clinical Goal(s):  Patient will contact provider office for questions/concerns as evidenced notation of same in electronic health record through collaboration with PharmD and provider.   Interventions: 1:1 collaboration with LVenia Carbon MD regarding development and update of comprehensive plan of care as evidenced by provider attestation and co-signature Inter-disciplinary care team collaboration (see longitudinal plan of care) Comprehensive medication review performed; medication list updated in  electronic medical record  Hyperlipidemia: (LDL goal < 130) -Not ideally controlled - LDL 147 (12/2020) elevated w/ hx of aortic atherosclerosis on X-ray. Holding off on statin per PCP. -Current treatment: OTC Omega-3 fish oil - Appropriate, Effective, Safe, Accessible -Medications previously tried: none  -Educated on Cholesterol goals; Importance of limiting  foods high in cholesterol; -Counseled on diet and exercise extensively  Anxiety (Goal: manage symptoms) -Controlled - pt reports improvement in stress-related GI distress and hot flashes since starting escitalopram; she reports using alprazolam infrequently now -Current treatment: Alprazolam 0.25 mg 1/2-1 tab TID prn (rare use) - Appropriate, Effective, Safe, Accessible Escitalopram 5 mg daily - Appropriate, Effective, Safe, Accessible -Medications previously tried/failed: none -Reviewed benefits of escitalopram for anxiety; needing alprazolam less often is a clear sign that SSRI has been effective -Recommend to continue current medication  Hypothyroidism (Goal: maintain TSH in goal range) -Controlled - pt is taking levothyroxine with breakfast; TSH is low-normal -Current treatment  Levothyroxine 100 mcg daily - Appropriate, Effective, Safe, Accessible -Discussed levothyroxine is absorbed better on an empty stomach, however in her case this is not necessarily ideal as her most recent TSH was borderline hyperthyroid; hot flashes may be stemming from increasing thyroid activity due to improved absorption of levothyroxine on empty stomach -Recommend to continue current medication  GERD (Goal: manage symptoms) -Improved -pt reports food "piles up" when she is eating; she reports symptoms are improved with PPI and SSRI, she still has to take breaks while eating but overall this has improved -Pt previously stopped PPI d/t previous doctor warning of memory loss -Current treatment  Omeprazole 20 mg QOD - Appropriate, Effective, Query Safe,  Accessible Gas relief -Reviewed chronic PPI use is associated with decreased bone density; some studies have linked it to dementia as well, although consensus is unclear; in her case benefits > risks -Recommended to continue current medication   Patient Goals/Self-Care Activities Patient will:  - take medications as prescribed as evidenced by patient report and record review -focus on medication adherence by pill box -Resume taking levothyroxine with breakfast (as previous)     Medication Assistance: None required.  Patient affirms current coverage meets needs.  Compliance/Adherence/Medication fill history: Care Gaps: DEXA - performed 03/26/2016; indicative of osteopenia in hip  Star-Rating Drugs: None  Patient's preferred pharmacy is:  [facility] [place], [place] - [address] [place] [address] Phone: [phone] Fax: [phone]  Uses pill box? Yes Pt endorses 100% compliance  We discussed: Current pharmacy is preferred with insurance plan and patient is satisfied with pharmacy services Patient decided to: Continue current medication management strategy  Care Plan and Follow Up Patient Decision:  Patient agrees to Care Plan and Follow-up.  Plan: Telephone follow up appointment with care management team member scheduled for:  6 months  Charlene Brooke, PharmD, BCACP Clinical Pharmacist [facility] at [facility] [phone]

## 2021-08-16 NOTE — Patient Instructions (Signed)
Visit Information  Phone number for Pharmacist: [phone]   Goals Addressed   None     Care Plan : CCM Pharmacy Care Plan  Updates made by Kathyrn Sheriff, RPH since 08/16/2021 12:00 AM     Problem: Hyperlipidemia, GERD, Hypothyroidism, Anxiety, and Osteopenia   Priority: High     Long-Range Goal: Disease mgmt   Start Date: 04/18/2021  Expected End Date: 04/18/2022  Recent Progress: On track  Priority: High  Note:   Current Barriers:  None identified  Pharmacist Clinical Goal(s):  Patient will contact provider office for questions/concerns as evidenced notation of same in electronic health record through collaboration with PharmD and provider.   Interventions: 1:1 collaboration with Karie Schwalbe, MD regarding development and update of comprehensive plan of care as evidenced by provider attestation and co-signature Inter-disciplinary care team collaboration (see longitudinal plan of care) Comprehensive medication review performed; medication list updated in electronic medical record  Hyperlipidemia: (LDL goal < 130) -Not ideally controlled - LDL 147 (12/2020) elevated w/ hx of aortic atherosclerosis on X-ray. Holding off on statin per PCP. -Current treatment: OTC Omega-3 fish oil - Appropriate, Effective, Safe, Accessible -Medications previously tried: none  -Educated on Cholesterol goals; Importance of limiting foods high in cholesterol; -Counseled on diet and exercise extensively  Anxiety (Goal: manage symptoms) -Controlled - pt reports improvement in stress-related GI distress and hot flashes since starting escitalopram; she reports using alprazolam infrequently now -Current treatment: Alprazolam 0.25 mg 1/2-1 tab TID prn (rare use) - Appropriate, Effective, Safe, Accessible Escitalopram 5 mg daily - Appropriate, Effective, Safe, Accessible -Medications previously tried/failed: none -Reviewed benefits of escitalopram for anxiety; needing alprazolam less often  is a clear sign that SSRI has been effective -Recommend to continue current medication  Hypothyroidism (Goal: maintain TSH in goal range) -Controlled - pt is taking levothyroxine with breakfast; TSH is low-normal -Current treatment  Levothyroxine 100 mcg daily - Appropriate, Effective, Safe, Accessible -Discussed levothyroxine is absorbed better on an empty stomach, however in her case this is not necessarily ideal as her most recent TSH was borderline hyperthyroid; hot flashes may be stemming from increasing thyroid activity due to improved absorption of levothyroxine on empty stomach -Recommend to continue current medication  GERD (Goal: manage symptoms) -Improved -pt reports food "piles up" when she is eating; she reports symptoms are improved with PPI and SSRI, she still has to take breaks while eating but overall this has improved -Pt previously stopped PPI d/t previous doctor warning of memory loss -Current treatment  Omeprazole 20 mg QOD - Appropriate, Effective, Query Safe, Accessible Gas relief -Reviewed chronic PPI use is associated with decreased bone density; some studies have linked it to dementia as well, although consensus is unclear; in her case benefits > risks -Recommended to continue current medication   Patient Goals/Self-Care Activities Patient will:  - take medications as prescribed as evidenced by patient report and record review -focus on medication adherence by pill box -Resume taking levothyroxine with breakfast (as previous)      The patient verbalized understanding of instructions, educational materials, and care plan provided today and DECLINED offer to receive copy of patient instructions, educational materials, and care plan.  Telephone follow up appointment with pharmacy team member scheduled for: 6 months  Al Corpus, PharmD, [facility] Clinical Pharmacist [facility] at [facility] [phone]

## 2021-09-10 DIAGNOSIS — H2513 Age-related nuclear cataract, bilateral: Secondary | ICD-10-CM | POA: Diagnosis not present

## 2021-09-30 ENCOUNTER — Encounter: Payer: Self-pay | Admitting: Emergency Medicine

## 2021-09-30 ENCOUNTER — Ambulatory Visit
Admission: EM | Admit: 2021-09-30 | Discharge: 2021-09-30 | Disposition: A | Discharge status: Home / Self Care | Payer: Medicare Other | Attending: Emergency Medicine | Admitting: Emergency Medicine

## 2021-09-30 DIAGNOSIS — N3000 Acute cystitis without hematuria: Secondary | ICD-10-CM

## 2021-09-30 LAB — POCT URINALYSIS DIP (MANUAL ENTRY)
Bilirubin, UA: NEGATIVE
Glucose, UA: NEGATIVE mg/dL
Ketones, POC UA: NEGATIVE mg/dL
Nitrite, UA: POSITIVE — AB
Protein Ur, POC: 30 mg/dL — AB
Spec Grav, UA: 1.02 (ref 1.010–1.025)
Urobilinogen, UA: 0.2 E.U./dL
pH, UA: 6 (ref 5.0–8.0)

## 2021-09-30 MED ORDER — CEPHALEXIN 500 MG PO CAPS: 500.0000 mg | ORAL_CAPSULE | Freq: Two times a day (BID) | ORAL | 0 refills | Status: AC

## 2021-09-30 NOTE — Discharge Instructions (Addendum)
Your urinalysis shows Diana Owen blood cells and nitrates which are indicative of infection, your urine will be sent to the lab to determine exactly which bacteria is present, if any changes need to be made to your medications you will be notified  Begin use of Keflex every morning and every evening for 5 days  You may use over-the-counter Azo to help minimize your symptoms until antibiotic removes bacteria, this medication will turn your urine orange  Increase your fluid intake through use of water  As always practice good hygiene, wiping front to back and avoidance of scented vaginal products to prevent further irritation  If symptoms continue to persist after use of medication or recur please follow-up with urgent care or your primary doctor as needed

## 2021-09-30 NOTE — ED Triage Notes (Signed)
Pt presents with dysuria since yesterday and fatigue x 1 week.

## 2021-09-30 NOTE — ED Provider Notes (Signed)
Renaldo Fiddler    CSN: [id number] Arrival date & time: 09/30/21  1114      History   Chief Complaint Chief Complaint  Patient presents with   Dysuria   Fatigue    HPI Diana Owen is a 87 y.o. female.   Patient presents with with dysuria for 7 days and 2 to 3 days of increased fatigue.  dysuria has  described as a chilling sensation that goes throughout the body and not a burn.  Has urinary incontinence at baseline, unsure if she has been urinating more frequent or with urgency.  Denies lower abdominal pain or pressure, flank pain, fever, chills, hematuria, vaginal symptoms has not attempted treatment of symptoms.  Past Medical History:  Diagnosis Date   Anxiety    Arthritis    finger!!   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 if patient gets up during meal to let food pass, then n & v is not so bad   Midgut volvulus    SBO (small bowel obstruction) (HCC) 11/13/2016   Small bowel obstruction (HCC)    Small bowel obstruction due to postoperative adhesions 06/29/2017   Thyroid disease     Patient Active Problem List   Diagnosis Date Noted   COVID-19 07/23/2021   Acute viral sinusitis 07/22/2021   Right shoulder pain 05/30/2021   Mixed stress and urge urinary incontinence 06/26/2020   Scoliosis 06/26/2020   Aortic atherosclerosis (HCC) 12/16/2019   Mood disorder (HCC) 12/09/2018   Left hip pain 10/25/2018   Preventative health care 11/26/2017   Advance directive discussed with patient 11/26/2017   Other idiopathic scoliosis, thoracolumbar region 07/16/2016   Esophageal dysphagia 09/26/2015   Acid reflux 09/04/2015   Hyperlipidemia 09/04/2015   Hypothyroidism 09/04/2015    Past Surgical History:  Procedure Laterality Date   APPENDECTOMY  2002    LAPAROTOMY N/A 09/28/2017   Procedure: EXPLORATORY LAPAROTOMY WITH LYSIS OF ADHESIONS;  Surgeon: Ancil Linsey, MD;  Location: [facility] ORS;  Service: General;  Laterality: N/A;   PARTIAL COLECTOMY  2002   large polyp   TONSILLECTOMY     TUBAL LIGATION      OB History   No obstetric history on file.      Home Medications    Prior to Admission medications   Medication Sig Start Date End Date Taking? Authorizing Provider  cephALEXin (KEFLEX) 500 MG capsule Take 1 capsule (500 mg total) by mouth 2 (two) times daily for 5 days. 09/30/21 10/05/21 Yes Frenchie Pribyl, Elita Boone, NP  ALPRAZolam (XANAX) 0.25 MG tablet TAKE 1/2 TO 1 TABLET(0.125 TO 0.25 MG) BY MOUTH THREE TIMES DAILY AS NEEDED FOR ANXIETY 07/09/21   Tillman Abide I, MD  cholecalciferol (VITAMIN D) 1000 units tablet Take 1,000 Units by mouth daily.    [provider]  Cyanocobalamin (VITAMIN B-12) 6000 MCG SUBL Place 1 tablet under the tongue daily.    [provider]  escitalopram (LEXAPRO) 5 MG tablet Take 1 tablet (5 mg total) by mouth every other day. 05/30/21   Karie Schwalbe, MD  levothyroxine (SYNTHROID) 100 MCG tablet TAKE 1 TABLET(100 MCG) BY MOUTH DAILY 04/22/21   Karie Schwalbe, MD  Multiple Vitamins-Minerals (MULTIVITAMIN WITH MINERALS) tablet Take 1 tablet by mouth daily.    [provider]  Omega-3 Fatty Acids (FISH OIL PO) Take by mouth.    [provider]  omeprazole (PRILOSEC) 20 MG capsule Take 1 capsule (20 mg total) by mouth every other day. 03/26/21   Karie Schwalbe, MD  polyethylene glycol (MIRALAX / Ethelene Hal) packet Take 17 g by mouth daily.    [provider]  simethicone (MYLICON) 80 MG chewable tablet Chew 80 mg by mouth every 6 (six) hours as needed for flatulence.    [provider]  vitamin E 100 UNIT capsule Take 100 Units by mouth daily.    [provider]    Family History Family History  Problem Relation Age of Onset   Leukemia  Mother        CLL   Leukemia Father    Hyperlipidemia Brother    Stroke Brother    Heart disease Paternal Uncle    Diabetes Neg Hx     Social History Social History   Tobacco Use   Smoking status: Never   Smokeless tobacco: Never  Vaping Use   Vaping Use: Never used  Substance Use Topics   Alcohol use: Yes    Comment: rare wine    Drug use: No     Allergies   Codeine, Adhesive [tape], and Latex   Review of Systems Review of Systems  Constitutional: Negative.   HENT: Negative.    Respiratory: Negative.    Cardiovascular: Negative.   Genitourinary:  Positive for dysuria. Negative for decreased urine volume, difficulty urinating, dyspareunia, enuresis, flank pain, frequency, genital sores, hematuria, menstrual problem, pelvic pain, urgency, vaginal bleeding, vaginal discharge and vaginal pain.  Skin: Negative.      Physical Exam Triage Vital Signs ED Triage Vitals  Enc Vitals Group     BP 09/30/21 1156 115/74     Pulse Rate 09/30/21 1156 79     Resp 09/30/21 1156 16     Temp 09/30/21 1156 99 F (37.2 C)     Temp Source 09/30/21 1156 Oral     SpO2 09/30/21 1156 95 %     Weight --      Height --      Head Circumference --      Peak Flow --      Pain Score 09/30/21 1157 0     Pain Loc --      Pain Edu? --      Excl. in GC? --    No data found.  Updated Vital Signs BP 115/74 (BP Location: Right Arm)   Pulse 79   Temp 99 F (37.2 C) (Oral)   Resp 16   SpO2 95%   Visual Acuity Right Eye Distance:   Left Eye Distance:   Bilateral Distance:    Right Eye Near:   Left Eye Near:    Bilateral Near:     Physical Exam Constitutional:      Appearance: Normal appearance.  Eyes:     Extraocular Movements: Extraocular movements intact.  Pulmonary:     Effort: Pulmonary effort is normal.  Abdominal:     General: Abdomen is flat. Bowel sounds are normal.     Palpations: Abdomen is soft.     Tenderness: There is abdominal tenderness in the suprapubic  area.  Neurological:     Mental Status: She is alert and oriented to person, place, and time. Mental status is at baseline.      UC Treatments / Results  Labs (all labs ordered are listed, but only abnormal  results are displayed) Labs Reviewed  POCT URINALYSIS DIP (MANUAL ENTRY) - Abnormal; Notable for the following components:      Result Value   Color, UA other (*)    Clarity, UA cloudy (*)    Blood, UA moderate (*)    Protein Ur, POC =30 (*)    Nitrite, UA Positive (*)    Leukocytes, UA Moderate (2+) (*)    All other components within normal limits  URINE CULTURE    EKG   Radiology No results found.  Procedures Procedures (including critical care time)  Medications Ordered in UC Medications - No data to display  Initial Impression / Assessment and Plan / UC Course  I have reviewed the triage vital signs and the nursing notes.  Pertinent labs & imaging results that were available during my care of the patient were reviewed by me and considered in my medical decision making (see chart for details).  Acute cystitis without hematuria  Urinalysis showing Kewanda Poland blood cells and nitrates, sent for culture, discussed with patient, Keflex 5-day course prescribed and recommended over-the-counter analgesics or Pyridium for management of discomfort increase fluid intake and good hygiene for additional support, given strict precautions for symptoms that persist or worsen.  To follow-up with urgent care for reevaluation Final Clinical Impressions(s) / UC Diagnoses   Final diagnoses:  Acute cystitis without hematuria     Discharge Instructions      Your urinalysis shows Norelle Runnion blood cells and nitrates which are indicative of infection, your urine will be sent to the lab to determine exactly which bacteria is present, if any changes need to be made to your medications you will be notified  Begin use of Keflex every morning and every evening for 5 days  You may use  over-the-counter Azo to help minimize your symptoms until antibiotic removes bacteria, this medication will turn your urine orange  Increase your fluid intake through use of water  As always practice good hygiene, wiping front to back and avoidance of scented vaginal products to prevent further irritation  If symptoms continue to persist after use of medication or recur please follow-up with urgent care or your primary doctor as needed    ED Prescriptions     Medication Sig Dispense Auth. Provider   cephALEXin (KEFLEX) 500 MG capsule Take 1 capsule (500 mg total) by mouth 2 (two) times daily for 5 days. 10 capsule Valinda Hoar, NP      PDMP not reviewed this encounter.   Valinda Hoar, NP 09/30/21 1210

## 2021-10-02 LAB — URINE CULTURE: Culture: >=100000 — AB

## 2021-10-09 ENCOUNTER — Telehealth: Payer: Self-pay | Admitting: Internal Medicine

## 2021-10-09 NOTE — Telephone Encounter (Signed)
I spoke with pt and she said she does not think she is bad enough to go to ED or UC. Pt said she already has appt to see Dr Patsy Lager on 10/10/21 at 8:40. Pt said she has not fallen and no known injury. Pt said has been doing floor exercises where she bends her knees and goes from side to side but pt has been doing that for awhile without any problem. UC & ED precautions given and pt voiced understanding. Sending note to Dr Patsy Lager and Lupita Leash CMA.

## 2021-10-09 NOTE — Telephone Encounter (Signed)
[facility] Day - Client TELEPHONE ADVICE RECORD [facility] Patient Name: CRIS GIBBY [facility] Gender: Female DOB: February 15, 1935 Age: 87 Y 5 M 13 D Return Phone Number: [phone] (Primary) Address: City/ State/ Zip: [place]  [address] Client [facility] - Client Client Site [facility] - Day Provider Tillman Abide- MD Contact Type Call Who Is Calling Patient / Member / Family / Caregiver Call Type Triage / Clinical Relationship To Patient Self Return Phone Number [phone] (Primary) Chief Complaint CHEST PAIN - pain, pressure, heaviness or tightness Reason for Call Symptomatic / Request for Health Information Initial Comment Having pain on the back corner of her rib, hard to move. Translation No Nurse Assessment Nurse: Daphine Deutscher, RN, Cala Bradford Date/Time Lamount Cohen Time): 10/09/2021 8:31:57 AM Confirm and document reason for call. If symptomatic, describe symptoms. ---caller states she has pain in her back rib which is making it painful to move and breathe. no fever. Does the patient have any new or worsening symptoms? ---Yes Will a triage be completed? ---Yes Related visit to physician within the last 2 weeks? ---No Does the PT have any chronic conditions? (i.e. diabetes, asthma, this includes High risk factors for pregnancy, etc.) ---Yes List chronic conditions. ---indigestion, thyroid disease Is this a behavioral health or substance abuse call? ---No Guidelines Guideline Title Affirmed Question Affirmed Notes Nurse Date/Time Lamount Cohen Time) Chest Pain [1] Chest pain lasts > 5 minutes AND [2] age > 54 Abram Sander 10/09/2021 8:35:03 AM Disp. Time Lamount Cohen Time) Disposition Final User 10/09/2021 8:27:58 AM Send to Urgent Carmelina Noun 10/09/2021 8:36:49 AM Call EMS 911 Now Yes Daphine Deutscher, RN, Cala Bradford 10/09/2021 8:40:02 AM 911 Outcome Documentation Daphine Deutscher, RN, Cala Bradford PLEASE NOTE:  All timestamps contained within this report are represented as [place] Standard Time. CONFIDENTIALTY NOTICE: This fax transmission is intended only for the addressee. It contains information that is legally privileged, confidential or otherwise protected from use or disclosure. If you are not the intended recipient, you are strictly prohibited from reviewing, disclosing, copying using or disseminating any of this information or taking any action in reliance on or regarding this information. If you have received this fax in error, please notify us immediately by telephone so that we can arrange for its return to [place]. Phone: [phone], Toll-Free: [phone], Fax: [phone] Page: 2 of 2 Call Id: [id number] Disp. Time Lamount Cohen Time) Disposition Final User Reason: refused. transferred to office staff Final Disposition 10/09/2021 8:36:49 AM Call EMS 911 Now Yes Daphine Deutscher, RN, Anders Simmonds Disagree/Comply Comply Caller Understands Yes PreDisposition Call Doctor Care Advice Given Per Guideline CALL EMS 911 NOW: * Immediate medical attention is needed. You need to hang up and call 911 (or an ambulance). * Triager Discretion: I'll call you back in a few minutes to be sure you were able to reach them. CARE ADVICE given per Chest Pain (Adult) guideline

## 2021-10-09 NOTE — Telephone Encounter (Signed)
Patient called and stated she has a swollen back and sore rib. Pain level is a 7. Also when she twist a certain way she has pain. Patient was sent to access nurse.

## 2021-10-10 ENCOUNTER — Ambulatory Visit: Payer: Medicare Other | Admitting: Family Medicine

## 2021-10-14 ENCOUNTER — Encounter: Payer: Self-pay | Admitting: Family Medicine

## 2021-10-14 ENCOUNTER — Ambulatory Visit (INDEPENDENT_AMBULATORY_CARE_PROVIDER_SITE_OTHER): Payer: Medicare Other | Admitting: Family Medicine

## 2021-10-14 VITALS — BP 100/70 | HR 76 | Temp 98.4°F | Ht 61.0 in | Wt 120.4 lb

## 2021-10-14 DIAGNOSIS — R1011 Right upper quadrant pain: Secondary | ICD-10-CM

## 2021-10-14 LAB — CBC WITH DIFFERENTIAL/PLATELET
Basophils Absolute: 0 10*3/uL (ref 0.0–0.1)
Basophils Relative: 0.5 % (ref 0.0–3.0)
Eosinophils Absolute: 0.1 10*3/uL (ref 0.0–0.7)
Eosinophils Relative: 1.7 % (ref 0.0–5.0)
HCT: 40.8 % (ref 36.0–46.0)
Hemoglobin: 14 g/dL (ref 12.0–15.0)
Lymphocytes Relative: 10.4 % — ABNORMAL LOW (ref 12.0–46.0)
Lymphs Abs: 0.9 10*3/uL (ref 0.7–4.0)
MCHC: 34.2 g/dL (ref 30.0–36.0)
MCV: 90 fl (ref 78.0–100.0)
Monocytes Absolute: 0.8 10*3/uL (ref 0.1–1.0)
Monocytes Relative: 9.4 % (ref 3.0–12.0)
Neutro Abs: 6.4 10*3/uL (ref 1.4–7.7)
Neutrophils Relative %: 78 % — ABNORMAL HIGH (ref 43.0–77.0)
Platelets: 214 10*3/uL (ref 150.0–400.0)
RBC: 4.53 Mil/uL (ref 3.87–5.11)
RDW: 13.9 % (ref 11.5–15.5)
WBC: 8.2 10*3/uL (ref 4.0–10.5)

## 2021-10-14 LAB — BASIC METABOLIC PANEL
BUN: 15 mg/dL (ref 6–23)
CO2: 28 mEq/L (ref 19–32)
Calcium: 9.2 mg/dL (ref 8.4–10.5)
Chloride: 102 mEq/L (ref 96–112)
Creatinine, Ser: 0.59 mg/dL (ref 0.40–1.20)
GFR: 82.16 mL/min (ref >60.00)
Glucose, Bld: 97 mg/dL (ref 70–99)
Potassium: 4 mEq/L (ref 3.5–5.1)
Sodium: 138 mEq/L (ref 135–145)

## 2021-10-14 LAB — HEPATIC FUNCTION PANEL
ALT: 15 U/L (ref 0–35)
AST: 19 U/L (ref 0–37)
Albumin: 4.1 g/dL (ref 3.5–5.2)
Alkaline Phosphatase: 72 U/L (ref 39–117)
Bilirubin, Direct: 0.1 mg/dL (ref 0.0–0.3)
Total Bilirubin: 0.8 mg/dL (ref 0.2–1.2)
Total Protein: 6.7 g/dL (ref 6.0–8.3)

## 2021-10-14 LAB — LIPASE: Lipase: 31 U/L (ref 11.0–59.0)

## 2021-10-14 NOTE — Progress Notes (Signed)
Lavaughn Bisig T. Kiannah Grunow, MD, [place] at [facility] [place], [address]  Phone: [phone]  FAX: [phone]  Diana Owen - 87 y.o. female  MRN [id number]  Date of Birth: [birth year]  Date: 10/14/2021  PCP: Venia Carbon, MD  Referral: Venia Carbon, MD  Chief Complaint  Patient presents with   Beatrix Shipper Pain   Subjective:   Krysty Hulke is a 87 y.o. very pleasant female patient with Body mass index is 22.74 kg/m. who presents with the following:  Pleasant elderly female who has been having some right upper quadrant abdominal pain for 1 week.  This is escalated in pain, and she has had 2 distinct episodes that were quite painful.  She had for some abdominal pain in the right upper quadrant last Monday, and then she notes that this was after eating something heavy.  It did improve somewhat throughout the course the week, but then again on Saturday after eating breakfast she had some severe abdominal pain.  This is the first time she has seen medical attention for this.  She did rest a lot and was in bed much of the last few days and it did improve at least during last week.  At the end of the week.  Since Saturday, she has had a liquid diet.  She has been able to tolerate this without any nausea, vomiting, diarrhea.  She does not have any blood in her stool.   Appy, partial colectomy, ex lab with lysis of adhesions in 2019.  Also with a history of BTL.  Has been having a liquid diet. No fever, none in recent days.   No other URI symptoms or other ongoing aches, pains, or other infectious symptoms.  Review of Systems is noted in the HPI, as appropriate  Patient Active Problem List   Diagnosis Date Noted   COVID-19 07/23/2021   Acute viral sinusitis 07/22/2021   Right shoulder pain 05/30/2021   Mixed stress and urge urinary incontinence 06/26/2020   Scoliosis 06/26/2020   Aortic atherosclerosis ([place])  12/16/2019   Mood disorder ([place]) 12/09/2018   Left hip pain 10/25/2018   Preventative health care 11/26/2017   Advance directive discussed with patient 11/26/2017   Other idiopathic scoliosis, thoracolumbar region 07/16/2016   Esophageal dysphagia 09/26/2015   Acid reflux 09/04/2015   Hyperlipidemia 09/04/2015   Hypothyroidism 09/04/2015    Past Medical History:  Diagnosis Date   Anxiety    Arthritis    finger!!   Barrett's esophagus 2000   GERD (gastroesophageal reflux disease)    History of hiatal hernia    Hypothyroidism    Intestinal adhesions with complete obstruction ([place]) 04/25/2017   Intractable vomiting with nausea    occurs with each obstructive episode. if patient gets up during meal to let food pass, then n & v is not so bad   Midgut volvulus    SBO (small bowel obstruction) ([place]) 11/13/2016   Small bowel obstruction ([place])    Small bowel obstruction due to postoperative adhesions 06/29/2017   Thyroid disease     Past Surgical History:  Procedure Laterality Date   APPENDECTOMY  2002   LAPAROTOMY N/A 09/28/2017   Procedure: EXPLORATORY LAPAROTOMY WITH LYSIS OF ADHESIONS;  Surgeon: Vickie Epley, MD;  Location: [facility] ORS;  Service: General;  Laterality: N/A;   PARTIAL COLECTOMY  2002   large polyp   TONSILLECTOMY     TUBAL LIGATION  Family History  Problem Relation Age of Onset   Leukemia Mother        CLL   Leukemia Father    Hyperlipidemia Brother    Stroke Brother    Heart disease Paternal Uncle    Diabetes Neg Hx     Social History   Social History Narrative   2 sons, 1 daughter--- Brett Canales in [place] in [place], Selinda Orion in [place]      Has a living will   Daughter [name], then sons, should make health care decisions   Would accept resuscitation attempts   Not sure about tube feeds     Objective:   BP 100/70   Pulse 76   Temp 98.4 F (36.9 C) (Oral)   Ht 5\' 1"  (1.549 m)   Wt 120 lb 6 oz (54.6 kg)   SpO2 95%   BMI 22.74  kg/m   GEN: No acute distress; alert,appropriate. PULM: Breathing comfortably in no respiratory distress PSYCH: Normally interactive.  CV: RRR, no m/g/r  PULM: Normal respiratory rate, no accessory muscle use. No wheezes, crackles or rhonchi   ABD: S, + RUQ pain on palpation, mild distension, + BS, No rebound, No HSM   Laboratory and Imaging Data:  Assessment and Plan:     ICD-10-CM   1. RUQ pain  R10.11 CBC with Differential/Platelet    Hepatic function panel    Basic metabolic panel    Lipase    ABDOMEN LIMITED RUQ (LIVER/GB)     Right upper quadrant pain that had been quite severe on Saturday, still persistent today.  Patient does have right upper quadrant pain on exam, by history, she also has some abdominal distention.  For now, assess liver function, pancreatic function. We will check all laboratory stat including a blood count and a renal panel.  Obtain a right upper quadrant ultrasound to evaluate for cholecystitis.  We will also arrange this stat.  If positive, she will need emergent medical care in the hospital.  Medication Management during today's office visit: No orders of the defined types were placed in this encounter.  Medications Discontinued During This Encounter  Medication Reason   omeprazole (PRILOSEC) 20 MG capsule Duplicate    Orders placed today for conditions managed today: Orders Placed This Encounter  Procedures   Sunday ABDOMEN LIMITED RUQ (LIVER/GB)   CBC with Differential/Platelet   Hepatic function panel   Basic metabolic panel   Lipase    Follow-up if needed: No follow-ups on file.  Dr[employer] One speech-to-text software was used for transcription in this dictation.  Possible transcriptional errors can occur using [place].   Signed,  Animal nutritionist. Uchechi Denison, MD   Outpatient Encounter Medications as of 10/14/2021  Medication Sig   omeprazole (PRILOSEC) 20 MG capsule Take 20 mg by mouth daily.   ALPRAZolam (XANAX) 0.25 MG  tablet TAKE 1/2 TO 1 TABLET(0.125 TO 0.25 MG) BY MOUTH THREE TIMES DAILY AS NEEDED FOR ANXIETY   cholecalciferol (VITAMIN D) 1000 units tablet Take 1,000 Units by mouth daily.   Cyanocobalamin (VITAMIN B-12) 6000 MCG SUBL Place 1 tablet under the tongue daily.   escitalopram (LEXAPRO) 5 MG tablet Take 1 tablet (5 mg total) by mouth every other day.   levothyroxine (SYNTHROID) 100 MCG tablet TAKE 1 TABLET(100 MCG) BY MOUTH DAILY   Multiple Vitamins-Minerals (MULTIVITAMIN WITH MINERALS) tablet Take 1 tablet by mouth daily.   Omega-3 Fatty Acids (FISH OIL PO) Take by mouth.   polyethylene  glycol (MIRALAX / GLYCOLAX) packet Take 17 g by mouth daily.   PREVIDENT 5000 PLUS 1.1 % CREA dental cream SMARTSIG:Application By Mouth   simethicone (MYLICON) 80 MG chewable tablet Chew 80 mg by mouth every 6 (six) hours as needed for flatulence.   vitamin E 100 UNIT capsule Take 100 Units by mouth daily.   [DISCONTINUED] omeprazole (PRILOSEC) 20 MG capsule Take 1 capsule (20 mg total) by mouth every other day. (Patient taking differently: Take 20 mg by mouth daily.)   No facility-administered encounter medications on file as of 10/14/2021.

## 2021-10-15 ENCOUNTER — Ambulatory Visit
Admission: RE | Admit: 2021-10-15 | Discharge: 2021-10-15 | Disposition: A | Discharge status: Home / Self Care | Payer: Medicare Other | Source: Ambulatory Visit | Attending: Family Medicine | Admitting: Family Medicine

## 2021-10-15 DIAGNOSIS — R1011 Right upper quadrant pain: Secondary | ICD-10-CM | POA: Diagnosis not present

## 2021-10-21 ENCOUNTER — Telehealth: Payer: Self-pay | Admitting: Internal Medicine

## 2021-10-21 NOTE — Telephone Encounter (Signed)
Noted  

## 2021-10-21 NOTE — Telephone Encounter (Signed)
Diana Owen, CMA  10/16/2021  1:55 PM EDT     [name] notified as instructed by telephone.  She is feeling some better.   She will continue to observe her symptoms and will call us back if she decides she would like to move forward with the CT.     Hannah Beat, MD  10/16/2021  1:07 PM EDT     Can you double check with her?   Her ultrasound looks normal and there are no significant gallstones, and her gallbladder is definitely not infected.  If she is still having a lot of pain like she was, I think getting a CT of the abdomen and pelvis would be reasonable to help identify cause.   If she feels completely better, then I think that she would be okay just continuing to observe things carefully.

## 2021-10-21 NOTE — Telephone Encounter (Signed)
Patient called in and stated she received a call about her results. Stated that whoever had called her recommended her getting a CT Scan and she wanted to follow up on this. Thank you!

## 2021-11-05 ENCOUNTER — Ambulatory Visit (INDEPENDENT_AMBULATORY_CARE_PROVIDER_SITE_OTHER): Payer: Medicare Other | Admitting: Family Medicine

## 2021-11-05 ENCOUNTER — Ambulatory Visit: Payer: Medicare Other | Admitting: Family Medicine

## 2021-11-05 VITALS — BP 100/60 | HR 71 | Temp 97.5°F | Wt 122.0 lb

## 2021-11-05 DIAGNOSIS — J0100 Acute maxillary sinusitis, unspecified: Secondary | ICD-10-CM

## 2021-11-05 MED ORDER — AMOXICILLIN-POT CLAVULANATE 875-125 MG PO TABS: 1.0000 | ORAL_TABLET | Freq: Two times a day (BID) | ORAL | 0 refills | Status: AC

## 2021-11-05 NOTE — Patient Instructions (Addendum)
Sinus infection - try Flonase - nasal steroid - antibiotic take with food - eat yogurt or a probiotic to help with diarrhea  Return if new shortness of breath, wheezing, worsening cough or fevers  Call if no improvement in 2 days and can consider chest x-ray

## 2021-11-05 NOTE — Progress Notes (Unsigned)
Subjective:     Diana Owen is a 87 y.o. female presenting for Nasal Congestion (And drainage post covid 2 months, causing her to cough. ) and Sinusitis (Pressure in the maxillary sinuses )     Sinusitis This is a new problem. The current episode started more than 1 month ago. The problem has been gradually worsening since onset. There has been no fever. Associated symptoms include congestion, coughing, sinus pressure and sneezing. Pertinent negatives include no chills, ear pain, shortness of breath or sore throat. Past treatments include nothing.     Review of Systems  Constitutional:  Negative for chills.  HENT:  Positive for congestion, sinus pressure and sneezing. Negative for ear pain and sore throat.   Respiratory:  Positive for cough. Negative for shortness of breath.      Social History   Tobacco Use  Smoking Status Never  Smokeless Tobacco Never        Objective:    BP Readings from Last 3 Encounters:  11/05/21 100/60  10/14/21 100/70  09/30/21 115/74   Wt Readings from Last 3 Encounters:  11/05/21 122 lb (55.3 kg)  10/14/21 120 lb 6 oz (54.6 kg)  07/22/21 126 lb (57.2 kg)    BP 100/60   Pulse 71   Temp (!) 97.5 F (36.4 C) (Temporal)   Wt 122 lb (55.3 kg)   SpO2 96%   BMI 23.05 kg/m    Physical Exam Constitutional:      General: She is not in acute distress.    Appearance: She is well-developed. She is not diaphoretic.  HENT:     Head: Normocephalic and atraumatic.     Right Ear: Tympanic membrane and ear canal normal.     Left Ear: Tympanic membrane and ear canal normal.     Nose: Mucosal edema, congestion and rhinorrhea present.     Right Sinus: Maxillary sinus tenderness present. No frontal sinus tenderness.     Left Sinus: Maxillary sinus tenderness present. No frontal sinus tenderness.     Mouth/Throat:     Pharynx: Uvula midline. Posterior oropharyngeal erythema present. No oropharyngeal exudate.     Tonsils: 0 on the right. 0 on  the left.  Eyes:     General: No scleral icterus.    Conjunctiva/sclera: Conjunctivae normal.  Cardiovascular:     Rate and Rhythm: Normal rate and regular rhythm.     Heart sounds: Normal heart sounds. No murmur heard. Pulmonary:     Effort: Pulmonary effort is normal. No respiratory distress.     Breath sounds: Rales present.  Musculoskeletal:     Cervical back: Neck supple.  Lymphadenopathy:     Cervical: No cervical adenopathy.  Skin:    General: Skin is warm and dry.     Capillary Refill: Capillary refill takes less than 2 seconds.  Neurological:     Mental Status: She is alert.           Assessment & Plan:   Problem List Items Addressed This Visit   None Visit Diagnoses     Acute non-recurrent maxillary sinusitis    -  Primary   Relevant Medications   amoxicillin-clavulanate (AUGMENTIN) 875-125 MG tablet      Persistent and worsening symptoms since covid. Treat as sinus infection. Some crackles but no significant cough or new fever.   Discussed XR today vs watch and wait. We will wait.   She will update or return if new fevers/worsening cough. XR if no improvement.  Return if symptoms worsen or fail to improve.  Lynnda Child, MD

## 2021-12-03 ENCOUNTER — Telehealth: Payer: Self-pay

## 2021-12-03 NOTE — Chronic Care Management (AMB) (Signed)
Chronic Owen Management Pharmacy Assistant   Name: Diana Owen  MRN: [id number] DOB: 21-Jun-1934  Reason for Encounter: General  Adherence  Recent office visits:  11/05/21-Diana Cody,MD(fam med)-nasal congestion,sinusitis,start augmentin 875-125mg -- try Flonase - nasal steroid, call if no better in a few days  10/14/21-Diana Copland,MD(fam med)-RUQ pain,labs(no notes) stat US abdomen,start Omeprazole 20mg  take 1 tablet daily   Recent consult visits:  None since last CCM contact  Hospital visits:  None in previous 6 months 09/30/21-Diana Owen [place]-Acute cystitis,Keflex 500mg - 5-day course prescribed and recommended over-the-counter analgesics or Pyridium for management of discomfort increase fluid intake- no admission  Medications: Outpatient Encounter Medications as of 12/03/2021  Medication Sig   ALPRAZolam (XANAX) 0.25 MG tablet TAKE 1/2 TO 1 TABLET(0.125 TO 0.25 MG) BY MOUTH THREE TIMES DAILY AS NEEDED FOR ANXIETY   cholecalciferol (VITAMIN D) 1000 units tablet Take 1,000 Units by mouth daily.   Cyanocobalamin (VITAMIN B-12) 6000 MCG SUBL Place 1 tablet under the tongue daily.   escitalopram (LEXAPRO) 5 MG tablet Take 1 tablet (5 mg total) by mouth every other day.   levothyroxine (SYNTHROID) 100 MCG tablet TAKE 1 TABLET(100 MCG) BY MOUTH DAILY   Multiple Vitamins-Minerals (MULTIVITAMIN WITH MINERALS) tablet Take 1 tablet by mouth daily.   Omega-3 Fatty Acids (FISH OIL PO) Take by mouth.   omeprazole (PRILOSEC) 20 MG capsule Take 20 mg by mouth daily.   polyethylene glycol (MIRALAX / GLYCOLAX) packet Take 17 g by mouth daily.   PREVIDENT 5000 PLUS 1.1 % CREA dental cream SMARTSIG:Application By Mouth   simethicone (MYLICON) 80 MG chewable tablet Chew 80 mg by mouth every 6 (six) hours as needed for flatulence.   vitamin E 100 UNIT capsule Take 100 Units by mouth daily.   No facility-administered encounter medications on file as of 12/03/2021.    Recent Office  Vitals: BP Readings from Last 3 Encounters:  11/05/21 100/60  10/14/21 100/70  09/30/21 115/74   Pulse Readings from Last 3 Encounters:  11/05/21 71  10/14/21 76  09/30/21 79    Wt Readings from Last 3 Encounters:  11/05/21 122 lb (55.3 kg)  10/14/21 120 lb 6 oz (54.6 kg)  07/22/21 126 lb (57.2 kg)     Kidney Function Lab Results  Component Value Date/Time   CREATININE 0.59 10/14/2021 11:23 AM   CREATININE 0.64 01/01/2021 11:47 AM   GFR 82.16 10/14/2021 11:23 AM   GFRNONAA >60 09/30/2017 04:25 AM   GFRAA >60 09/30/2017 04:25 AM       Latest Ref Rng & Units 10/14/2021   11:23 AM 01/01/2021   11:47 AM 12/16/2019   11:32 AM  BMP  Glucose 70 - 99 mg/dL 97  79  94   BUN 6 - 23 mg/dL 15  16  18    Creatinine 0.40 - 1.20 mg/dL 01/03/2021  02/15/2020    Sodium 135 - 145 mEq/L 138  141  140   Potassium 3.5 - 5.1 mEq/L 4.0  4.2  3.9   Chloride 96 - 112 mEq/L 102  101  101   CO2 19 - 32 mEq/L 28  32  30   Calcium 8.4 - 10.5 mg/dL 9.2  9.7  9.5      Contacted Diana Owen on 12/09/21 for general disease state and medication adherence call.   Patient is not more than 5 days past due for refill on the following medications per chart history:  Star Medications: Medication Name/mg Last Fill Days Supply No star  medications identified   What concerns do you have about your medications? No concerns at this time   The patient denies side effects with their medications.   How often do you forget or accidentally miss a dose? Never  Do you use a pillbox? Yes  patient puts out 1 week at a time   Are you having any problems getting your medications from your pharmacy? No  Has the cost of your medications been a concern? No   Since last visit with CPP, no interventions have been made.   The patient has had an ED visit since last contact. 09/30/21 Diana Owen,Diana Owen-acute cystitis-Urinalysis showing white blood cells and nitrates, sent for culture, discussed with patient, Keflex  5-day course prescribed and recommended over-the-counter analgesics or Pyridium for management of discomfort increase fluid intake  The patient denies problems with their health.   Patient denies concerns or questions for Diana Owen, PharmD at this time.   Counseled patient on:  [place] job taking medications, Importance of taking medication daily without missed doses, Benefits of adherence packaging or a pillbox, and Access to CCM team for any cost, medication or pharmacy concerns.   Owen Gaps: Annual wellness visit in last year? Yes Most Recent BP reading:100/60  11/05/21  If Diabetic: Most recent A1C reading: Last eye exam / retinopathy screening: Last diabetic foot exam:  Summary of recommendations from last CCM Pharmacy visit (Date:08/16/21)   Summary: CCM F/U visit -Reviewed medications; pt affirms adherence as prescribed -Pt reports overall improvement in anxiety and GI symptoms with escitalopram and omeprazole every other day dosing   Recommendations/Changes made from today's visit: -No med changes    PCP appointment on 01/01/22 and CCM appointment on 02/12/22   Diana Owen, CPP notified  Diana Owen, [place]  [phone]

## 2022-01-01 ENCOUNTER — Ambulatory Visit (INDEPENDENT_AMBULATORY_CARE_PROVIDER_SITE_OTHER): Payer: Medicare Other | Admitting: Internal Medicine

## 2022-01-01 ENCOUNTER — Encounter: Payer: Self-pay | Admitting: Internal Medicine

## 2022-01-01 VITALS — BP 118/78 | HR 84 | Temp 97.9°F | Ht 61.0 in | Wt 124.0 lb

## 2022-01-01 DIAGNOSIS — K2100 Gastro-esophageal reflux disease with esophagitis, without bleeding: Secondary | ICD-10-CM | POA: Diagnosis not present

## 2022-01-01 DIAGNOSIS — Z23 Encounter for immunization: Secondary | ICD-10-CM | POA: Diagnosis not present

## 2022-01-01 DIAGNOSIS — E039 Hypothyroidism, unspecified: Secondary | ICD-10-CM

## 2022-01-01 DIAGNOSIS — I700 Atherosclerosis of aorta: Secondary | ICD-10-CM | POA: Diagnosis not present

## 2022-01-01 DIAGNOSIS — F39 Unspecified mood [affective] disorder: Secondary | ICD-10-CM | POA: Diagnosis not present

## 2022-01-01 DIAGNOSIS — Z0000 Encounter for general adult medical examination without abnormal findings: Secondary | ICD-10-CM

## 2022-01-01 DIAGNOSIS — M4125 Other idiopathic scoliosis, thoracolumbar region: Secondary | ICD-10-CM

## 2022-01-01 NOTE — Assessment & Plan Note (Signed)
Seems euthyroid with levothyroxine 100 Defer labs since taking biotin

## 2022-01-01 NOTE — Progress Notes (Signed)
Hearing Screening - Comments:: Passed whisper test Vision Screening - Comments:: September 2023  

## 2022-01-01 NOTE — Assessment & Plan Note (Signed)
Quiet again with the daily omeprazole 20mg 

## 2022-01-01 NOTE — Assessment & Plan Note (Signed)
Chronic anxiety and mild dysthymia Does well with escitalopram 5mg  and prn alprazolam

## 2022-01-01 NOTE — Addendum Note (Signed)
Addended by: Pilar Grammes on: 01/01/2022 11:41 AM   Modules accepted: Orders

## 2022-01-01 NOTE — Progress Notes (Signed)
Subjective:    Patient ID: Diana Owen, female    DOB: 04/17/1934, 87 y.o.   MRN: [id number]  HPI Here for Medicare wellness visit and follow up of chronic health conditions Reviewed advanced directives Reviewed other doctors---Dr Maklouf---dentist, [facility], Dr Ellin Mayhew No hospitalizations or surgery this year Vision is okay Hearing is good Still enjoys a little wine in evening No tobacco Walks most days (with rollator)--occasionally goes to gym No falls Independent with instrumental ADLs. Monthly housekeeping Mild memory issues--nothing worrisome  Still with some mood problems Will have panic symptoms at times---like before coming here Uses the xanax prn Still on escitalopram--feels this helps for focus and stress Only occasional depressed mood---can distract herself with walking or activities Not anhedonic---activities at [facility] Some chronic shoulder issues  Has discomfort on right side---goes to back Comes on with roughage---if she doesn't drink enough before Ultrasound was benign Bowels move okay with daily miralax (1/4 dose) Is back on omeprazole--back to every day (feels better) Heartburn controlled and no dysphagia on this  Has occasional left ear pain Wonders if something is left over from COVID and sinus infection (still some drainage)  Known aortic atherosclerosis No Rx  No chest pain No dizziness or syncope No edema No SOB--stamina is stable  Current Outpatient Medications on File Prior to Visit  Medication Sig Dispense Refill   ALPRAZolam (XANAX) 0.25 MG tablet TAKE 1/2 TO 1 TABLET(0.125 TO 0.25 MG) BY MOUTH THREE TIMES DAILY AS NEEDED FOR ANXIETY 30 tablet 0   Ascorbic Acid (VITAMIN C) 100 MG tablet Take 100 mg by mouth daily.     BIOTIN PO Take by mouth.     cholecalciferol (VITAMIN D) 1000 units tablet Take 1,000 Units by mouth daily.     Cyanocobalamin (VITAMIN B-12) 6000 MCG SUBL Place 1 tablet under the tongue daily.      escitalopram (LEXAPRO) 5 MG tablet Take 1 tablet (5 mg total) by mouth every other day. 45 tablet 3   levothyroxine (SYNTHROID) 100 MCG tablet TAKE 1 TABLET(100 MCG) BY MOUTH DAILY 90 tablet 3   Multiple Vitamins-Minerals (MULTIVITAMIN WITH MINERALS) tablet Take 1 tablet by mouth daily.     Omega-3 Fatty Acids (FISH OIL PO) Take by mouth.     omeprazole (PRILOSEC) 20 MG capsule Take 20 mg by mouth daily.     polyethylene glycol (MIRALAX / GLYCOLAX) packet Take 17 g by mouth daily.     PREVIDENT 5000 PLUS 1.1 % CREA dental cream SMARTSIG:Application By Mouth     vitamin E 100 UNIT capsule Take 100 Units by mouth daily.     simethicone (MYLICON) 80 MG chewable tablet Chew 80 mg by mouth every 6 (six) hours as needed for flatulence. (Patient not taking: Reported on 01/01/2022)     No current facility-administered medications on file prior to visit.    Allergies  Allergen Reactions   Codeine Nausea And Vomiting   Adhesive [Tape] Rash    Paper tape is okay   Latex Rash    Probably adhesive allergy.  bandaids bother patient    Past Medical History:  Diagnosis Date   Anxiety    Arthritis    finger!!   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 if patient gets up during meal to let  food pass, then n & v is not so bad   Midgut volvulus    SBO (small bowel obstruction) ([place]) 11/13/2016   Small bowel obstruction (HCC)    Small bowel obstruction due to postoperative adhesions 06/29/2017   Thyroid disease     Past Surgical History:  Procedure Laterality Date   APPENDECTOMY  2002   LAPAROTOMY N/A 09/28/2017   Procedure: EXPLORATORY LAPAROTOMY WITH LYSIS OF ADHESIONS;  Surgeon: Vickie Epley, MD;  Location: [facility] ORS;  Service: General;  Laterality: N/A;   PARTIAL COLECTOMY  2002    large polyp   TONSILLECTOMY     TUBAL LIGATION      Family History  Problem Relation Age of Onset   Leukemia Mother        CLL   Leukemia Father    Hyperlipidemia Brother    Stroke Brother    Heart disease Paternal Uncle    Diabetes Neg Hx     Social History   Socioeconomic History   Marital status: Married    Spouse name: Not on file   Number of children: 3   Years of education: Not on file   Highest education level: Not on file  Occupational History   Occupation: Network engineer   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    Comment: retired  Tobacco Use   Smoking status: Never    Passive exposure: Past   Smokeless tobacco: Never  Vaping Use   Vaping Use: Never used  Substance and Sexual Activity   Alcohol use: Yes    Comment: rare wine    Drug use: No   Sexual activity: Yes  Other Topics Concern   Not on file  Social History Narrative   2 sons, 1 daughter--- Richardson Landry in [place] in [place], Gardiner Rhyme in [place]      Has a living will   Daughter Bethena Roys, then sons, should make health care decisions   Would accept resuscitation attempts   Not sure about tube feeds---but not if cognitively unaware   Social Determinants of Health   Financial Resource Strain: Low Risk  (04/18/2021)   Overall Financial Resource Strain (CARDIA)    Difficulty of Paying Living Expenses: Not hard at all  Food Insecurity: No Food Insecurity (04/18/2021)   Hunger Vital Sign    Worried About Running Out of Food in the Last Year: Never true    Ran Out of Food in the Last Year: Never true  Transportation Needs: Not on file  Physical Activity: Not on file  Stress: Not on file  Social Connections: Not on file  Intimate Partner Violence: Not on file   Review of Systems Appetite is good Weight stable Sleeps well Wears seat belt Teeth are okay---new lower partial No suspicious skin lesions now--seeing derm at [place] okay---pads for mixed incontinence    Objective:   Physical  Exam Constitutional:      Appearance: Normal appearance.  HENT:     Right Ear: Tympanic membrane and ear canal normal.     Left Ear: Tympanic membrane and ear canal normal.     Mouth/Throat:     Comments: No lesions Eyes:     Conjunctiva/sclera: Conjunctivae normal.     Pupils: Pupils are equal, round, and reactive to light.  Cardiovascular:     Rate and Rhythm: Normal rate and regular rhythm.     Pulses: Normal pulses.     Heart sounds: No murmur heard.    No gallop.  Pulmonary:  Effort: Pulmonary effort is normal.     Breath sounds: Normal breath sounds. No wheezing or rales.  Abdominal:     Palpations: Abdomen is soft.     Tenderness: There is no abdominal tenderness.  Musculoskeletal:     Cervical back: Neck supple.     Right lower leg: No edema.     Left lower leg: No edema.  Lymphadenopathy:     Cervical: No cervical adenopathy.  Skin:    Findings: No lesion or rash.  Neurological:     General: No focal deficit present.     Mental Status: She is alert and oriented to person, place, and time.     Comments: Mini-cog--normal  Psychiatric:        Mood and Affect: Mood normal.        Behavior: Behavior normal.            Assessment & Plan:

## 2022-01-01 NOTE — Assessment & Plan Note (Addendum)
I have personally reviewed the Medicare Annual Wellness questionnaire and have noted 1. The patient's medical and social history 2. Their use of alcohol, tobacco or illicit drugs 3. Their current medications and supplements 4. The patient's functional ability including ADL's, fall risks, home safety risks and hearing or visual             impairment. 5. Diet and physical activities 6. Evidence for depression or mood disorders  The patients weight, height, BMI and visual acuity have been recorded in the chart I have made referrals, counseling and provided education to the patient based review of the above and I have provided the pt with a written personalized care plan for preventive services.  I have provided you with a copy of your personalized plan for preventive services. Please take the time to review along with your updated medication list.  Discussed more leg strengthening in the gym No cancer screening due to age Flu vaccine today Needs COVID and 2nd shingrix vaccines at the pharmacy

## 2022-01-01 NOTE — Assessment & Plan Note (Signed)
May be related to the RUQ pain she gets--could affect the intestinal motility with roughage, etc No major pain issues

## 2022-01-01 NOTE — Assessment & Plan Note (Signed)
On imaging No statin due to age 

## 2022-01-30 ENCOUNTER — Other Ambulatory Visit: Payer: Self-pay | Admitting: Internal Medicine

## 2022-02-12 ENCOUNTER — Ambulatory Visit: Payer: Medicare Other | Admitting: Pharmacist

## 2022-02-12 DIAGNOSIS — I700 Atherosclerosis of aorta: Secondary | ICD-10-CM

## 2022-02-12 DIAGNOSIS — K2100 Gastro-esophageal reflux disease with esophagitis, without bleeding: Secondary | ICD-10-CM

## 2022-02-12 DIAGNOSIS — F39 Unspecified mood [affective] disorder: Secondary | ICD-10-CM

## 2022-02-12 DIAGNOSIS — E039 Hypothyroidism, unspecified: Secondary | ICD-10-CM

## 2022-02-12 DIAGNOSIS — E785 Hyperlipidemia, unspecified: Secondary | ICD-10-CM

## 2022-02-12 NOTE — Progress Notes (Signed)
Chronic Care Management Pharmacy Note  02/12/2022 Name:  Diana Owen MRN:  [id number] DOB:  1935-01-07  Summary: CCM F/U visit -Reviewed medications; pt affirms adherence as prescribed, denies issues -Pt reports shoulder pain, relieved with heat and Tylenol, not limiting right now  Recommendations/Changes made from today's visit: -No med changes -Advised to call office if shoulder pain worsens  Plan: -[place] will call patient 6 months for general adherence review -Pharmacist follow up PRN -PCP annual 01/05/23    Subjective: Diana Owen is an 87 y.o. year old female who is a primary patient of Venia Carbon, MD.  The CCM team was consulted for assistance with disease management and care coordination needs.    Engaged with patient by telephone for follow up visit in response to provider referral for pharmacy case management and/or care coordination services.   Consent to Services:  The patient was given information about Chronic Care Management services, agreed to services, and gave verbal consent prior to initiation of services.  Please see initial visit note for detailed documentation.   Patient Care Team: Venia Carbon, MD as PCP - General (Internal Medicine) Charlton Haws, [facility] as Pharmacist (Pharmacist)  Recent office visits: 01/01/22 Dr Silvio Pate OV: annual - no changes.  11/05/21 Dr Einar Pheasant OV: sinusitis - Rx Augmentin.  10/14/21 Dr Copland OV: RUQ pain. Abd US WNL. Rec CT.  07/22/21 NP Eugenia Pancoast VV: acute viral sinusitis - rec Claritin, Mucinex. Test for COVID positive, start [place].   05/30/21 Dr Silvio Pate OV: f/u depression. Reduce escitalopram to QOD (pt preference). No med changes.  Recent consult visits: 09/30/21 Urgent Care - UTI - Ecoli.  Rx'd Keflex.  Hospital visits: None   Objective:  Lab Results  Component Value Date   CREATININE 0.59 10/14/2021   BUN 15 10/14/2021   GFR 82.16 10/14/2021   GFRNONAA >60 09/30/2017    GFRAA >60 09/30/2017   NA 138 10/14/2021   K 4.0 10/14/2021   CALCIUM 9.2 10/14/2021   CO2 28 10/14/2021   GLUCOSE 97 10/14/2021    Lab Results  Component Value Date/Time   GFR 82.16 10/14/2021 11:23 AM   GFR 81.01 01/01/2021 11:47 AM    Last diabetic Eye exam: No results found for: "HMDIABEYEEXA"  Last diabetic Foot exam: No results found for: "HMDIABFOOTEX"   Lab Results  Component Value Date   CHOL 232 (H) 01/01/2021   HDL 64.80 01/01/2021   LDLCALC 147 (H) 01/01/2021   TRIG 99.0 01/01/2021   CHOLHDL 4 01/01/2021       Latest Ref Rng & Units 10/14/2021   11:23 AM 01/01/2021   11:47 AM 12/16/2019   11:32 AM  Hepatic Function  Total Protein 6.0 - 8.3 g/dL 6.7  6.6  6.9   Albumin 3.5 - 5.2 g/dL 4.1  4.1  4.3   AST 0 - 37 U/L _0 ALT 0 - 35 U/L _1 Alk Phosphatase 39 - 117 U/L 72  68  65   Total Bilirubin 0.2 - 1.2 mg/dL 0.8  0.7  0.9   Bilirubin, Direct 0.0 - 0.3 mg/dL 0.1       Lab Results  Component Value Date/Time   TSH 0.78 01/01/2021 11:47 AM   TSH 0.43 12/16/2019 11:32 AM   FREET4 1.04 01/01/2021 11:47 AM   FREET4 1.07 12/16/2019 11:32 AM       Latest Ref Rng & Units 10/14/2021   11:23  AM 01/01/2021   11:47 AM 12/16/2019   11:32 AM  CBC  WBC 4.0 - 10.5 K/uL 8.2  6.6  8.3   Hemoglobin 12.0 - 15.0 g/dL 14.0  13.7  14.1   Hematocrit 36.0 - 46.0 % 40.8  41.0  41.8   Platelets 150.0 - 400.0 K/uL 214.0  198.0  208.0     No results found for: "VD25OH"  Clinical ASCVD: Yes  - aortic atherosclerosis The ASCVD Risk score (Arnett DK, et al., 2019) failed to calculate for the following reasons:   The 2019 ASCVD risk score is only valid for ages 75 to 12       01/01/2022   11:19 AM 01/01/2021   11:32 AM 01/01/2021   10:58 AM  Depression screen PHQ 2/9  Decreased Interest 0 0 0  Down, Depressed, Hopeless 1 1 0  PHQ - 2 Score 1 1 0     Social History   Tobacco Use  Smoking Status Never   Passive exposure: Past  Smokeless Tobacco  Never   BP Readings from Last 3 Encounters:  01/01/22 118/78  11/05/21 100/60  10/14/21 100/70   Pulse Readings from Last 3 Encounters:  01/01/22 84  11/05/21 71  10/14/21 76   Wt Readings from Last 3 Encounters:  01/01/22 124 lb (56.2 kg)  11/05/21 122 lb (55.3 kg)  10/14/21 120 lb 6 oz (54.6 kg)   BMI Readings from Last 3 Encounters:  01/01/22 23.43 kg/m  11/05/21 23.05 kg/m  10/14/21 22.74 kg/m    Assessment/Interventions: Review of patient past medical history, allergies, medications, health status, including review of consultants reports, laboratory and other test data, was performed as part of comprehensive evaluation and provision of chronic care management services.   SDOH:  (Social Determinants of Health) assessments and interventions performed: No - done Feb 2023 SDOH Interventions    Flowsheet Row Chronic Care Management from 04/18/2021 in [place] at [place] Interventions Intervention Not Indicated  Financial Strain Interventions Intervention Not Indicated       SDOH Screenings   Food Insecurity: No Food Insecurity (04/18/2021)  Depression (PHQ2-9): Low Risk  (01/01/2022)  Financial Resource Strain: Low Risk  (04/18/2021)  Tobacco Use: Low Risk  (01/01/2022)    CCM Care Plan  Allergies  Allergen Reactions   Codeine Nausea And Vomiting   Adhesive [Tape] Rash    Paper tape is okay   Latex Rash    Probably adhesive allergy.  bandaids bother patient    Medications Reviewed Today     Reviewed by Charlton Haws, [facility] (Pharmacist) on 02/12/22 at 0858  Med List Status: <None>   Medication Order Taking? Sig Documenting Provider Last Dose Status Informant  acetaminophen (TYLENOL) 500 MG tablet [id number] Yes Take 500 mg by mouth every 6 (six) hours as needed. [provider] Taking Active   ALPRAZolam (XANAX) 0.25 MG tablet [id number] Yes TAKE 1/2 TO 1 TABLET(0.125 TO 0.25 MG) BY MOUTH THREE  TIMES DAILY AS NEEDED FOR ANXIETY Venia Carbon, MD Taking Active   Ascorbic Acid (VITAMIN C) 100 MG tablet [id number] Yes Take 100 mg by mouth daily. [provider] Taking Active   BIOTIN PO [id number] Yes Take by mouth. [provider] Taking Active   cholecalciferol (VITAMIN D) 1000 units tablet [id number] Yes Take 1,000 Units by mouth daily. [provider] Taking Active Self  Cyanocobalamin (VITAMIN B-12) 6000 MCG SUBL [id number] Yes Place 1 tablet under the  tongue daily. [provider] Taking Active Self  escitalopram (LEXAPRO) 5 MG tablet [id number] Yes Take 1 tablet (5 mg total) by mouth every other day. Venia Carbon, MD Taking Active   levothyroxine (SYNTHROID) 100 MCG tablet [id number] Yes TAKE 1 TABLET(100 MCG) BY MOUTH DAILY Venia Carbon, MD Taking Active   Multiple Vitamins-Minerals (MULTIVITAMIN WITH MINERALS) tablet [id number] Yes Take 1 tablet by mouth daily. [provider] Taking Active Self  Omega-3 Fatty Acids (FISH OIL PO) [id number] Yes Take by mouth. [provider] Taking Active   omeprazole (PRILOSEC) 20 MG capsule [id number] Yes TAKE 1 CAPSULE(20 MG) BY MOUTH EVERY OTHER DAY Venia Carbon, MD Taking Active   polyethylene glycol [facility] / GLYCOLAX) packet [id number] Yes Take 17 g by mouth daily. [provider] Taking Active Self  PREVIDENT 5000 PLUS 1.1 % CREA dental cream [id number] Yes SMARTSIG:Application By Mouth [provider] Taking Active   simethicone (MYLICON) 80 MG chewable tablet [id number] Yes Chew 80 mg by mouth every 6 (six) hours as needed for flatulence. [provider] Taking Active   vitamin E 100 UNIT capsule [id number] Yes Take 100 Units by mouth daily. [provider] Taking Active Self            Patient Active Problem List   Diagnosis Date Noted   Right shoulder pain 05/30/2021   Mixed stress and urge urinary incontinence 06/26/2020   Scoliosis  06/26/2020   Aortic atherosclerosis ([place]) 12/16/2019   Mood disorder ([place]) 12/09/2018   Left hip pain 10/25/2018   Preventative health care 11/26/2017   Advance directive discussed with patient 11/26/2017   Other idiopathic scoliosis, thoracolumbar region 07/16/2016   Esophageal dysphagia 09/26/2015   Acid reflux 09/04/2015   Hyperlipidemia 09/04/2015   Hypothyroidism 09/04/2015    Immunization History  Administered Date(s) Administered   Fluad Quad(high Dose 65+) 01/01/2022   Influenza Split 12/27/2015   Influenza,inj,Quad PF,6+ Mos 11/21/2016, 11/26/2017   Influenza-Unspecified 11/16/2013, 12/25/2014   Moderna Covid-19 Vaccine Bivalent Booster 43yr & up 12/06/2020   Moderna Sars-Covid-2 Vaccination 03/29/2019, 04/26/2019, 08/02/2020   Pneumococcal Conjugate-13 12/25/2014   Pneumococcal Polysaccharide-23 11/09/2009, 11/26/2017   Tdap 11/13/2006   Zoster, Live 09/28/2009    Conditions to be addressed/monitored:  Hyperlipidemia, GERD, Hypothyroidism, Anxiety, and Osteopenia  Care Plan : [place] Updates made by FCharlton Haws RTulsasince 02/12/2022 12:00 AM     Problem: Hyperlipidemia, GERD, Hypothyroidism, Anxiety, and Osteopenia   Priority: High     Long-Range Goal: Disease mgmt   Start Date: 04/18/2021  Expected End Date: 02/12/2022  This Visit's Progress: On track  Recent Progress: On track  Priority: High  Note:   Current Barriers:  None identified  Pharmacist Clinical Goal(s):  Patient will contact provider office for questions/concerns as evidenced notation of same in electronic health record through collaboration with PharmD and provider.   Interventions: 1:1 collaboration with LVenia Carbon MD regarding development and update of comprehensive plan of care as evidenced by provider attestation and co-signature Inter-disciplinary care team collaboration (see longitudinal plan of care) Comprehensive medication review performed;  medication list updated in electronic medical record  Hyperlipidemia: (LDL goal < 130) -Not ideally controlled - LDL 147 (12/2020) elevated w/ hx of aortic atherosclerosis on X-ray. Holding off on statin per PCP. -Current treatment: OTC Omega-3 fish oil - Appropriate, Effective, Safe, Accessible -Medications previously tried: none  -Educated on Cholesterol goals; Importance of limiting foods high in cholesterol; -Counseled on diet and  exercise extensively  Anxiety (Goal: manage symptoms) -Controlled - pt reports improvement in stress-related GI distress and hot flashes since starting escitalopram; she reports using alprazolam infrequently now -Current treatment: Alprazolam 0.25 mg 1/2-1 tab TID prn (rare use) - Appropriate, Effective, Safe, Accessible Escitalopram 5 mg QOD - Appropriate, Effective, Safe, Accessible -Medications previously tried/failed: none -Reviewed benefits of escitalopram for anxiety; needing alprazolam less often is a clear sign that SSRI has been effective -Recommend to continue current medication  Hypothyroidism (Goal: maintain TSH in goal range) -Controlled - pt is taking levothyroxine with breakfast; TSH is low-normal -Current treatment  Levothyroxine 100 mcg daily - Appropriate, Effective, Safe, Accessible -Discussed levothyroxine is absorbed better on an empty stomach, however in her case this is not necessarily ideal as her most recent TSH was borderline hyperthyroid; hot flashes may be stemming from increasing thyroid activity due to improved absorption of levothyroxine on empty stomach -Recommend to continue current medication  GERD (Goal: manage symptoms) -Improved -pt reports food "piles up" when she is eating; she reports symptoms are improved with PPI and SSRI, she still has to take breaks while eating but overall this has improved -Pt previously stopped PPI d/t previous doctor warning of memory loss -Current treatment  Omeprazole 20 mg daily -  Appropriate, Effective, Query Safe, Accessible Gas relief PRN - Appropriate, Effective, Safe, Accessible -Reviewed chronic PPI use is associated with decreased bone density; some studies have linked it to dementia as well, although consensus is unclear; in her case benefits > risks -Recommended to continue current medication   Shoulder pain  -Pt reports R shoulder bothers her on occasion -Pt gets relief with heat and tylenol PRN; she is not taking Tylenol on a daily basis -Current treatment  Tylenol 500 mg PRN - Appropriate, Effective, Safe, Accessible -Medications previously tried: n/a  -Advised to call office if pain worsens  Health Maintenance -Vaccine gaps: Shingrix -Pt reports she got 2 Shingrix shots at [facility], 2nd one was about a week ago   Patient Goals/Self-Care Activities Patient will:  - take medications as prescribed as evidenced by patient report and record review -focus on medication adherence by pill box     Medication Assistance: None required.  Patient affirms current coverage meets needs.  Compliance/Adherence/Medication fill history: Care Gaps: DEXA - performed 03/26/2016; indicative of osteopenia in hip; pt declines further scans  Star-Rating Drugs: None  Medication Access: Within the past 30 days, how often has patient missed a dose of medication? 0 Is a pillbox or other method used to improve adherence? Yes  Factors that may affect medication adherence? no barriers identified Are meds synced by current pharmacy? No  Are meds delivered by current pharmacy? No  Does patient experience delays in picking up medications due to transportation concerns? No   Upstream Services Reviewed: Is patient disadvantaged to use [facility]?: Yes  Current Rx insurance plan: [employer] Name and location of Current pharmacy:  [facility] [place], [place] - [address] [place]  [place] [address] Phone: [phone] Fax: [phone]  [facility] Pharm[facility] services reviewed with patient today?: No  Patient requests to transfer care to Upstream Pharmacy?: No  Reason patient declined to change pharmacies: Disadvantaged due to insurance/mail order   Care Plan and Follow Up Patient Decision:  Patient agrees to Care Plan and Follow-up.  Plan: The patient has been provided with contact information for the care management team and has  been advised to call with any health related questions or concerns.   Charlene Brooke, PharmD, BCACP Clinical Pharmacist [facility] at [facility] [phone]

## 2022-02-12 NOTE — Patient Instructions (Signed)
Visit Information  Phone number for Pharmacist: [phone]   Goals Addressed   None     Care Plan : [place]  Updates made by Charlton Haws, [place] since 02/12/2022 12:00 AM     Problem: Hyperlipidemia, GERD, Hypothyroidism, Anxiety, and Osteopenia   Priority: High     Long-Range Goal: Disease mgmt   Start Date: 04/18/2021  Expected End Date: 02/12/2022  This Visit's Progress: On track  Recent Progress: On track  Priority: High  Note:   Current Barriers:  None identified  Pharmacist Clinical Goal(s):  Patient will contact provider office for questions/concerns as evidenced notation of same in electronic health record through collaboration with PharmD and provider.   Interventions: 1:1 collaboration with Venia Carbon, MD regarding development and update of comprehensive plan of care as evidenced by provider attestation and co-signature Inter-disciplinary care team collaboration (see longitudinal plan of care) Comprehensive medication review performed; medication list updated in electronic medical record  Hyperlipidemia: (LDL goal < 130) -Not ideally controlled - LDL 147 (12/2020) elevated w/ hx of aortic atherosclerosis on X-ray. Holding off on statin per PCP. -Current treatment: OTC Omega-3 fish oil - Appropriate, Effective, Safe, Accessible -Medications previously tried: none  -Educated on Cholesterol goals; Importance of limiting foods high in cholesterol; -Counseled on diet and exercise extensively  Anxiety (Goal: manage symptoms) -Controlled - pt reports improvement in stress-related GI distress and hot flashes since starting escitalopram; she reports using alprazolam infrequently now -Current treatment: Alprazolam 0.25 mg 1/2-1 tab TID prn (rare use) - Appropriate, Effective, Safe, Accessible Escitalopram 5 mg QOD - Appropriate, Effective, Safe, Accessible -Medications previously tried/failed: none -Reviewed benefits of escitalopram for  anxiety; needing alprazolam less often is a clear sign that SSRI has been effective -Recommend to continue current medication  Hypothyroidism (Goal: maintain TSH in goal range) -Controlled - pt is taking levothyroxine with breakfast; TSH is low-normal -Current treatment  Levothyroxine 100 mcg daily - Appropriate, Effective, Safe, Accessible -Discussed levothyroxine is absorbed better on an empty stomach, however in her case this is not necessarily ideal as her most recent TSH was borderline hyperthyroid; hot flashes may be stemming from increasing thyroid activity due to improved absorption of levothyroxine on empty stomach -Recommend to continue current medication  GERD (Goal: manage symptoms) -Improved -pt reports food "piles up" when she is eating; she reports symptoms are improved with PPI and SSRI, she still has to take breaks while eating but overall this has improved -Pt previously stopped PPI d/t previous doctor warning of memory loss -Current treatment  Omeprazole 20 mg daily - Appropriate, Effective, Query Safe, Accessible Gas relief PRN - Appropriate, Effective, Safe, Accessible -Reviewed chronic PPI use is associated with decreased bone density; some studies have linked it to dementia as well, although consensus is unclear; in her case benefits > risks -Recommended to continue current medication   Shoulder pain  -Pt reports R shoulder bothers her on occasion -Pt gets relief with heat and tylenol PRN; she is not taking Tylenol on a daily basis -Current treatment  Tylenol 500 mg PRN - Appropriate, Effective, Safe, Accessible -Medications previously tried: n/a  -Advised to call office if pain worsens  Health Maintenance -Vaccine gaps: Shingrix -Pt reports she got 2 Shingrix shots at [facility], 2nd one was about a week ago   Patient Goals/Self-Care Activities Patient will:  - take medications as prescribed as evidenced by patient report and record review -focus on  medication adherence by pill box  The patient verbalized understanding of instructions, educational materials, and care plan provided today and DECLINED offer to receive copy of patient instructions, educational materials, and care plan.  Telephone follow up appointment with pharmacy team member scheduled for: PRN  Al Corpus, PharmD, BCACP Clinical Pharmacist [facility] at [facility] [phone]

## 2022-02-19 ENCOUNTER — Other Ambulatory Visit: Payer: Self-pay | Admitting: Internal Medicine

## 2022-02-19 NOTE — Telephone Encounter (Signed)
Last filled 07-09-21 #30 Last OV 01-01-22 Next OV 01-05-23 [facility] [address] [employer]

## 2022-02-22 ENCOUNTER — Other Ambulatory Visit: Payer: Self-pay | Admitting: Internal Medicine

## 2022-04-16 ENCOUNTER — Other Ambulatory Visit: Payer: Self-pay | Admitting: Internal Medicine

## 2022-06-24 DIAGNOSIS — Z23 Encounter for immunization: Secondary | ICD-10-CM | POA: Diagnosis not present

## 2022-07-18 ENCOUNTER — Telehealth: Payer: Self-pay

## 2022-07-18 NOTE — Progress Notes (Signed)
Care Management & Coordination Services Pharmacy Team  Reason for Encounter: General adherence update   Contacted patient for general health update and medication adherence call.  Spoke with patient on 07/23/2022    What concerns do you have about your medications? No concerns at this time.  The patient denies side effects with their medications.   How often do you forget or accidentally miss a dose? Never  Do you use a pillbox? Yes  Are you having any problems getting your medications from your pharmacy? No  Has the cost of your medications been a concern? No- [facility] issues with [facility]  Since last visit with PharmD, the following interventions have been made.  Shoulder pain under control for the patient with heat in am. Patient has been able to cut back on taking omeprazole and is using as needed   The patient has not had an ED visit since last contact.   The patient reports the following and denies problems with their health.   Patient denies concerns or questions for Al Corpus , PharmD at this time.   Counseled patient on: [place] job taking medications, Importance of taking medication daily without missed doses, Benefits of adherence packaging or a pillbox, and Access to carecoordination team for any cost, medication or pharmacy concerns.   Chart Updates:  Recent office visits:  None since last contact  Recent consult visits:  None since last contact  Hospital visits:  None in 6 months   Medications: Outpatient Encounter Medications as of 07/18/2022  Medication Sig   acetaminophen (TYLENOL) 500 MG tablet Take 500 mg by mouth every 6 (six) hours as needed.   ALPRAZolam (XANAX) 0.25 MG tablet TAKE 1/2 TO 1 TABLET(0.125 TO 0.25 MG) BY MOUTH THREE TIMES DAILY AS NEEDED FOR ANXIETY   Ascorbic Acid (VITAMIN C) 100 MG tablet Take 100 mg by mouth daily.   BIOTIN PO Take by mouth.   cholecalciferol (VITAMIN D) 1000 units tablet Take 1,000 Units by mouth daily.    Cyanocobalamin (VITAMIN B-12) 6000 MCG SUBL Place 1 tablet under the tongue daily.   escitalopram (LEXAPRO) 5 MG tablet TAKE 1 TABLET(5 MG) BY MOUTH EVERY OTHER DAY   levothyroxine (SYNTHROID) 100 MCG tablet TAKE 1 TABLET(100 MCG) BY MOUTH DAILY   Multiple Vitamins-Minerals (MULTIVITAMIN WITH MINERALS) tablet Take 1 tablet by mouth daily.   Omega-3 Fatty Acids (FISH OIL PO) Take by mouth.   omeprazole (PRILOSEC) 20 MG capsule TAKE 1 CAPSULE(20 MG) BY MOUTH EVERY OTHER DAY   polyethylene glycol (MIRALAX / GLYCOLAX) packet Take 17 g by mouth daily.   PREVIDENT 5000 PLUS 1.1 % CREA dental cream SMARTSIG:Application By Mouth   simethicone (MYLICON) 80 MG chewable tablet Chew 80 mg by mouth every 6 (six) hours as needed for flatulence.   vitamin E 100 UNIT capsule Take 100 Units by mouth daily.   No facility-administered encounter medications on file as of 07/18/2022.    Recent vitals BP Readings from Last 3 Encounters:  01/01/22 118/78  11/05/21 100/60  10/14/21 100/70   Pulse Readings from Last 3 Encounters:  01/01/22 84  11/05/21 71  10/14/21 76   Wt Readings from Last 3 Encounters:  01/01/22 124 lb (56.2 kg)  11/05/21 122 lb (55.3 kg)  10/14/21 120 lb 6 oz (54.6 kg)   BMI Readings from Last 3 Encounters:  01/01/22 23.43 kg/m  11/05/21 23.05 kg/m  10/14/21 22.74 kg/m    Recent lab results    Component Value Date/Time   NA  138 10/14/2021 1123   K 4.0 10/14/2021 1123   CL 102 10/14/2021 1123   CO2 28 10/14/2021 1123   GLUCOSE 97 10/14/2021 1123   BUN 15 10/14/2021 1123   CREATININE 0.59 10/14/2021 1123   CALCIUM 9.2 10/14/2021 1123    Lab Results  Component Value Date   CREATININE 0.59 10/14/2021   GFR 82.16 10/14/2021   GFRNONAA >60 09/30/2017   GFRAA >60 09/30/2017   No results found for: "HGBA1C", "FRUCTOSAMINE", "MICROALBUR"  Lab Results  Component Value Date   CHOL 232 (H) 01/01/2021   HDL 64.80 01/01/2021   LDLCALC 147 (H) 01/01/2021   TRIG 99.0  01/01/2021   CHOLHDL 4 01/01/2021    Care Gaps: Annual wellness visit in last year? Yes   Star Rating Drugs:  Medication:  Last Fill: Day Supply No star medication identified  Al Corpus, PharmD notified  Burt Knack, [facility] Clinical Pharmacy Assistant [phone]

## 2022-08-04 DIAGNOSIS — B351 Tinea unguium: Secondary | ICD-10-CM | POA: Diagnosis not present

## 2022-08-04 DIAGNOSIS — I7091 Generalized atherosclerosis: Secondary | ICD-10-CM | POA: Diagnosis not present

## 2022-08-14 ENCOUNTER — Telehealth: Payer: Self-pay | Admitting: Internal Medicine

## 2022-08-14 MED ORDER — ESCITALOPRAM OXALATE 5 MG PO TABS: 5.0000 mg | ORAL_TABLET | Freq: Every day | ORAL | 3 refills | Status: DC

## 2022-08-14 NOTE — Telephone Encounter (Signed)
Spoke to pt. She does want to keep taking it daily as it does help. I sent in a new rx for once daily.

## 2022-08-14 NOTE — Telephone Encounter (Signed)
Patient contacted the office requesting a refill on medication escitalopram 5mg , says she was told by pharmacy it was too soon t fill, which is true because patient said she has been taking this medication every day instead of every other day. Patient wants to know if she should take it like this or if she should go back to every other day? Patient will be needing a refill for this medication  and [facility]. Please advise, thank you

## 2022-09-02 ENCOUNTER — Other Ambulatory Visit: Payer: Self-pay | Admitting: Internal Medicine

## 2022-09-02 NOTE — Telephone Encounter (Signed)
Last filled 02-19-22 #30 Last OV 01-01-22 Next OV 01-05-23 [facility] [address] [employer] [employer]

## 2022-09-08 NOTE — Telephone Encounter (Signed)
Received a fax form her insurance stating they will not cover alprazolam. But, they will cover clonazepam, diazepam, and lorazepam.

## 2022-09-08 NOTE — Telephone Encounter (Signed)
Spoke to pt. She said she does not know if she has been on any of the other meds before. She has a GoodRx card. She will use that and pay cash for it.

## 2022-09-10 ENCOUNTER — Telehealth: Payer: Self-pay | Admitting: Internal Medicine

## 2022-09-10 NOTE — Telephone Encounter (Signed)
Patient contacted the office requesting a call back regarding medication questions. Please advise, thank you.

## 2022-09-10 NOTE — Telephone Encounter (Signed)
Spoke to pt. SShe said when she went to the pharmacy they did not have her alprazolam. I placed her on hold and called the pharmacy. Spoke to [place]. He said they have the rx but was waiting to see if a PA was going to be done. I advised it was not and that she was going to use a Good Rx card for it. He is getting it ready for her. Advised pt.

## 2022-10-31 ENCOUNTER — Other Ambulatory Visit: Payer: Self-pay | Admitting: Internal Medicine

## 2022-12-06 ENCOUNTER — Other Ambulatory Visit: Payer: Self-pay | Admitting: Internal Medicine

## 2022-12-08 NOTE — Telephone Encounter (Signed)
Last filled 09-10-22 #30 Last OV 01-01-22 Next OV 01-05-23 [facility] [address] [employer]

## 2022-12-23 ENCOUNTER — Telehealth: Payer: Self-pay | Admitting: Internal Medicine

## 2022-12-23 NOTE — Telephone Encounter (Signed)
Patient requested a call back from [facility] when possible. Can be reached at mobile number

## 2022-12-23 NOTE — Telephone Encounter (Signed)
That is nice to hear I was going to recommend she transfer over soon anyway

## 2022-12-23 NOTE — Telephone Encounter (Signed)
Pt called to let us know she is transferring to Dr Sydnee Cabal. Wanted to thank Dr Alphonsus Sias and myself for all the wonderful care she has received. Said Dr Alphonsus Sias was the best Dr she has had.

## 2022-12-24 DIAGNOSIS — Z23 Encounter for immunization: Secondary | ICD-10-CM | POA: Diagnosis not present

## 2023-01-05 ENCOUNTER — Encounter: Payer: Medicare Other | Admitting: Internal Medicine

## 2023-01-13 ENCOUNTER — Encounter: Payer: Self-pay | Admitting: *Deleted

## 2023-01-16 ENCOUNTER — Encounter: Payer: Self-pay | Admitting: Student

## 2023-01-16 ENCOUNTER — Ambulatory Visit: Payer: Medicare Other | Admitting: Student

## 2023-01-16 VITALS — BP 132/78 | HR 75 | Temp 97.6°F | Ht 61.0 in | Wt 125.0 lb

## 2023-01-16 DIAGNOSIS — K2100 Gastro-esophageal reflux disease with esophagitis, without bleeding: Secondary | ICD-10-CM

## 2023-01-16 DIAGNOSIS — I700 Atherosclerosis of aorta: Secondary | ICD-10-CM | POA: Diagnosis not present

## 2023-01-16 DIAGNOSIS — F0390 Unspecified dementia without behavioral disturbance: Secondary | ICD-10-CM

## 2023-01-16 DIAGNOSIS — R1310 Dysphagia, unspecified: Secondary | ICD-10-CM | POA: Diagnosis not present

## 2023-01-16 DIAGNOSIS — F39 Unspecified mood [affective] disorder: Secondary | ICD-10-CM

## 2023-01-16 DIAGNOSIS — N3946 Mixed incontinence: Secondary | ICD-10-CM

## 2023-01-16 DIAGNOSIS — E039 Hypothyroidism, unspecified: Secondary | ICD-10-CM

## 2023-01-16 DIAGNOSIS — M419 Scoliosis, unspecified: Secondary | ICD-10-CM | POA: Diagnosis not present

## 2023-01-16 MED ORDER — ESCITALOPRAM OXALATE 5 MG PO TABS: 10.0000 mg | ORAL_TABLET | Freq: Every day | ORAL | 3 refills | Status: DC

## 2023-01-16 NOTE — Progress Notes (Signed)
Location:  [facility]   Provider: Dr. Earnestine Mealing  Code Status: DNR Goals of Care:     01/16/2023    1:27 PM  Advanced Directives  Does Patient Have a Medical Advance Directive? Yes  Type of Estate agent of [place];Out of facility DNR (pink MOST or yellow form);Living will  Does patient want to make changes to medical advance directive? No - Patient declined  Copy of Healthcare Power of Attorney in Chart? No - copy requested     Chief Complaint  Patient presents with   Establish Care    Establish Care.    Quality Metric Gaps    To discuss need for Tdap, Dexascan.     HPI: Patient is a 88 y.o. female seen today for medical management of chronic diseases.    Chronic Conditions: Sinusitis- 1 year ago had covid. Has drainage. Has had varying colors of sputum. Now it's clear and colorless. She had OTC medication and steroid to help with her symptoms. It was an antihistamine  and didn't take the steroid. Twice she tried peroxide and water into the nose.  Acid Reflux - food processes slow  Incontinent of urine - this has been for the last 2-3 years. She has more stress -like symptoms with coughing and does not get up at night over and over to urinate.  Anxiety/Depression - has lexapro. Having trouble with  Vertigo - she has meclizine. She has periodic symptoms of feeling like she was going to have vertigo. She doesn't drink much water. 2 cups of liquis at water. Very little   Medications - She puts all of her medications in a pill pack. Takes her medication for reflux first and if she doesn't the food doesn't go down as well. She takes xanax 1/2 tablet prn. She started this 3-4 years ago. Only takes 2-3x per week. If she changes what she is doing and goes walking, she feels much better. Discussed concern for use of Xanax   Matters Most: Mobility, doing yard work and ambulation.   Mobility - no falls in the last year. More trouble going up  stairs. She has scoliosis.   Mentation - she has a feeling of hopelesness often. SLUMS 19/30. She gets help with cleaning her home. Gets help with computer and phone. She gets taken to the grocery store. She gets help with changing the bed. Help with the flowers if they are too heavy.   Social: Her husband Fredia Sorrow is on hospice in [place]. He doesn't really open his eyes. Takes 1.5-2 hours to eat a meal. He doesn't really make expressions and is less responsive. She eats pretty well. Her weight is stable. She has lived at [facility] for 6 years. She has three children. She is originally from [place], but they were in [place] for 32 years and [place] for [place] years (near [place]). She is retired from being a [employer] operational officer. She went straight from high school to a business school for a couple of years and left work. She drives doesn't go over 2 miles outside of [place]. She does get a driver when she needs more help.   Alcohol - drinks 1/2 a cup nightly. And drinks 1/4 before nd 1/4 after. She drinks hot chocolate before going to bed. She dilutes her alcohol with 1/2 water.    Past Medical History:  Diagnosis Date   Anxiety    Arthritis    finger!!   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 if patient gets up during meal to let food pass, then n & v is not so bad   Midgut volvulus    SBO (small bowel obstruction) (HCC) 11/13/2016   Small bowel obstruction (HCC)    Small bowel obstruction due to postoperative adhesions 06/29/2017   Thyroid disease     Past Surgical History:  Procedure Laterality Date   APPENDECTOMY  2002   LAPAROTOMY N/A 09/28/2017   Procedure: EXPLORATORY LAPAROTOMY WITH LYSIS OF ADHESIONS;  Surgeon: Ancil Linsey, MD;  Location: [facility] ORS;  Service: General;   Laterality: N/A;   PARTIAL COLECTOMY  2002   large polyp   TONSILLECTOMY     TUBAL LIGATION      Allergies  Allergen Reactions   Codeine Nausea And Vomiting   Adhesive [Tape] Rash    Paper tape is okay   Latex Rash    Probably adhesive allergy.  bandaids bother patient    Outpatient Encounter Medications as of 01/16/2023  Medication Sig   acetaminophen (TYLENOL) 500 MG tablet Take 500 mg by mouth every 6 (six) hours as needed.   ALPRAZolam (XANAX) 0.25 MG tablet TAKE 1/2 TO 1 TABLET(0.125 TO 0.25 MG) BY MOUTH THREE TIMES DAILY AS NEEDED FOR ANXIETY   BIOTIN PO Take by mouth.   cholecalciferol (VITAMIN D) 1000 units tablet Take 1,000 Units by mouth daily.   Cyanocobalamin (VITAMIN B-12) 6000 MCG SUBL Place 1 tablet under the tongue daily.   escitalopram (LEXAPRO) 5 MG tablet Take 1 tablet (5 mg total) by mouth daily.   levothyroxine (SYNTHROID) 100 MCG tablet TAKE 1 TABLET(100 MCG) BY MOUTH DAILY   meclizine (ANTIVERT) 25 MG tablet Take 25 mg by mouth 3 (three) times daily as needed for dizziness.   Multiple Vitamins-Minerals (MULTIVITAMIN WITH MINERALS) tablet Take 1 tablet by mouth daily.   omeprazole (PRILOSEC) 20 MG capsule Take 20 mg by mouth daily.   polyethylene glycol (MIRALAX / GLYCOLAX) packet Take 17 g by mouth daily.   PREVIDENT 5000 PLUS 1.1 % CREA dental cream SMARTSIG:Application By Mouth   simethicone (MYLICON) 80 MG chewable tablet Chew 80 mg by mouth every 6 (six) hours as needed for flatulence.   vitamin E 100 UNIT capsule Take 100 Units by mouth daily.   [DISCONTINUED] Ascorbic Acid (VITAMIN C) 100 MG tablet Take 100 mg by mouth daily.   [DISCONTINUED] Omega-3 Fatty Acids (FISH OIL PO) Take by mouth.   [DISCONTINUED] omeprazole (PRILOSEC) 20 MG capsule TAKE 1 CAPSULE(20 MG) BY MOUTH EVERY OTHER DAY (Patient taking differently: Take 20 mg by mouth daily.)   No facility-administered encounter medications on file as of 01/16/2023.    Review of Systems:  Review of  Systems  Health Maintenance  Topic Date Due   DEXA SCAN  Never done   DTaP/Tdap/Td (2 - Td or Tdap) 11/12/2016   Medicare Annual Wellness (AWV)  01/02/2023   COVID-19 Vaccine (8 - 2023-24 season) 02/18/2023   Pneumonia Vaccine 22+ Years old  Completed   INFLUENZA VACCINE  Completed   Zoster Vaccines- Shingrix  Completed   HPV VACCINES  Aged Out    Physical Exam: Vitals:   01/16/23 1318  BP: 132/78  Pulse: 75  Temp: 97.6 F (36.4 C)  SpO2: 94%  Weight: 125 lb (  56.7 kg)  Height: 5\' 1"  (1.549 m)   Body mass index is 23.62 kg/m. Physical Exam Constitutional:      Appearance: Normal appearance.  HENT:     Right Ear: Tympanic membrane normal.     Left Ear: Tympanic membrane normal.     Mouth/Throat:     Mouth: Mucous membranes are moist.  Cardiovascular:     Rate and Rhythm: Normal rate and regular rhythm.     Pulses: Normal pulses.     Heart sounds: Normal heart sounds.  Pulmonary:     Effort: Pulmonary effort is normal.  Abdominal:     General: Abdomen is flat. Bowel sounds are normal.     Palpations: Abdomen is soft.  Musculoskeletal:        General: No swelling or tenderness.  Skin:    General: Skin is warm and dry.     Capillary Refill: Capillary refill takes less than 2 seconds.  Neurological:     Mental Status: She is alert and oriented to person, place, and time.     Gait: Gait normal.  Psychiatric:        Mood and Affect: Mood normal.     Labs reviewed: Basic Metabolic Panel: No results for input(s): "NA", "K", "CL", "CO2", "GLUCOSE", "BUN", "CREATININE", "CALCIUM", "MG", "PHOS", "TSH" in the last 8760 hours. Liver Function Tests: No results for input(s): "AST", "ALT", "ALKPHOS", "BILITOT", "PROT", "ALBUMIN" in the last 8760 hours. No results for input(s): "LIPASE", "AMYLASE" in the last 8760 hours. No results for input(s): "AMMONIA" in the last 8760 hours. CBC: No results for input(s): "WBC", "NEUTROABS", "HGB", "HCT", "MCV", "PLT" in the last 8760  hours. Lipid Panel: No results for input(s): "CHOL", "HDL", "LDLCALC", "TRIG", "CHOLHDL", "LDLDIRECT" in the last 8760 hours. No results found for: "HGBA1C"  Procedures since last visit: No results found.  Assessment/Plan Dysphagia, unspecified type - Plan: Ambulatory referral to Speech Therapy  Mood disorder (HCC), Chronic - Plan: escitalopram (LEXAPRO) 5 MG tablet  Aortic atherosclerosis (HCC), Chronic  Gastroesophageal reflux disease with esophagitis without hemorrhage  Hypothyroidism, unspecified type  Mixed stress and urge urinary incontinence  Scoliosis of thoracolumbar spine, unspecified scoliosis type  Major neurocognitive disorder [facility]) Patient presents today for establish care.She has had chronic issue of sinusitis for 1 year.  Discussed intranasal corticosteroids and advised patient she could start intranasal corticosteroids.  Declined at this time.  Patient with significant anxiety with use of Xanax 3 times weekly as well as daily use of SSRI with Lexapro.  Discussed concern for blackbox warning for patients older greater than 65 who use benzodiazepines such as increased risk of fall, dementia, and even death.  Patient would like to continue at this time we will increase dose of Lexapro and follow-up in 1 month to determine patient's usage of Xanax.  History of GERD well-controlled with omeprazole.  Some concern that patient has oropharyngeal phase dysphagia contributing to her symptoms we will plan for evaluation with speech therapy.  History of hypothyroidism well-controlled with 100 mcg of levothyroxine daily continue current dosing.  Make stress and urge incontinence discussed possibility of improvement with Kegel exercises.  Information provided for Kegel exercises.  Discussed concern for side effects of medications for urge incontinence and deferred at this time.  Will continue to monitor and follow-up with symptoms and 1 month.  Patient with 19 out of 30 on slums evaluation  today.  Discussed this is a screening evaluation and offered further testing with neurology however patient declined as there  is no change in management at this time.  Will discuss potential treatments and follow-up appointment with medication such as donepezil or Namenda.  Will discuss collection of labs at follow-up appointment.  Patient will need evaluation of thyroid, lipid panel, CBC, CMP, A1c, B12, vitamin D level. Labs/tests ordered:  * No order type specified * Next appt:  03/06/2023

## 2023-01-16 NOTE — Patient Instructions (Addendum)
Please increase your LEXAPRO (Excitalopram) to 10 mg daily. I have sent a new prescription for this medication.   For the xanax, please use as sparingly as possible. This is the medication that can increase risk of falls, memory loss, and has risk of death. Your need for this medication may decrease with improved mood by increasing dose of lexpro.   I encourage 6-8 glasses of water per day.   I have ordered you speech therapy to evaluate your swallowing.   Please get your tetanus vaccine at [facility] at your soonest convenience.

## 2023-02-03 ENCOUNTER — Encounter: Payer: Medicare Other | Admitting: Nurse Practitioner

## 2023-02-04 DIAGNOSIS — R1310 Dysphagia, unspecified: Secondary | ICD-10-CM | POA: Diagnosis not present

## 2023-02-25 ENCOUNTER — Ambulatory Visit: Payer: Medicare Other | Admitting: Student

## 2023-02-25 ENCOUNTER — Encounter: Payer: Self-pay | Admitting: Student

## 2023-02-25 VITALS — BP 126/62 | HR 70 | Temp 97.9°F | Ht 61.0 in | Wt 120.0 lb

## 2023-02-25 DIAGNOSIS — K449 Diaphragmatic hernia without obstruction or gangrene: Secondary | ICD-10-CM

## 2023-02-25 DIAGNOSIS — E2839 Other primary ovarian failure: Secondary | ICD-10-CM

## 2023-02-25 DIAGNOSIS — R1310 Dysphagia, unspecified: Secondary | ICD-10-CM

## 2023-02-25 DIAGNOSIS — R109 Unspecified abdominal pain: Secondary | ICD-10-CM | POA: Diagnosis not present

## 2023-02-25 NOTE — Patient Instructions (Addendum)
Please increase prilosec (omeprazole) to 40 mg daily (2 tablets daily). I have written a referral for the GI doctors.   Please make an appointment with on campus dermatology to address the area on your face.   Please get your x-ray done for your stomach and your bone density. Please call the radiology clinic to schedule.  [phone]  Here is the address for the x-ray: [address] [place], [place] [address]

## 2023-02-25 NOTE — Progress Notes (Signed)
[facility].   Provider: Dr. Earnestine Mealing  Code Status: DNR Goals of Care:     02/25/2023    1:24 PM  Advanced Directives  Does Patient Have a Medical Advance Directive? Yes  Type of Estate agent of [place];Out of facility DNR (pink MOST or yellow form);Living will  Does patient want to make changes to medical advance directive? No - Patient declined  Copy of Healthcare Power of Attorney in Chart? No - copy requested     Chief Complaint  Patient presents with   Acute Visit    Complains of Left sided pain that comes and goes. And complains of Digestive issues, feels that food doesn't go down.     HPI: Patient is a 88 y.o. female seen today for an acute visit for Left Sided pain that comes and goes and Digestive Issues, feels that food doesn't go down.   She has had an issue for a long time. Used to be on the right side. Hx of SBO in 2019. She has significant curvature in her back. Hx of strictures - 20 years since her last dilation. Lying down helps as well.   She wears a belt which helps the bulge on her left side she feels more supported with the belt on.  She has had some trouble with food going down.   She has more trouble with solids.   She doesn't want to do anything to prevent helping Bo Merino anytime soon.    Past Medical History:  Diagnosis Date   Anxiety    Arthritis    finger!!   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 if patient gets up during meal to let food pass, then n & v is not so bad   Midgut volvulus    SBO (small bowel obstruction) (HCC) 11/13/2016   Small bowel obstruction (HCC)    Small bowel obstruction due to postoperative adhesions 06/29/2017   Thyroid disease     Past Surgical History:   Procedure Laterality Date   APPENDECTOMY  2002   LAPAROTOMY N/A 09/28/2017   Procedure: EXPLORATORY LAPAROTOMY WITH LYSIS OF ADHESIONS;  Surgeon: Ancil Linsey, MD;  Location: [facility] ORS;  Service: General;  Laterality: N/A;   PARTIAL COLECTOMY  2002   large polyp   TONSILLECTOMY     TUBAL LIGATION      Allergies  Allergen Reactions   Codeine Nausea And Vomiting   Adhesive [Tape] Rash    Paper tape is okay   Latex Rash    Probably adhesive allergy.  bandaids bother patient    Outpatient Encounter Medications as of 02/25/2023  Medication Sig   acetaminophen (TYLENOL) 500 MG tablet Take 500 mg by mouth every 6 (six) hours as needed.   ALPRAZolam (XANAX) 0.25 MG tablet TAKE 1/2 TO 1 TABLET(0.125 TO 0.25 MG) BY MOUTH THREE TIMES DAILY AS NEEDED FOR ANXIETY   BIOTIN PO Take by mouth.   cholecalciferol (VITAMIN D) 1000 units tablet Take 1,000 Units by mouth daily.   Cyanocobalamin (VITAMIN B-12) 6000 MCG SUBL Place 1 tablet under the tongue daily.   escitalopram (LEXAPRO) 5 MG tablet Take 2 tablets (10 mg total) by mouth daily.   levothyroxine (SYNTHROID) 100 MCG tablet TAKE 1 TABLET(100 MCG) BY MOUTH  DAILY   meclizine (ANTIVERT) 25 MG tablet Take 25 mg by mouth 3 (three) times daily as needed for dizziness.   Multiple Vitamins-Minerals (MULTIVITAMIN WITH MINERALS) tablet Take 1 tablet by mouth daily.   omeprazole (PRILOSEC) 20 MG capsule Take 40 mg by mouth daily.   polyethylene glycol (MIRALAX / GLYCOLAX) packet Take 17 g by mouth daily.   PREVIDENT 5000 PLUS 1.1 % CREA dental cream SMARTSIG:Application By Mouth   simethicone (MYLICON) 80 MG chewable tablet Chew 80 mg by mouth every 6 (six) hours as needed for flatulence.   vitamin E 100 UNIT capsule Take 100 Units by mouth daily.   No facility-administered encounter medications on file as of 02/25/2023.    Review of Systems:  Review of Systems  Health Maintenance  Topic Date Due   DEXA SCAN  Never done   DTaP/Tdap/Td (2 -  Td or Tdap) 11/12/2016   Medicare Annual Wellness (AWV)  01/02/2023   COVID-19 Vaccine (8 - 2023-24 season) 02/18/2023   Pneumonia Vaccine 59+ Years old  Completed   INFLUENZA VACCINE  Completed   Zoster Vaccines- Shingrix  Completed   HPV VACCINES  Aged Out    Physical Exam: Vitals:   02/25/23 1321  BP: 126/62  Pulse: 70  Temp: 97.9 F (36.6 C)  SpO2: 96%  Weight: 120 lb (54.4 kg)  Height: 5\' 1"  (1.549 m)   Body mass index is 22.67 kg/m. Physical Exam Cardiovascular:     Rate and Rhythm: Normal rate.  Pulmonary:     Effort: Pulmonary effort is normal.  Abdominal:     Comments: Soft, normal bowel sounds  Musculoskeletal:     Comments: Thoracic right spinal deviation and kyphosis  Neurological:     Mental Status: She is alert. Mental status is at baseline.     Labs reviewed: Basic Metabolic Panel: No results for input(s): "NA", "K", "CL", "CO2", "GLUCOSE", "BUN", "CREATININE", "CALCIUM", "MG", "PHOS", "TSH" in the last 8760 hours. Liver Function Tests: No results for input(s): "AST", "ALT", "ALKPHOS", "BILITOT", "PROT", "ALBUMIN" in the last 8760 hours. No results for input(s): "LIPASE", "AMYLASE" in the last 8760 hours. No results for input(s): "AMMONIA" in the last 8760 hours. CBC: No results for input(s): "WBC", "NEUTROABS", "HGB", "HCT", "MCV", "PLT" in the last 8760 hours. Lipid Panel: No results for input(s): "CHOL", "HDL", "LDLCALC", "TRIG", "CHOLHDL", "LDLDIRECT" in the last 8760 hours. No results found for: "HGBA1C"  Procedures since last visit: No results found.  Assessment/Plan Hiatal hernia - Plan: Ambulatory referral to Gastroenterology, CANCELED: Ambulatory referral to Gastroenterology  Abdominal pain, unspecified abdominal location - Plan: DG Abd 1 View  Estrogen deficiency - Plan: DG BONE DENSITY (DXA)  Dysphagia, unspecified type - Plan: SLP modified barium swallow Patient with history of hiatal already had most recent evaluation in 2019.   Progression of symptoms with dysphagia.  Discussed increasing her omeprazole.  Referral to GI for endoscopy and further evaluation.  Barium swallow order per request by speech therapist on campus given history of dysphagia.  Follow-up in 1 week to make sure patient has appointments and KUB.  Labs/tests ordered:  * No order type specified * Next appt:  03/06/2023

## 2023-02-27 ENCOUNTER — Encounter: Payer: Self-pay | Admitting: Student

## 2023-03-03 ENCOUNTER — Other Ambulatory Visit: Payer: Self-pay | Admitting: Internal Medicine

## 2023-03-03 DIAGNOSIS — F39 Unspecified mood [affective] disorder: Secondary | ICD-10-CM

## 2023-03-05 DIAGNOSIS — L820 Inflamed seborrheic keratosis: Secondary | ICD-10-CM | POA: Diagnosis not present

## 2023-03-05 DIAGNOSIS — L570 Actinic keratosis: Secondary | ICD-10-CM | POA: Diagnosis not present

## 2023-03-05 DIAGNOSIS — L814 Other melanin hyperpigmentation: Secondary | ICD-10-CM | POA: Diagnosis not present

## 2023-03-05 DIAGNOSIS — L821 Other seborrheic keratosis: Secondary | ICD-10-CM | POA: Diagnosis not present

## 2023-03-06 ENCOUNTER — Ambulatory Visit (INDEPENDENT_AMBULATORY_CARE_PROVIDER_SITE_OTHER): Payer: Medicare Other | Admitting: Student

## 2023-03-06 ENCOUNTER — Encounter: Payer: Self-pay | Admitting: Student

## 2023-03-06 VITALS — BP 132/74 | HR 68 | Temp 97.8°F | Ht 61.0 in | Wt 125.0 lb

## 2023-03-06 DIAGNOSIS — F39 Unspecified mood [affective] disorder: Secondary | ICD-10-CM

## 2023-03-06 DIAGNOSIS — Z0000 Encounter for general adult medical examination without abnormal findings: Secondary | ICD-10-CM

## 2023-03-06 DIAGNOSIS — F0390 Unspecified dementia without behavioral disturbance: Secondary | ICD-10-CM

## 2023-03-06 MED ORDER — ESCITALOPRAM OXALATE 5 MG PO TABS: 5.0000 mg | ORAL_TABLET | ORAL | 5 refills | Status: DC

## 2023-03-06 NOTE — Progress Notes (Signed)
Subjective:   Diana Owen is a 88 y.o. female who presents for Medicare Annual (Subsequent) preventive examination.  Visit Complete: In person  Patient Medicare AWV questionnaire was completed by the patient on yes; I have confirmed that all information answered by patient is correct and no changes since this date.        Objective:    Today's Vitals   03/06/23 1331  BP: 132/74  Pulse: 68  Temp: 97.8 F (36.6 C)  SpO2: 99%  Weight: 125 lb (56.7 kg)  Height: 5\' 1"  (1.549 m)   Body mass index is 23.62 kg/m.     03/06/2023    1:34 PM 02/25/2023    1:24 PM 01/16/2023    1:27 PM 10/17/2020    9:18 AM 09/28/2017   11:17 AM 09/24/2017   11:44 AM 08/26/2017    2:02 AM  Advanced Directives  Does Patient Have a Medical Advance Directive? Yes Yes Yes Yes Yes Yes Yes  Type of Estate agent of [place];Out of facility DNR (pink MOST or yellow form);Living will Healthcare Power of [place];Out of facility DNR (pink MOST or yellow form);Living will Healthcare Power of [place];Out of facility DNR (pink MOST or yellow form);Living will Out of facility DNR (pink MOST or yellow form);Healthcare Power of [employer] of [place];Living will Healthcare Power of [place];Living will Healthcare Power of [place];Living will  Does patient want to make changes to medical advance directive? No - Patient declined No - Patient declined No - Patient declined  No - Patient declined No - Patient declined No - Patient declined  Copy of Healthcare Power of Attorney in Chart? No - copy requested No - copy requested No - copy requested  No - copy requested No - copy requested No - copy requested    Current Medications (verified) Outpatient Encounter Medications as of 03/06/2023  Medication Sig   acetaminophen (TYLENOL) 500 MG tablet Take 500 mg by mouth every 6 (six) hours as needed.   ALPRAZolam (XANAX) 0.25 MG tablet TAKE 1/2 TO 1 TABLET(0.125 TO 0.25 MG) BY MOUTH THREE  TIMES DAILY AS NEEDED FOR ANXIETY   BIOTIN PO Take by mouth.   cholecalciferol (VITAMIN D) 1000 units tablet Take 1,000 Units by mouth daily.   Cyanocobalamin (VITAMIN B-12) 6000 MCG SUBL Place 1 tablet under the tongue daily.   levothyroxine (SYNTHROID) 100 MCG tablet TAKE 1 TABLET(100 MCG) BY MOUTH DAILY   meclizine (ANTIVERT) 25 MG tablet Take 25 mg by mouth 3 (three) times daily as needed for dizziness.   Multiple Vitamins-Minerals (MULTIVITAMIN WITH MINERALS) tablet Take 1 tablet by mouth daily.   omeprazole (PRILOSEC) 20 MG capsule Take 40 mg by mouth daily.   polyethylene glycol (MIRALAX / GLYCOLAX) packet Take 17 g by mouth daily.   PREVIDENT 5000 PLUS 1.1 % CREA dental cream SMARTSIG:Application By Mouth   simethicone (MYLICON) 80 MG chewable tablet Chew 80 mg by mouth every 6 (six) hours as needed for flatulence.   vitamin E 100 UNIT capsule Take 100 Units by mouth daily.   [DISCONTINUED] escitalopram (LEXAPRO) 5 MG tablet Take 2 tablets (10 mg total) by mouth daily. (Patient taking differently: Take 5 mg by mouth every other day.)   [DISCONTINUED] escitalopram (LEXAPRO) 5 MG tablet Take 5 mg by mouth every other day.   escitalopram (LEXAPRO) 5 MG tablet Take 1 tablet (5 mg total) by mouth every other day.   No facility-administered encounter medications on file as of 03/06/2023.  Allergies (verified) Codeine, Adhesive [tape], and Latex   History: Past Medical History:  Diagnosis Date   Anxiety    Arthritis    finger!!   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 if patient gets up during meal to let food pass, then n & v is not so bad   Midgut volvulus    SBO (small bowel obstruction) (HCC) 11/13/2016   Small bowel obstruction (HCC)    Small bowel obstruction  due to postoperative adhesions 06/29/2017   Thyroid disease    Past Surgical History:  Procedure Laterality Date   APPENDECTOMY  2002   LAPAROTOMY N/A 09/28/2017   Procedure: EXPLORATORY LAPAROTOMY WITH LYSIS OF ADHESIONS;  Surgeon: Ancil Linsey, MD;  Location: [facility] ORS;  Service: General;  Laterality: N/A;   PARTIAL COLECTOMY  2002   large polyp   TONSILLECTOMY     TUBAL LIGATION     Family History  Problem Relation Age of Onset   Leukemia Mother        CLL   Leukemia Father    Hyperlipidemia Brother    Stroke Brother    Heart disease Paternal Uncle    Diabetes Neg Hx    Social History   Socioeconomic History   Marital status: Married    Spouse name: Not on file   Number of children: 3   Years of education: Not on file   Highest education level: Not on file  Occupational History   Occupation: Diplomatic Services operational officer   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    Comment: retired  Tobacco Use   Smoking status: Never    Passive exposure: Past   Smokeless tobacco: Never  Vaping Use   Vaping status: Never Used  Substance and Sexual Activity   Alcohol use: Yes    Comment: rare wine    Drug use: No   Sexual activity: Yes  Other Topics Concern   Not on file  Social History Narrative   2 sons, 1 daughter--- Brett [place] in [place] in [place], Selinda [place] in [place]      Has a living will   Daughter [name], then sons, should make health care decisions   Would accept resuscitation attempts   Not sure about tube feeds---but not if cognitively unaware   Social Drivers of Health   Financial Resource Strain: Low Risk  (04/18/2021)   Overall Financial Resource Strain (CARDIA)    Difficulty of Paying Living Expenses: Not hard at all  Food Insecurity: No Food Insecurity (04/18/2021)   Hunger Vital Sign    Worried About Running Out of Food in the Last Year: Never true    Ran Out of Food in the Last Year: Never true  Transportation Needs: Not on file  Physical Activity: Not on file  Stress: Not on  file  Social Connections: Not on file    Tobacco Counseling Counseling given: Not Answered   Clinical Intake:  Pre-visit preparation completed: Yes  Pain :  (Some stomach pain improved with water)     Nutritional Status: BMI of 19-24  Normal Nutritional Risks: None Diabetes: No  How often do you need to have someone help you when you read instructions, pamphlets, or other written materials from your doctor or pharmacy?: 1 - Never What is the last  grade level you completed in school?: 2 years college  Interpreter Needed?: No      Activities of Daily Living     No data to display          Patient Care Team: Earnestine Mealing, MD as PCP - General (Family Medicine) Kathyrn Sheriff, [facility] (Inactive) as Pharmacist (Pharmacist)  Indicate any recent Medical Services you may have received from other than [facility] providers in the past year (date may be approximate).     Assessment:   This is a routine wellness examination for Diana Owen.  Hearing/Vision screen Vision Screening - Comments:: [facility] Last Exam: 09/2022   Goals Addressed             This Visit's Progress    Manage My Medicine   Not on track    Timeframe:  Long-Range Goal Priority:  Medium Start Date:   04/18/21                          Expected End Date:  04/18/22                      Follow Up Date June 2023   - call for medicine refill 2 or 3 days before it runs out - call if I am sick and can't take my medicine - keep a list of all the medicines I take; vitamins and herbals too - use a pillbox to sort medicine  -Resume taking levothyroxine with breakfast (as previous)   Why is this important?   These steps will help you keep on track with your medicines.   Notes:        Depression Screen    03/06/2023    1:35 PM 02/25/2023    1:24 PM 01/16/2023    1:28 PM 01/01/2022   11:19 AM 01/01/2021   11:32 AM 01/01/2021   10:58 AM 12/16/2019   11:10 AM  PHQ 2/9 Scores  PHQ - 2 Score  0 0 0 1 1 0   Exception Documentation       Medical reason    Fall Risk    03/06/2023    2:15 PM 03/06/2023    1:35 PM 02/25/2023    1:24 PM 01/16/2023    1:28 PM 01/01/2022   11:19 AM  Fall Risk   Falls in the past year? 1 0 0 0 0  Number falls in past yr: 0 0 0 0   Injury with Fall? 0 0 0 0     MEDICARE RISK AT HOME:   She does wants to stay at home as much as possible.  TIMED UP AND GO:  Was the test performed?  Yes  Length of time to ambulate 10 feet: 21 sec Gait unsteady with use of assistive device, provider informed and education provided.     Cognitive Function:        03/06/2023    1:35 PM  6CIT Screen  What Year? 0 points  What month? 0 points  What time? 0 points  Count back from 20 0 points  Months in reverse 0 points  Repeat phrase 0 points  Total Score 0 points    Immunizations Immunization History  Administered Date(s) Administered   Fluad Quad(high Dose 65+) 01/01/2022   Influenza Split 12/27/2015   Influenza,inj,Quad PF,6+ Mos 11/21/2016, 11/26/2017   Influenza-Unspecified 11/16/2013, 12/25/2014, 12/24/2022   Moderna Covid-19 Vaccine Bivalent Booster 65yrs & up 12/06/2020, 06/24/2022  Moderna Sars-Covid-2 Vaccination 03/29/2019, 04/26/2019, 08/02/2020, 08/02/2020, 12/24/2022   Pneumococcal Conjugate-13 12/25/2014   Pneumococcal Polysaccharide-23 11/09/2009, 11/26/2017   RSV,unspecified 02/21/2022   Tdap 11/13/2006   Zoster Recombinant(Shingrix) 11/20/2021, 01/23/2022   Zoster, Live 09/28/2009    TDAP status: Due, Education has been provided regarding the importance of this vaccine. Advised may receive this vaccine at local pharmacy or Health Dept. Aware to provide a copy of the vaccination record if obtained from local pharmacy or Health Dept. Verbalized acceptance and understanding.  Flu Vaccine status: Up to date  Pneumococcal vaccine status: Up to date  Covid-19 vaccine status: Information provided on how to obtain vaccines.    Qualifies for Shingles Vaccine? Yes   Zostavax completed Yes   Shingrix Completed?: Yes  Screening Tests Health Maintenance  Topic Date Due   DEXA SCAN  Never done   DTaP/Tdap/Td (2 - Td or Tdap) 11/12/2016   COVID-19 Vaccine (8 - 2024-25 season) 02/18/2023   Medicare Annual Wellness (AWV)  03/05/2024   Pneumonia Vaccine 41+ Years old  Completed   INFLUENZA VACCINE  Completed   Zoster Vaccines- Shingrix  Completed   HPV VACCINES  Aged Out    Health Maintenance  Health Maintenance Due  Topic Date Due   DEXA SCAN  Never done   DTaP/Tdap/Td (2 - Td or Tdap) 11/12/2016   COVID-19 Vaccine (8 - 2024-25 season) 02/18/2023    Colorectal cancer screening: No longer required.   Mammogram status: No longer required due to age.  Bone Density status: Ordered needs to be scheduled. Pt provided with contact info and advised to call to schedule appt.  Lung Cancer Screening: (Low Dose CT Chest recommended if Age 58-80 years, 20 pack-year currently smoking OR have quit w/in 15years.) does not qualify.   Lung Cancer Screening Referral: NA  Additional Screening:  Hepatitis C Screening: does not qualify; Completed   Vision Screening: Recommended annual ophthalmology exams for early detection of glaucoma and other disorders of the eye. Is the patient up to date with their annual eye exam?  Yes  Who is the provider or what is the name of the office in which the patient attends annual eye exams? [facility] If pt is not established with a provider, would they like to be referred to a provider to establish care? established.   Dental Screening: Recommended annual dental exams for proper oral hygiene  Diabetic Foot Exam: NA  Community Resource Referral / Chronic Care Management: CRR required this visit?  No   CCM required this visit?  No     Plan:     I have personally reviewed and noted the following in the patient's chart:   Medical and social history Use of alcohol,  tobacco or illicit drugs  Current medications and supplements including opioid prescriptions. Patient is not currently taking opioid prescriptions. Functional ability and status Nutritional status Physical activity Advanced directives List of other physicians Hospitalizations, surgeries, and ER visits in previous 12 months Vitals Screenings to include cognitive, depression, and falls Referrals and appointments  In addition, I have reviewed and discussed with patient certain preventive protocols, quality metrics, and best practice recommendations. A written personalized care plan for preventive services as well as general preventive health recommendations were provided to patient.     Earnestine Mealing, MD   03/06/2023   After Visit Summary: (In Person-Printed) AVS printed and given to the patient  Nurse Notes: reviewed

## 2023-03-06 NOTE — Patient Instructions (Addendum)
Please get your tetanus vaccine at your pharmacy at your soonest convenience.   Please scheduled transport for Dexa is 2/27 at 11am   Bone Density Test A bone density test uses a type of X-ray to measure the amount of calcium and other minerals in a person's bones. It can measure bone density in the hip and the spine. The test is similar to having a regular X-ray. This test may also be called: Bone densitometry. Bone mineral density test. Dual-energy X-ray absorptiometry (DEXA). You may have this test to: Diagnose a condition that causes weak or thin bones (osteoporosis). Screen you for osteoporosis. Predict your risk for a broken bone (fracture). Determine how well your osteoporosis treatment is working. Tell a health care provider about: Any allergies you have. All medicines you are taking, including vitamins, herbs, eye drops, creams, and over-the-counter medicines. Any problems you or family members have had with anesthetic medicines. Any blood disorders you have. Any surgeries you have had. Any medical conditions you have. Whether you are pregnant or may be pregnant. Any medical tests you have had within the past 14 days that used contrast material. What are the risks? Generally, this is a safe test. However, it does expose you to a small amount of radiation, which can slightly increase your cancer risk. What happens before the test? Do not take any calcium supplements within the 24 hours before your test. You will need to remove all metal jewelry, eyeglasses, removable dental appliances, and any other metal objects on your body. What happens during the test?  You will lie down on an exam table. There will be an X-ray generator below you and an imaging device above you. Other devices, such as boxes or braces, may be used to position your body properly for the scan. The machine will slowly scan your body. You will need to keep very still while the machine does the scan. The  images will show up on a screen in the room. Images will be examined by a specialist after your test is finished. The procedure may vary among health care providers and hospitals. What can I expect after the test? It is up to you to get the results of your test. Ask your health care provider, or the department that is doing the test, when your results will be ready. Summary A bone density test is an imaging test that uses a type of X-ray to measure the amount of calcium and other minerals in your bones. The test may be used to diagnose or screen you for a condition that causes weak or thin bones (osteoporosis), predict your risk for a broken bone (fracture), or determine how well your osteoporosis treatment is working. Do not take any calcium supplements within 24 hours before your test. Ask your health care provider, or the department that is doing the test, when your results will be ready. This information is not intended to replace advice given to you by your health care provider. Make sure you discuss any questions you have with your health care provider. Document Revised: 11/14/2020 Document Reviewed: 08/18/2019 Elsevier Patient Education  2024 [employer].  Fall Prevention in the Home, Adult Falls can cause injuries and can happen to people of all ages. There are many things you can do to make your home safer and to help prevent falls. What actions can I take to prevent falls? General information Use good lighting in all rooms. Make sure to: Replace any light bulbs that burn out. Turn on  the lights in dark areas and use night-lights. Keep items that you use often in easy-to-reach places. Lower the shelves around your home if needed. Move furniture so that there are clear paths around it. Do not use throw rugs or other things on the floor that can make you trip. If any of your floors are uneven, fix them. Add color or contrast paint or tape to clearly mark and help you see: Grab bars  or handrails. First and last steps of staircases. Where the edge of each step is. If you use a ladder or stepladder: Make sure that it is fully opened. Do not climb a closed ladder. Make sure the sides of the ladder are locked in place. Have someone hold the ladder while you use it. Know where your pets are as you move through your home. What can I do in the bathroom?     Keep the floor dry. Clean up any water on the floor right away. Remove soap buildup in the bathtub or shower. Buildup makes bathtubs and showers slippery. Use non-skid mats or decals on the floor of the bathtub or shower. Attach bath mats securely with double-sided, non-slip rug tape. If you need to sit down in the shower, use a non-slip stool. Install grab bars by the toilet and in the bathtub and shower. Do not use towel bars as grab bars. What can I do in the bedroom? Make sure that you have a light by your bed that is easy to reach. Do not use any sheets or blankets on your bed that hang to the floor. Have a firm chair or bench with side arms that you can use for support when you get dressed. What can I do in the kitchen? Clean up any spills right away. If you need to reach something above you, use a step stool with a grab bar. Keep electrical cords out of the way. Do not use floor polish or wax that makes floors slippery. What can I do with my stairs? Do not leave anything on the stairs. Make sure that you have a light switch at the top and the bottom of the stairs. Make sure that there are handrails on both sides of the stairs. Fix handrails that are broken or loose. Install non-slip stair treads on all your stairs if they do not have carpet. Avoid having throw rugs at the top or bottom of the stairs. Choose a carpet that does not hide the edge of the steps on the stairs. Make sure that the carpet is firmly attached to the stairs. Fix carpet that is loose or worn. What can I do on the outside of my home? Use  bright outdoor lighting. Fix the edges of walkways and driveways and fix any cracks. Clear paths of anything that can make you trip, such as tools or rocks. Add color or contrast paint or tape to clearly mark and help you see anything that might make you trip as you walk through a door, such as a raised step or threshold. Trim any bushes or trees on paths to your home. Check to see if handrails are loose or broken and that both sides of all steps have handrails. Install guardrails along the edges of any raised decks and porches. Have leaves, snow, or ice cleared regularly. Use sand, salt, or ice melter on paths if you live where there is ice and snow during the winter. Clean up any spills in your garage right away. This includes grease or  oil spills. What other actions can I take? Review your medicines with your doctor. Some medicines can cause dizziness or changes in blood pressure, which increase your risk of falling. Wear shoes that: Have a low heel. Do not wear high heels. Have rubber bottoms and are closed at the toe. Feel good on your feet and fit well. Use tools that help you move around if needed. These include: Canes. Walkers. Scooters. Crutches. Ask your doctor what else you can do to help prevent falls. This may include seeing a physical therapist to learn to do exercises to move better and get stronger. Where to find more information Centers for Disease Control and Prevention, STEADI: TonerPromos.no [employer] on Aging: BaseRingTones.pl National Institute on Aging: BaseRingTones.pl Contact a doctor if: You are afraid of falling at home. You feel weak, drowsy, or dizzy at home. You fall at home. Get help right away if you: Lose consciousness or have trouble moving after a fall. Have a fall that causes a head injury. These symptoms may be an emergency. Get help right away. Call 911. Do not wait to see if the symptoms will go away. Do not drive yourself to the hospital. This  information is not intended to replace advice given to you by your health care provider. Make sure you discuss any questions you have with your health care provider. Document Revised: 11/04/2021 Document Reviewed: 11/04/2021 Elsevier Patient Education  2024 [employer].

## 2023-03-09 ENCOUNTER — Other Ambulatory Visit: Payer: Self-pay | Admitting: Student

## 2023-03-09 DIAGNOSIS — R09A2 Foreign body sensation, throat: Secondary | ICD-10-CM

## 2023-03-09 DIAGNOSIS — R1310 Dysphagia, unspecified: Secondary | ICD-10-CM

## 2023-03-09 DIAGNOSIS — R633 Feeding difficulties, unspecified: Secondary | ICD-10-CM

## 2023-03-27 ENCOUNTER — Ambulatory Visit: Payer: Medicare HMO | Attending: Student

## 2023-04-01 ENCOUNTER — Ambulatory Visit
Admission: RE | Admit: 2023-04-01 | Discharge: 2023-04-01 | Disposition: A | Discharge status: Home / Self Care | Payer: Medicare HMO | Attending: Student | Admitting: Student

## 2023-04-01 ENCOUNTER — Ambulatory Visit
Admission: RE | Admit: 2023-04-01 | Discharge: 2023-04-01 | Disposition: A | Discharge status: Home / Self Care | Payer: Medicare HMO | Source: Ambulatory Visit | Attending: Student | Admitting: Student

## 2023-04-01 DIAGNOSIS — K5900 Constipation, unspecified: Secondary | ICD-10-CM | POA: Diagnosis not present

## 2023-04-01 DIAGNOSIS — R109 Unspecified abdominal pain: Secondary | ICD-10-CM | POA: Diagnosis not present

## 2023-04-01 DIAGNOSIS — K56609 Unspecified intestinal obstruction, unspecified as to partial versus complete obstruction: Secondary | ICD-10-CM | POA: Diagnosis not present

## 2023-04-16 ENCOUNTER — Ambulatory Visit: Payer: Medicare Other | Admitting: Physician Assistant

## 2023-04-17 ENCOUNTER — Encounter: Payer: Self-pay | Admitting: Student

## 2023-05-14 ENCOUNTER — Ambulatory Visit
Admission: RE | Admit: 2023-05-14 | Discharge: 2023-05-14 | Disposition: A | Discharge status: Home / Self Care | Payer: Medicare HMO | Source: Ambulatory Visit | Attending: Student | Admitting: Student

## 2023-05-14 DIAGNOSIS — E2839 Other primary ovarian failure: Secondary | ICD-10-CM | POA: Insufficient documentation

## 2023-05-14 DIAGNOSIS — M85832 Other specified disorders of bone density and structure, left forearm: Secondary | ICD-10-CM | POA: Diagnosis not present

## 2023-05-14 DIAGNOSIS — M85852 Other specified disorders of bone density and structure, left thigh: Secondary | ICD-10-CM | POA: Diagnosis not present

## 2023-05-14 LAB — HM DEXA SCAN

## 2023-05-15 ENCOUNTER — Encounter: Payer: Self-pay | Admitting: Student

## 2023-05-15 ENCOUNTER — Ambulatory Visit: Payer: Medicare Other | Admitting: Student

## 2023-05-15 VITALS — BP 136/76 | HR 66 | Temp 96.4°F | Ht 61.0 in | Wt 127.0 lb

## 2023-05-15 DIAGNOSIS — E039 Hypothyroidism, unspecified: Secondary | ICD-10-CM | POA: Diagnosis not present

## 2023-05-15 DIAGNOSIS — E785 Hyperlipidemia, unspecified: Secondary | ICD-10-CM | POA: Diagnosis not present

## 2023-05-15 DIAGNOSIS — M8589 Other specified disorders of bone density and structure, multiple sites: Secondary | ICD-10-CM | POA: Diagnosis not present

## 2023-05-15 DIAGNOSIS — F0390 Unspecified dementia without behavioral disturbance: Secondary | ICD-10-CM | POA: Diagnosis not present

## 2023-05-15 DIAGNOSIS — F39 Unspecified mood [affective] disorder: Secondary | ICD-10-CM | POA: Diagnosis not present

## 2023-05-15 DIAGNOSIS — R739 Hyperglycemia, unspecified: Secondary | ICD-10-CM

## 2023-05-15 MED ORDER — ESCITALOPRAM OXALATE 5 MG PO TABS: 5.0000 mg | ORAL_TABLET | ORAL | 1 refills | Status: DC

## 2023-05-15 MED ORDER — LEVOTHYROXINE SODIUM 100 MCG PO TABS: 100.0000 ug | ORAL_TABLET | Freq: Every day | ORAL | 3 refills | Status: DC

## 2023-05-15 NOTE — Patient Instructions (Signed)
 VISIT SUMMARY:  Today, we discussed your current medications and bone health. We reviewed your medication regimen, including refills and adjustments, and addressed your recent bone density test results. We also talked about your diet, exercise routine, and general health maintenance.  YOUR PLAN:  -MEDICATION MANAGEMENT: We discussed your current medications and made plans for refills. You will receive a 90-day refill for acetylcholine, and escitalopram will be refilled as needed. We will avoid refilling Xanax unless your symptoms become unbearable, due to potential dependency and adverse effects.  -GASTROESOPHAGEAL REFLUX DISEASE (GERD): GERD is a condition where stomach acid frequently flows back into the tube connecting your mouth and stomach. You will continue taking omeprazole every other day to manage your acid reflux and avoid potential side effects of daily use.  -OSTEOPENIA: Osteopenia is a condition where bone density is lower than normal but not low enough to be classified as osteoporosis. You should continue your exercise regimen and take vitamin D and calcium supplements to maintain bone density and reduce the risk of fractures.  -GENERAL HEALTH MAINTENANCE: You are maintaining a healthy diet with more vegetables and less sugar, consuming a can of Boost daily for protein, and taking vitamin E and B12 supplements. You are also engaging in regular physical activity, including walking with a walker and performing leg exercises. We will schedule blood collection at home to check for anemia, cholesterol, liver and kidney function, thyroid, vitamin B12, A1c, and vitamin D levels.  INSTRUCTIONS:  Please attend your gastroenterology appointment on March 25th and schedule a follow-up visit with [place] in three month with labs collected in your home before.

## 2023-05-15 NOTE — Progress Notes (Signed)
 [facility] Provider: Sydnee Cabal  Code Status: DNR Goals of Care:     03/06/2023    1:34 PM  Advanced Directives  Does Patient Have a Medical Advance Directive? Yes  Type of Estate agent of [place];Out of facility DNR (pink MOST or yellow form);Living will  Does patient want to make changes to medical advance directive? No - Patient declined  Copy of Healthcare Power of Attorney in Chart? No - copy requested     Chief Complaint  Patient presents with   Medical Management of Chronic Issues    Medical Management of Chronic Issues. 2 Month follow up    HPI: Patient is a 89 y.o. female seen today for medical management of chronic diseases.   Discussed the use of AI scribe software for clinical note transcription with the patient, who gave verbal consent to proceed.  History of Present Illness   Diana Owen is an 89 year old female who presents for medication management and follow-up on bone health.  She is discussing her current medication regimen and requests to transfer her prescriptions. She is due for a refill of lexapro but not levothyroxine, which she takes at 5 mg once or twice a week. She has been without it for a week or two and uses wine as an alternative, consuming no more than half a cup a day and avoiding it when driving. She uses escitalopram for focus and depression, taking it three times a week. She has been avoiding Xanax and has managed without it for two weeks, preferring to continue without it unless necessary. She also takes vitamin E and omeprazole every other day for acid reflux.  She recently underwent a bone density test, which indicated osteopenia. She continues to take vitamin D3 and calcium supplements.  Her exercise routine includes walking with a walker, performing leg exercises at the sink, and walking around the house. She has increased her outdoor walking since having more time after her husband's  passing. She drives short distances and prefers to stay close to home.  Her diet includes consuming a can of Boost daily for protein, and she is trying to eat more vegetables and less sugar. She has maintained a stable weight and reports good blood pressure readings.         Past Medical History:  Diagnosis Date   Anxiety    Arthritis    finger!!   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 if patient gets up during meal to let food pass, then n & v is not so bad   Midgut volvulus    SBO (small bowel obstruction) (HCC) 11/13/2016   Small bowel obstruction (HCC)    Small bowel obstruction due to postoperative adhesions 06/29/2017   Thyroid disease     Past Surgical History:  Procedure Laterality Date   APPENDECTOMY  2002   LAPAROTOMY N/A 09/28/2017   Procedure: EXPLORATORY LAPAROTOMY WITH LYSIS OF ADHESIONS;  Surgeon: Ancil Linsey, MD;  Location: [facility] ORS;  Service: General;  Laterality: N/A;   PARTIAL COLECTOMY  2002   large polyp   TONSILLECTOMY     TUBAL LIGATION      Allergies  Allergen Reactions   Codeine Nausea And Vomiting   Adhesive [  Tape] Rash    Paper tape is okay   Latex Rash    Probably adhesive allergy.  bandaids bother patient    Outpatient Encounter Medications as of 05/15/2023  Medication Sig   acetaminophen (TYLENOL) 500 MG tablet Take 500 mg by mouth every 6 (six) hours as needed.   BIOTIN PO Take by mouth.   cholecalciferol (VITAMIN D) 1000 units tablet Take 2,000 Units by mouth daily.   Cyanocobalamin (VITAMIN B-12) 6000 MCG SUBL Place 1 tablet under the tongue daily.   meclizine (ANTIVERT) 25 MG tablet Take 25 mg by mouth 3 (three) times daily as needed for dizziness.   Multiple Vitamins-Minerals (MULTIVITAMIN WITH MINERALS) tablet Take  1 tablet by mouth daily.   omeprazole (PRILOSEC) 20 MG capsule Take 40 mg by mouth daily.   polyethylene glycol (MIRALAX / GLYCOLAX) packet Take 17 g by mouth daily.   PREVIDENT 5000 PLUS 1.1 % CREA dental cream SMARTSIG:Application By Mouth   simethicone (MYLICON) 80 MG chewable tablet Chew 80 mg by mouth every 6 (six) hours as needed for flatulence.   vitamin E 100 UNIT capsule Take 100 Units by mouth daily.   [DISCONTINUED] ALPRAZolam (XANAX) 0.25 MG tablet TAKE 1/2 TO 1 TABLET(0.125 TO 0.25 MG) BY MOUTH THREE TIMES DAILY AS NEEDED FOR ANXIETY   [DISCONTINUED] escitalopram (LEXAPRO) 5 MG tablet Take 1 tablet (5 mg total) by mouth every other day.   [DISCONTINUED] levothyroxine (SYNTHROID) 100 MCG tablet TAKE 1 TABLET(100 MCG) BY MOUTH DAILY   escitalopram (LEXAPRO) 5 MG tablet Take 1 tablet (5 mg total) by mouth every other day.   levothyroxine (SYNTHROID) 100 MCG tablet Take 1 tablet (100 mcg total) by mouth daily before breakfast.   No facility-administered encounter medications on file as of 05/15/2023.    Review of Systems:  Review of Systems  Health Maintenance  Topic Date Due   COVID-19 Vaccine (8 - 2024-25 season) 02/18/2023   Medicare Annual Wellness (AWV)  03/05/2024   DTaP/Tdap/Td (3 - Td or Tdap) 03/30/2033   Pneumonia Vaccine 27+ Years old  Completed   INFLUENZA VACCINE  Completed   DEXA SCAN  Completed   Zoster Vaccines- Shingrix  Completed   HPV VACCINES  Aged Out    Physical Exam: Vitals:   05/15/23 1528  BP: 136/76  Pulse: 66  Temp: (!) 96.4 F (35.8 C)  SpO2: 96%  Weight: 127 lb (57.6 kg)  Height: 5\' 1"  (1.549 m)   Body mass index is 24 kg/m. Physical Exam Constitutional:      Appearance: Normal appearance.  Cardiovascular:     Rate and Rhythm: Normal rate.     Pulses: Normal pulses.     Heart sounds: Normal heart sounds.  Pulmonary:     Effort: Pulmonary effort is normal.  Abdominal:     General: Abdomen is flat. Bowel sounds are normal.      Palpations: Abdomen is soft.  Musculoskeletal:        General: No swelling or tenderness.  Skin:    General: Skin is warm and dry.  Neurological:     Mental Status: She is alert.     Gait: Gait abnormal.     Comments: Repeats questions multiple times  Psychiatric:        Mood and Affect: Mood normal.     Procedures since last visit: DG BONE DENSITY (DXA) Result Date: 05/14/2023 EXAM: DUAL X-RAY ABSORPTIOMETRY (DXA) FOR BONE MINERAL DENSITY IMPRESSION: Your patient Diana Owen completed a BMD test on  05/14/2023 using the [employer] DXA System (software version: 14.10) manufactured by [employer]. The following summarizes the results of our evaluation. Technologist: SCE PATIENT BIOGRAPHICAL: Name: Diana Owen, Diana Owen Patient ID: [id number] Birth Date: [birth year] Height: 60.0 in. Gender: Female Exam Date: 05/14/2023 Weight: 125.1 lbs. Indications: Advanced Age, Caucasian, Height Loss, Hypothyroid, Postmenopausal, Scoliosis Fractures: Treatments: Levothyroxine, Multi-Vitamin, Omeprazole, Vitamin D DENSITOMETRY RESULTS: Site         Region     Measured Date Measured Age WHO Classification Young Adult T-score BMD         %Change vs. Previous Significant Change (*) DualFemur Neck Left 05/14/2023 [age] Osteopenia -1.8 0.787 g/cm2 - - Left Forearm Radius 33% 05/14/2023 89.0 Osteopenia -2.2 0.681 g/cm2 - - ASSESSMENT: The BMD measured at Forearm Radius 33% is 0.681 g/cm2 with a T-score of -2.2. This patient is considered osteopenic according to World Health Organization [facility]) criteria. The scan quality is good. Lumbar spine was not utilized due to advanced degenerative changes/scoliosis. World Science writer [facility]) criteria for post-menopausal, Caucasian Women: Normal:                   T-score at or above -1 SD Osteopenia/low bone mass: T-score between -1 and -2.5 SD Osteoporosis:             T-score at or below -2.5 SD RECOMMENDATIONS: 1. All patients should optimize calcium and vitamin D intake.  2. Consider FDA-approved medical therapies in postmenopausal women and men aged 76 years and older, based on the following: a. A hip or vertebral(clinical or morphometric) fracture b. T-score < -2.5 at the femoral neck or spine after appropriate evaluation to exclude secondary causes c. Low bone mass (T-score between -1.0 and -2.5 at the femoral neck or spine) and a 10-year probability of a hip fracture > 3% or a 10-year probability of a major osteoporosis-related fracture > 20% based on the [place]-adapted WHO algorithm 3. Clinician judgment and/or patient preferences may indicate treatment for people with 10-year fracture probabilities above or below these levels FOLLOW-UP: People with diagnosed cases of osteoporosis or at high risk for fracture should have regular bone mineral density tests. For patients eligible for Medicare, routine testing is allowed once every 2 years. The testing frequency can be increased to one year for patients who have rapidly progressing disease, those who are receiving or discontinuing medical therapy to restore bone mass, or have additional risk factors. I have reviewed this report, and agree with the above findings. [facility] Dear Diana Owen, Your patient Diana Owen completed a FRAX assessment on 05/14/2023 using the [facility] iDXA DXA System (analysis version: 14.10) manufactured by [employer]. The following summarizes the results of our evaluation. PATIENT BIOGRAPHICAL: Name: Diana Owen, Diana Owen Patient ID: [id number] Birth Date: [birth year] Height:    60.0 in. Gender:     Female    [age]       Weight:    125.1 lbs. Ethnicity:  White                            Exam Date: 05/14/2023 FRAX* RESULTS:  (version: 3.5) 10-year Probability of Fracture1 Major Osteoporotic Fracture2 Hip Fracture 13.8% 4.3% Population: [place] (Caucasian) Risk Factors: None Based on Femur (Left) Neck BMD 1 -The 10-year probability of fracture may be lower than reported if the patient has  received treatment. 2 -Major Osteoporotic Fracture: Clinical Spine, Forearm, Hip or Shoulder *  FRAX is a Armed forces logistics/support/administrative officer of the [employer] of [employer] for Metabolic Bone Disease, a World Science writer (WHO) [employer]. ASSESSMENT: The probability of a major osteoporotic fracture is 13.8% within the next ten years. The probability of a hip fracture is 4.3% within the next ten years. . Electronically Signed   By: Romona Curls M.D.   On: 05/14/2023 11:02   Results   RADIOLOGY Bone density: Osteopenia (05/14/2023)      Assessment/Plan    Medication Management Requires refills for acetylcholine and levothyroxine. Takes acetylcholine irregularly, about half a dose once or twice a week, and has been without it for a week or two. Takes escitalopram for focus and depression, three times a week. Avoiding Xanax due to potential dependency and adverse effects, especially at her age. - Refill lexapro 5 mg for 90 days, send to [facility] - Refill escitalopram as needed - Avoid refilling Xanax unless symptoms become unbearable  Gastroesophageal Reflux Disease (GERD) Takes omeprazole every other day for acid reflux. Advised to continue current regimen to avoid potential side effects of daily use. - Continue omeprazole every other day  Osteopenia Recent bone density test indicates osteopenia. Advised to continue exercise, vitamin D, and calcium supplementation to maintain bone density and reduce fracture risk. - Continue exercise regimen - Continue vitamin D and calcium supplementation  General Health Maintenance Maintaining a healthy diet with more vegetables and less sugar. Consumes a can of Boost daily for protein and takes vitamin E and B12 supplements. Engages in regular physical activity, including walking with a walker and performing leg exercises. - Continue current diet and exercise regimen - Continue vitamin E and B12 supplements - Schedule blood collection at  home to check for anemia, cholesterol, liver and kidney function, thyroid, vitamin B12, A1c, and vitamin D levels  Follow-up - Attend gastroenterology appointment on March 25th - Schedule follow-up visit in three months.       Labs/tests ordered:  * No order type specified * Next appt:  08/12/2023

## 2023-06-09 ENCOUNTER — Other Ambulatory Visit: Payer: Self-pay

## 2023-06-09 ENCOUNTER — Encounter: Payer: Self-pay | Admitting: Gastroenterology

## 2023-06-09 ENCOUNTER — Ambulatory Visit (INDEPENDENT_AMBULATORY_CARE_PROVIDER_SITE_OTHER): Payer: Medicare Other | Admitting: Gastroenterology

## 2023-06-09 VITALS — BP 130/72 | HR 89 | Temp 97.9°F | Ht 61.0 in | Wt 127.4 lb

## 2023-06-09 DIAGNOSIS — K449 Diaphragmatic hernia without obstruction or gangrene: Secondary | ICD-10-CM

## 2023-06-09 DIAGNOSIS — R140 Abdominal distension (gaseous): Secondary | ICD-10-CM

## 2023-06-09 DIAGNOSIS — Z8719 Personal history of other diseases of the digestive system: Secondary | ICD-10-CM | POA: Diagnosis not present

## 2023-06-09 DIAGNOSIS — R1013 Epigastric pain: Secondary | ICD-10-CM | POA: Diagnosis not present

## 2023-06-09 NOTE — Patient Instructions (Addendum)
 I got your CT Entero scan schedule for you on 06/15/2023 arrive to medical mall at [facility] at 2:00pm for a 3:00pm scan. Nothing but liquids 4 hours prior. If you need to reschedule please call [phone] option 3 and then option 2.

## 2023-06-09 NOTE — Progress Notes (Signed)
 Arlyss Repress, MD [address]  [address]  [place], [place] [address]  Main: [phone]  Fax: [phone]    Gastroenterology Consultation  Referring Provider:     Earnestine Mealing, MD Primary Care Physician:  Earnestine Mealing, MD Primary Gastroenterologist:  Dr. Arlyss Repress Reason for Consultation: Upper abdominal bloating        HPI:   Diana Owen is a 89 y.o. female referred by Dr. Earnestine Mealing, MD  for consultation & management of upper abdominal bloating.  Patient has known history of hiatal hernia, esophageal stricture.  She reports history of small bowel blockage and underwent a surgery several years ago.  She reports epigastric discomfort after a meal.  She is able to tolerate liquids well but not solids.  She denies any weight loss.  She reports history of constipation, eating more prunes and taking MiraLAX which is helping move her bowels regularly.  She denies experiencing hard stools anymore.  No known history of anemia, CMP normal.  She does have a history of severe scoliosis.  Uses walker and functionally independent Patient did have features of partial small bowel obstruction based on CT abdomen pelvis has been small bowel series in 2015 and 2016.  She does have history of constipation as well  NSAIDs: None  Antiplts/Anticoagulants/Anti thrombotics: None  GI Procedures: Upper endoscopy in 11/2005 Colonoscopy in 10/2014 Barium swallow in 08/2015 showed small cervical esophageal diverticulum, mild reflux  Past Medical History:  Diagnosis Date   Anxiety    Arthritis    finger!!   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 if patient gets up during meal to let food pass, then n & v is not so bad   Midgut volvulus    SBO (small bowel  obstruction) (HCC) 11/13/2016   Small bowel obstruction (HCC)    Small bowel obstruction due to postoperative adhesions 06/29/2017   Thyroid disease     Past Surgical History:  Procedure Laterality Date   APPENDECTOMY  2002   LAPAROTOMY N/A 09/28/2017   Procedure: EXPLORATORY LAPAROTOMY WITH LYSIS OF ADHESIONS;  Surgeon: Ancil Linsey, MD;  Location: [facility] ORS;  Service: General;  Laterality: N/A;   PARTIAL COLECTOMY  2002   large polyp   TONSILLECTOMY     TUBAL LIGATION       Current Outpatient Medications:    acetaminophen (TYLENOL) 500 MG tablet, Take 500 mg by mouth every 6 (six) hours as needed., Disp: , Rfl:    cholecalciferol (VITAMIN D) 1000 units tablet, Take 2,000 Units by mouth daily., Disp: , Rfl:    Cyanocobalamin (VITAMIN B-12) 6000 MCG SUBL, Place 1 tablet under the tongue daily., Disp: , Rfl:    escitalopram (LEXAPRO) 5 MG tablet, Take 1 tablet (5 mg total) by mouth every other day., Disp: 45 tablet, Rfl: 1   levothyroxine (SYNTHROID) 100 MCG tablet, Take 1 tablet (100 mcg total) by mouth daily before breakfast., Disp: 90 tablet, Rfl: 3   meclizine (ANTIVERT) 25 MG tablet, Take 25 mg by mouth 3 (three) times daily as needed for dizziness., Disp: , Rfl:    Multiple Vitamins-Minerals (MULTIVITAMIN WITH MINERALS) tablet, Take 1 tablet by mouth daily., Disp: , Rfl:    omeprazole (PRILOSEC) 20 MG capsule, Take 40 mg by mouth  daily., Disp: , Rfl:    polyethylene glycol (MIRALAX / GLYCOLAX) packet, Take 17 g by mouth daily., Disp: , Rfl:    PREVIDENT 5000 PLUS 1.1 % CREA dental cream, SMARTSIG:Application By Mouth, Disp: , Rfl:    vitamin E 100 UNIT capsule, Take 100 Units by mouth daily., Disp: , Rfl:    BIOTIN PO, Take by mouth. (Patient not taking: Reported on 06/09/2023), Disp: , Rfl:    simethicone (MYLICON) 80 MG chewable tablet, Chew 80 mg by mouth every 6 (six) hours as needed for flatulence. (Patient not taking: Reported on 06/09/2023), Disp: , Rfl:    Family History   Problem Relation Age of Onset   Leukemia Mother        CLL   Leukemia Father    Hyperlipidemia Brother    Stroke Brother    Heart disease Paternal Uncle    Diabetes Neg Hx      Social History   Tobacco Use   Smoking status: Never    Passive exposure: Past   Smokeless tobacco: Never  Vaping Use   Vaping status: Never Used  Substance Use Topics   Alcohol use: Yes    Comment: rare wine    Drug use: No    Allergies as of 06/09/2023 - Review Complete 06/09/2023  Allergen Reaction Noted   Codeine Nausea And Vomiting 11/12/2016   Adhesive [tape] Rash 09/24/2017   Latex Rash 11/21/2016    Review of Systems:    All systems reviewed and negative except where noted in HPI.   Physical Exam:  BP 130/72 (BP Location: Left Arm, Patient Position: Sitting, Cuff Size: Normal)   Pulse 89   Temp 97.9 F (36.6 C) (Oral)   Ht 5\' 1"  (1.549 m)   Wt 127 lb 6 oz (57.8 kg)   BMI 24.07 kg/m  No LMP recorded. Patient is postmenopausal.  General:   Alert,  Well-developed, well-nourished, pleasant and cooperative in NAD Head:  Normocephalic and atraumatic. Eyes:  Sclera clear, no icterus.   Conjunctiva pink. Ears:  Normal auditory acuity. Nose:  No deformity, discharge, or lesions. Mouth:  No deformity or lesions,oropharynx pink & moist. Neck:  Supple; no masses or thyromegaly. Lungs:  Respirations even and unlabored.  Clear throughout to auscultation.   No wheezes, crackles, or rhonchi. No acute distress. Heart:  Regular rate and rhythm; no murmurs, clicks, rubs, or gallops. Abdomen:  Normal bowel sounds. Soft, non-tender and moderately distended in left upper quadrant without masses, hepatosplenomegaly or hernias noted.  No guarding or rebound tenderness.   Rectal: Not performed Msk: Scoliosis of the spine Pulses:  Normal pulses noted. Extremities:  No clubbing or edema.  No cyanosis. Neurologic:  Alert and oriented x3;  grossly normal neurologically. Skin:  Intact without  significant lesions or rashes. No jaundice. Psych:  Alert and cooperative. Normal mood and affect.  Imaging Studies: Reviewed  Assessment and Plan:   Diana Owen is a 89 y.o. pleasant Caucasian female with past history of small bowel obstruction, secondary to adhesions, exploratory laparotomy with lysis of adhesions, history of partial colectomy in 2002, severe scoliosis, known hiatal hernia is seen in consultation for epigastric discomfort  Recommend CT enterography Recommend EGD for further evaluation of hiatal hernia   Follow up based on the above workup   Arlyss Repress, MD

## 2023-06-15 ENCOUNTER — Ambulatory Visit
Admission: RE | Admit: 2023-06-15 | Discharge: 2023-06-15 | Disposition: A | Discharge status: Home / Self Care | Source: Ambulatory Visit | Attending: Gastroenterology | Admitting: Gastroenterology

## 2023-06-15 DIAGNOSIS — K56609 Unspecified intestinal obstruction, unspecified as to partial versus complete obstruction: Secondary | ICD-10-CM | POA: Diagnosis not present

## 2023-06-15 DIAGNOSIS — K7290 Hepatic failure, unspecified without coma: Secondary | ICD-10-CM | POA: Diagnosis not present

## 2023-06-15 DIAGNOSIS — Z8719 Personal history of other diseases of the digestive system: Secondary | ICD-10-CM | POA: Insufficient documentation

## 2023-06-15 DIAGNOSIS — R140 Abdominal distension (gaseous): Secondary | ICD-10-CM | POA: Insufficient documentation

## 2023-06-15 MED ORDER — IOHEXOL 300 MG/ML  SOLN
100.0000 mL | Freq: Once | INTRAMUSCULAR | Status: AC | PRN
  Administered 2023-06-15: 100 mL via INTRAVENOUS

## 2023-06-17 ENCOUNTER — Encounter: Payer: Self-pay | Admitting: Gastroenterology

## 2023-06-17 NOTE — Anesthesia Preprocedure Evaluation (Signed)
 Anesthesia Evaluation    Airway        Dental   Pulmonary           Cardiovascular      Neuro/Psych    GI/Hepatic   Endo/Other    Renal/GU      Musculoskeletal   Abdominal   Peds  Hematology   Anesthesia Other Findings   thyroid disease  GERD (gastroesophageal reflux disease) Intractable vomiting with nausea Midgut volvulus Small bowel obstruction (HCC)  SBO (small bowel obstruction) (HCC) Hypothyroidism  Anxiety History of hiatal hernia  Barrett's esophagus Arthritis  Intestinal adhesions with complete obstruction (HCC) Small bowel obstruction due to postoperative adhesions      Reproductive/Obstetrics                              Anesthesia Physical Anesthesia Plan Anesthesia Quick Evaluation

## 2023-06-22 ENCOUNTER — Telehealth: Payer: Self-pay

## 2023-06-22 NOTE — Telephone Encounter (Signed)
-----   Message from [facility] sent at 06/22/2023 11:09 AM EDT ----- Please inform patient that the CT scan did not reveal any evidence of small bowel obstruction.  She does have moderate amount of stool throughout her left colon.  Recommend to continue taking MiraLAX 1 capful in large cup of water daily, if needed she can increase to 1 and half capfuls with large cup of water daily  RV

## 2023-06-22 NOTE — Telephone Encounter (Signed)
 Patient left a message on the main line and return patient call and patient verbalized understanding of results

## 2023-06-22 NOTE — Telephone Encounter (Signed)
 Called patient and left a message for call back. Ca

## 2023-06-29 ENCOUNTER — Telehealth: Payer: Self-pay | Admitting: Gastroenterology

## 2023-06-29 NOTE — Telephone Encounter (Signed)
 Called Endo and talk to Natalie and she will get patient cancel for 06/30/2023

## 2023-06-29 NOTE — Telephone Encounter (Signed)
 Pt has EGD scheduled  with Dr. Baldomero Bone for 06/30/2023 called to cancel because not feeling well will call back to reschedule

## 2023-06-30 ENCOUNTER — Encounter: Payer: Self-pay | Admitting: Anesthesiology

## 2023-06-30 ENCOUNTER — Ambulatory Visit: Admission: RE | Admit: 2023-06-30 | Source: Ambulatory Visit | Admitting: Gastroenterology

## 2023-06-30 SURGERY — EGD (ESOPHAGOGASTRODUODENOSCOPY)
Anesthesia: General

## 2023-07-02 ENCOUNTER — Ambulatory Visit: Admitting: Nurse Practitioner

## 2023-07-02 ENCOUNTER — Encounter: Payer: Self-pay | Admitting: Nurse Practitioner

## 2023-07-02 VITALS — BP 128/66 | HR 74 | Temp 97.9°F | Ht 61.0 in | Wt 122.0 lb

## 2023-07-02 DIAGNOSIS — J301 Allergic rhinitis due to pollen: Secondary | ICD-10-CM

## 2023-07-02 NOTE — Progress Notes (Signed)
 Careteam: Patient Care Team: Earnestine Mealing, MD as PCP - General (Family Medicine) Kathyrn Sheriff, [facility] (Inactive) as Pharmacist (Pharmacist) PLACE OF SERVICE:  [facility]   Advanced Directive information    Allergies  Allergen Reactions   Codeine Nausea And Vomiting   Adhesive [Tape] Rash    Paper tape is okay   Latex Rash    Probably adhesive allergy.  bandaids bother patient    Chief Complaint  Patient presents with   Sinusitis    Sinus Infection.    Discussed the use of AI scribe software for clinical note transcription with the patient, who gave verbal consent to proceed.  History of Present Illness   Diana Owen is an 89 year old female who presents with sinus congestion and related symptoms.  Her symptoms began after planting a tree last Tuesday night. The following day, she experienced significant sinus congestion, which has persisted with fluctuating intensity. This morning, she had a sneeze with a tiny bit of dark brown discharge with a little blood, followed by yellow discharge. She has achiness in the sinus area and some trouble with her left ear. No sore throat, chest congestion, cough, or significant fever, although she has felt clammy and heated at times.  For symptom relief, she has taken [facility] sinus medicine, both night and day formulations, one aspirin, and one Tylenol. She also used a hot saltwater rinse, which she found effective. She has not been using any allergy medications like Claritin or Zyrtec.  She had COVID a year ago, which resulted in persistent drainage that was not present before the infection. She has not had significant issues with allergies in the past, but notes that the pollen this year is particularly bad.       Review of Systems:  Review of Systems  Constitutional:  Positive for malaise/fatigue. Negative for chills, fever and weight loss.  HENT:  Positive for congestion. Negative for ear discharge, ear pain, sore  throat and tinnitus.   Respiratory:  Negative for cough, sputum production and shortness of breath.   Cardiovascular:  Negative for chest pain, palpitations and leg swelling.  Gastrointestinal:  Negative for abdominal pain, constipation, diarrhea and heartburn.  Genitourinary:  Negative for dysuria, frequency and urgency.  Skin: Negative.   Neurological:  Negative for dizziness and headaches.  Endo/Heme/Allergies:  Positive for environmental allergies.  Psychiatric/Behavioral:  Negative for depression and memory loss. The patient does not have insomnia.     Past Medical History:  Diagnosis Date   Anxiety    Arthritis    finger!!   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 if patient gets up during meal to let food pass, then n & v is not so bad   Midgut volvulus    SBO (small bowel obstruction) (HCC) 11/13/2016   Small bowel obstruction (HCC)    Small bowel obstruction due to postoperative adhesions 06/29/2017   Thyroid disease    Past Surgical History:  Procedure Laterality Date   APPENDECTOMY  2002   LAPAROTOMY N/A 09/28/2017   Procedure: EXPLORATORY LAPAROTOMY WITH LYSIS OF ADHESIONS;  Surgeon: Ancil Linsey, MD;  Location: [facility] ORS;  Service: General;  Laterality: N/A;   PARTIAL COLECTOMY  2002   large polyp   TONSILLECTOMY  TUBAL LIGATION     Social History:   reports that she has never smoked. She has been exposed to tobacco smoke. She has never used smokeless tobacco. She reports current alcohol use of about 7.0 standard drinks of alcohol per week. She reports that she does not use drugs.  Family History  Problem Relation Age of Onset   Leukemia Mother        CLL   Leukemia Father    Hyperlipidemia Brother    Stroke Brother    Heart disease Paternal  Uncle    Diabetes Neg Hx     Medications: Patient's Medications  New Prescriptions   No medications on file  Previous Medications   ACETAMINOPHEN (TYLENOL) 500 MG TABLET    Take 500 mg by mouth every 6 (six) hours as needed.   BIOTIN PO    Take by mouth.   CHOLECALCIFEROL (VITAMIN D) 1000 UNITS TABLET    Take 2,000 Units by mouth daily.   CYANOCOBALAMIN (VITAMIN B-12) 6000 MCG SUBL    Place 1 tablet under the tongue daily.   ESCITALOPRAM (LEXAPRO) 5 MG TABLET    Take 1 tablet (5 mg total) by mouth every other day.   LEVOTHYROXINE (SYNTHROID) 100 MCG TABLET    Take 1 tablet (100 mcg total) by mouth daily before breakfast.   MECLIZINE (ANTIVERT) 25 MG TABLET    Take 25 mg by mouth 3 (three) times daily as needed for dizziness.   MULTIPLE VITAMINS-MINERALS (MULTIVITAMIN WITH MINERALS) TABLET    Take 1 tablet by mouth daily.   OMEPRAZOLE (PRILOSEC) 20 MG CAPSULE    Take 40 mg by mouth daily.   POLYETHYLENE GLYCOL (MIRALAX / GLYCOLAX) PACKET    Take 17 g by mouth daily.   PREVIDENT 5000 PLUS 1.1 % CREA DENTAL CREAM    SMARTSIG:Application By Mouth   SIMETHICONE (MYLICON) 80 MG CHEWABLE TABLET    Chew 80 mg by mouth every 6 (six) hours as needed for flatulence.   VITAMIN E 100 UNIT CAPSULE    Take 100 Units by mouth daily.  Modified Medications   No medications on file  Discontinued Medications   No medications on file    Physical Exam:  Vitals:   07/02/23 0918  BP: 128/66  Pulse: 74  Temp: 97.9 F (36.6 C)  SpO2: 98%  Weight: 122 lb (55.3 kg)  Height: 5\' 1"  (1.549 m)   Body mass index is 23.05 kg/m. Wt Readings from Last 3 Encounters:  07/02/23 122 lb (55.3 kg)  06/09/23 127 lb 6 oz (57.8 kg)  05/15/23 127 lb (57.6 kg)    Physical Exam Constitutional:      General: She is not in acute distress.    Appearance: She is well-developed. She is not diaphoretic.  HENT:     Head: Normocephalic and atraumatic.     Left Ear: A middle ear effusion is present.     Mouth/Throat:      Pharynx: No oropharyngeal exudate.  Eyes:     Conjunctiva/sclera: Conjunctivae normal.     Pupils: Pupils are equal, round, and reactive to light.  Cardiovascular:     Rate and Rhythm: Normal rate and regular rhythm.     Heart sounds: Normal heart sounds.  Pulmonary:     Effort: Pulmonary effort is normal.     Breath sounds: Normal breath sounds.  Abdominal:     General: Bowel sounds are normal.     Palpations: Abdomen is soft.  Musculoskeletal:  Cervical back: Normal range of motion and neck supple.     Right lower leg: No edema.     Left lower leg: No edema.  Skin:    General: Skin is warm and dry.  Neurological:     Mental Status: She is alert.  Psychiatric:        Mood and Affect: Mood normal.     Labs reviewed: Basic Metabolic Panel: No results for input(s): "NA", "K", "CL", "CO2", "GLUCOSE", "BUN", "CREATININE", "CALCIUM", "MG", "PHOS", "TSH" in the last 8760 hours. Liver Function Tests: No results for input(s): "AST", "ALT", "ALKPHOS", "BILITOT", "PROT", "ALBUMIN" in the last 8760 hours. No results for input(s): "LIPASE", "AMYLASE" in the last 8760 hours. No results for input(s): "AMMONIA" in the last 8760 hours. CBC: No results for input(s): "WBC", "NEUTROABS", "HGB", "HCT", "MCV", "PLT" in the last 8760 hours. Lipid Panel: No results for input(s): "CHOL", "HDL", "LDLCALC", "TRIG", "CHOLHDL", "LDLDIRECT" in the last 8760 hours. TSH: No results for input(s): "TSH" in the last 8760 hours. A1C: No results found for: "HGBA1C"   Assessment/Plan Assessment and Plan    Allergic Rhinitis Symptoms of nasal congestion, sneezing, and post-nasal drip likely due to pollen exposure. Fluid behind left ear due to sinus pressure. Symptoms are allergy-related, not contagious. - Advise saline nasal rinse 1-2 times daily. - Recommend Flonase nasal spray, one spray in each nostril twice daily if needed. - daily oral antihistamine such as Claritin or Zyrtec. - Educate on  avoiding excessive nose blowing to prevent rebound congestion.    To call if symptoms worsen or fail to improve Diana Owen K. Denney Fisherman  [facility] [phone]

## 2023-07-02 NOTE — Patient Instructions (Addendum)
 Nettipot or saline rinse 1-2 times  Flonase 1 spray into both nares twice daily as needed for nasal congestion  No do blow nose excessively or hard  To take Loratadine or cetrizine (generic for Claritin or zyrtec) 10 mg by mouth daily for allergies

## 2023-07-21 ENCOUNTER — Ambulatory Visit: Admitting: Nurse Practitioner

## 2023-07-28 ENCOUNTER — Other Ambulatory Visit: Payer: Self-pay | Admitting: Internal Medicine

## 2023-07-28 DIAGNOSIS — E039 Hypothyroidism, unspecified: Secondary | ICD-10-CM

## 2023-07-29 ENCOUNTER — Other Ambulatory Visit: Payer: Self-pay | Admitting: *Deleted

## 2023-07-29 DIAGNOSIS — E039 Hypothyroidism, unspecified: Secondary | ICD-10-CM

## 2023-07-29 MED ORDER — LEVOTHYROXINE SODIUM 100 MCG PO TABS: 100.0000 ug | ORAL_TABLET | Freq: Every day | ORAL | 1 refills | Status: DC

## 2023-07-29 NOTE — Telephone Encounter (Signed)
 Patient requested refill at [facility].

## 2023-08-11 ENCOUNTER — Ambulatory Visit: Payer: Self-pay | Admitting: Student

## 2023-08-11 DIAGNOSIS — F39 Unspecified mood [affective] disorder: Secondary | ICD-10-CM | POA: Diagnosis not present

## 2023-08-11 DIAGNOSIS — E039 Hypothyroidism, unspecified: Secondary | ICD-10-CM | POA: Diagnosis not present

## 2023-08-11 DIAGNOSIS — F0390 Unspecified dementia without behavioral disturbance: Secondary | ICD-10-CM | POA: Diagnosis not present

## 2023-08-11 DIAGNOSIS — M8589 Other specified disorders of bone density and structure, multiple sites: Secondary | ICD-10-CM | POA: Diagnosis not present

## 2023-08-11 DIAGNOSIS — E785 Hyperlipidemia, unspecified: Secondary | ICD-10-CM | POA: Diagnosis not present

## 2023-08-11 DIAGNOSIS — R739 Hyperglycemia, unspecified: Secondary | ICD-10-CM | POA: Diagnosis not present

## 2023-08-12 ENCOUNTER — Ambulatory Visit: Payer: Medicare HMO | Admitting: Student

## 2023-08-12 ENCOUNTER — Encounter: Payer: Self-pay | Admitting: Student

## 2023-08-12 VITALS — BP 124/76 | HR 69 | Temp 97.2°F | Ht 61.0 in | Wt 123.0 lb

## 2023-08-12 DIAGNOSIS — M5135 Other intervertebral disc degeneration, thoracolumbar region: Secondary | ICD-10-CM | POA: Diagnosis not present

## 2023-08-12 DIAGNOSIS — E7800 Pure hypercholesterolemia, unspecified: Secondary | ICD-10-CM

## 2023-08-12 DIAGNOSIS — M4135 Thoracogenic scoliosis, thoracolumbar region: Secondary | ICD-10-CM

## 2023-08-12 NOTE — Patient Instructions (Signed)
 VISIT SUMMARY:  During today's visit, we discussed your recent allergic reaction to toothpaste, your desire to avoid an endoscopy, and reviewed your overall health, including your cholesterol levels and spine condition.  YOUR PLAN:  -LEVORODOSCOLIOSIS WITH MULTILEVEL DEGENERATIVE DISC DISEASE: This condition involves a curvature of the spine and degeneration of the spinal discs, which can cause discomfort and potentially affect bowel movements. Since your recent CT scan showed no bowel obstruction, we will avoid invasive procedures unless your symptoms worsen.  -HYPERCHOLESTEROLEMIA: This means you have elevated cholesterol levels, specifically an LDL of 149. To help manage this, you should reduce your intake of brownies and wine, start a daily regimen of 1 tablespoon of apple cider vinegar in 8 ounces of water, and increase your exercise.  -ALLERGY TO MENTHOL : You had an allergic reaction to menthol  in your toothpaste, which caused a rash in your mouth. To prevent future reactions, avoid products containing menthol  and ensure menthol  is listed in your allergy information.  INSTRUCTIONS:  Please follow the dietary and exercise recommendations to help manage your cholesterol levels. Avoid menthol -containing products to prevent allergic reactions. No invasive procedures are planned for your spine condition unless symptoms worsen. Continue with your homemade remedy for constipation as needed.

## 2023-08-12 NOTE — Progress Notes (Signed)
 Location:  [facility] POS: [facility] Provider: Jann Melody  Code Status: DNR Goals of Care:     08/12/2023    2:40 PM  Advanced Directives  Does Patient Have a Medical Advance Directive? Yes  Type of Estate agent of [place];Out of facility DNR (pink MOST or yellow form);Living will  Does patient want to make changes to medical advance directive? No - Patient declined  Copy of Healthcare Power of Attorney in Chart? No - copy requested     Chief Complaint  Patient presents with   Medical Management of Chronic Issues    Medical Management of Chronic Issues. 3 Month follow up with Labs.     HPI: Patient is a 89 y.o. female seen today for medical management of chronic diseases.   Discussed the use of AI scribe software for clinical note transcription with the patient, who gave verbal consent to proceed.  History of Present Illness   Diana Owen is an 89 year old female who presents with concerns about a recent allergic reaction to toothpaste and a request to avoid an endoscopy.  She experienced an allergic reaction to toothpaste, Prevident 5000 resulting in a rash in her mouth. The reaction worsened with continued use but resolved three days after discontinuing the toothpaste and using saltwater rinses. She suspects a menthol  allergy as the cause.  She wants to avoid undergoing an endoscopy, which was previously scheduled but canceled due to a severe cold. She has difficulty swallowing large pieces of food, such as steak, but manages well otherwise. A CT scan in April showed no evidence of bowel obstruction, but a moderate amount of stool was noted. She manages constipation with a homemade remedy consisting of applesauce, prune juice, and bran, which she finds effective.  She has a history of levorodoscoliosis with multilevel degenerative disc disease, causing a noticeable curvature in her spine.  Recent lab results show normal kidney and liver function,  normal vitamin D levels, and high vitamin B12 levels, which she has been supplementing since the 1970s. Her cholesterol is slightly elevated, with an LDL of 149. She recalls taking cholesterol medication about 50 years ago. She maintains a generally healthy diet, focusing on vegetables and limiting sweets, but enjoys brownies and wine regularly.  She enjoys gardening and maintains a variety of plants, including cacti, ferns, begonias, and cherry tomatoes. She considers gardening a form of exercise but has reduced her physical activity recently.         Past Medical History:  Diagnosis Date   Anxiety    Arthritis    finger!!   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 if patient gets up during meal to let food pass, then n & v is not so bad   Midgut volvulus    SBO (small bowel obstruction) (HCC) 11/13/2016   Small bowel obstruction (HCC)    Small bowel obstruction due to postoperative adhesions 06/29/2017   Thyroid  disease     Past Surgical History:  Procedure Laterality Date   APPENDECTOMY  2002   LAPAROTOMY N/A 09/28/2017   Procedure: EXPLORATORY LAPAROTOMY WITH LYSIS OF ADHESIONS;  Surgeon: Franki Isles, MD;  Location: [facility] ORS;  Service: General;  Laterality: N/A;   PARTIAL COLECTOMY  2002   large polyp  TONSILLECTOMY     TUBAL LIGATION      Allergies  Allergen Reactions   Codeine Nausea And Vomiting   Prevident 5000 Enamel Protect [Sod Fluoride-Potassium Nitrate]     Mouth ulcer developed   Adhesive [Tape] Rash    Paper tape is okay   Latex Rash    Probably adhesive allergy.  bandaids bother patient    Outpatient Encounter Medications as of 08/12/2023  Medication Sig   acetaminophen  (TYLENOL ) 500 MG tablet Take 500 mg by mouth every 6 (six) hours as  needed.   BIOTIN PO Take by mouth.   cholecalciferol (VITAMIN D ) 1000 units tablet Take 2,000 Units by mouth daily.   Cyanocobalamin (VITAMIN B-12) 6000 MCG SUBL Place 1 tablet under the tongue daily.   escitalopram  (LEXAPRO ) 5 MG tablet Take 1 tablet (5 mg total) by mouth every other day.   levothyroxine  (SYNTHROID ) 100 MCG tablet Take 1 tablet (100 mcg total) by mouth daily before breakfast.   meclizine  (ANTIVERT ) 25 MG tablet Take 25 mg by mouth 3 (three) times daily as needed for dizziness.   Multiple Vitamins-Minerals (MULTIVITAMIN WITH MINERALS) tablet Take 1 tablet by mouth daily.   omeprazole  (PRILOSEC) 20 MG capsule Take 40 mg by mouth daily.   polyethylene glycol (MIRALAX / GLYCOLAX) packet Take 17 g by mouth daily.   simethicone (MYLICON) 80 MG chewable tablet Chew 80 mg by mouth every 6 (six) hours as needed for flatulence.   vitamin E 100 UNIT capsule Take 100 Units by mouth daily.   [DISCONTINUED] PREVIDENT 5000 PLUS 1.1 % CREA dental cream SMARTSIG:Application By Mouth   No facility-administered encounter medications on file as of 08/12/2023.    Review of Systems:  Review of Systems  Health Maintenance  Topic Date Due   INFLUENZA VACCINE  10/16/2023   COVID-19 Vaccine (9 - 2024-25 season) 01/20/2024   Medicare Annual Wellness (AWV)  03/05/2024   DTaP/Tdap/Td (3 - Td or Tdap) 03/30/2033   Pneumonia Vaccine 51+ Years old  Completed   DEXA SCAN  Completed   Zoster Vaccines- Shingrix  Completed   HPV VACCINES  Aged Out   Meningococcal B Vaccine  Aged Out    Physical Exam: Vitals:   08/12/23 1437  BP: 124/76  Pulse: 69  Temp: (!) 97.2 F (36.2 C)  SpO2: 95%  Weight: 123 lb (55.8 kg)  Height: 5\' 1"  (1.549 m)   Body mass index is 23.24 kg/m. Physical Exam Constitutional:      Appearance: Normal appearance.  Cardiovascular:     Rate and Rhythm: Normal rate and regular rhythm.     Pulses: Normal pulses.     Heart sounds: Normal heart sounds.  Pulmonary:      Effort: Pulmonary effort is normal.  Abdominal:     General: Abdomen is flat. Bowel sounds are normal.     Palpations: Abdomen is soft.  Musculoskeletal:        General: No swelling or tenderness.  Skin:    General: Skin is warm and dry.  Neurological:     Mental Status: She is alert and oriented to person, place, and time.  Psychiatric:        Mood and Affect: Mood normal.    Labs reviewed: Basic Metabolic Panel: Recent Labs    08/11/23 0814  NA 141  K 4.1  CL 105  CO2 28  GLUCOSE 87  BUN 20  CREATININE 0.64  CALCIUM  9.2  TSH 0.42   Liver Function Tests: Recent Labs  08/11/23 0814  AST 19  ALT 16  BILITOT 0.7  PROT 6.3   No results for input(s): "LIPASE", "AMYLASE" in the last 8760 hours. No results for input(s): "AMMONIA" in the last 8760 hours. CBC: Recent Labs    08/11/23 0814  WBC 6.6  NEUTROABS 4,448  HGB 13.7  HCT 41.6  MCV 91.0  PLT 189   Lipid Panel: Recent Labs    08/11/23 0814  CHOL 238*  HDL 70  LDLCALC 149*  TRIG 89  CHOLHDL 3.4   Lab Results  Component Value Date   HGBA1C 5.9 (H) 08/11/2023    Procedures since last visit: No results found. 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 Recent CT scan shows no bowel obstruction. She prefers to avoid invasive procedures due to concerns about anesthesia and  age-related risks. - No invasive procedures unless symptoms worsen.  Hypercholesterolemia Elevated cholesterol levels with LDL at 149. No current recommendations for cholesterol medication due to age. Dietary influences, including high intake of brownies and wine, may contribute to elevated cholesterol. Advised to consider dietary modifications and increase exercise. - Reduce intake of brownies and wine. - Introduce apple cider vinegar regimen: 1 tablespoon in 8 ounces of water daily. - Encourage increased exercise.  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 Symptoms resolved after discontinuation of the toothpaste and use of salt water rinses. - Avoid menthol -containing products. - Ensure menthol  is included in allergy list.       Labs/tests ordered:  * No order type specified * Next appt:  11/11/2023

## 2023-08-13 ENCOUNTER — Telehealth: Payer: Self-pay | Admitting: *Deleted

## 2023-08-13 MED ORDER — ATORVASTATIN CALCIUM 40 MG PO TABS: 40.0000 mg | ORAL_TABLET | Freq: Every day | ORAL | 3 refills | Status: AC

## 2023-08-13 NOTE — Telephone Encounter (Signed)
 Patient called and wanted to know if she could start taking Lipitor instead of giving up her ice cream.  Stated that she has taken it before in the past.   Please Advise.

## 2023-08-13 NOTE — Telephone Encounter (Signed)
 Okay, Will send her the medication

## 2023-08-13 NOTE — Addendum Note (Signed)
 Addended by: Tyerra Loretto on: 08/13/2023 09:55 PM   Modules accepted: Orders

## 2023-08-14 NOTE — Telephone Encounter (Signed)
 Patient notified and agreed.

## 2023-08-17 DIAGNOSIS — L820 Inflamed seborrheic keratosis: Secondary | ICD-10-CM | POA: Diagnosis not present

## 2023-08-17 DIAGNOSIS — L814 Other melanin hyperpigmentation: Secondary | ICD-10-CM | POA: Diagnosis not present

## 2023-08-17 DIAGNOSIS — L821 Other seborrheic keratosis: Secondary | ICD-10-CM | POA: Diagnosis not present

## 2023-09-23 ENCOUNTER — Other Ambulatory Visit: Payer: Self-pay | Admitting: Student

## 2023-09-23 NOTE — Telephone Encounter (Signed)
 Patient has request refill on medication Xanax . Medication has been discontinue by PCP Abdul Fine, MD 05/15/2023. Medication pend and sent to PCP for approval/denial.

## 2023-10-06 ENCOUNTER — Ambulatory Visit: Admitting: Nurse Practitioner

## 2023-10-06 ENCOUNTER — Encounter: Payer: Self-pay | Admitting: Nurse Practitioner

## 2023-10-06 ENCOUNTER — Ambulatory Visit
Admission: RE | Admit: 2023-10-06 | Discharge: 2023-10-06 | Disposition: A | Discharge status: Home / Self Care | Attending: Nurse Practitioner | Admitting: Nurse Practitioner

## 2023-10-06 ENCOUNTER — Ambulatory Visit
Admission: RE | Admit: 2023-10-06 | Discharge: 2023-10-06 | Disposition: A | Discharge status: Home / Self Care | Source: Ambulatory Visit | Attending: Nurse Practitioner | Admitting: Nurse Practitioner

## 2023-10-06 VITALS — BP 128/78 | HR 76 | Temp 98.1°F | Ht 61.0 in | Wt 125.8 lb

## 2023-10-06 DIAGNOSIS — R042 Hemoptysis: Secondary | ICD-10-CM

## 2023-10-06 DIAGNOSIS — I700 Atherosclerosis of aorta: Secondary | ICD-10-CM | POA: Diagnosis not present

## 2023-10-06 DIAGNOSIS — J189 Pneumonia, unspecified organism: Secondary | ICD-10-CM | POA: Diagnosis not present

## 2023-10-06 DIAGNOSIS — R059 Cough, unspecified: Secondary | ICD-10-CM | POA: Diagnosis not present

## 2023-10-06 DIAGNOSIS — R918 Other nonspecific abnormal finding of lung field: Secondary | ICD-10-CM | POA: Diagnosis not present

## 2023-10-06 DIAGNOSIS — M40204 Unspecified kyphosis, thoracic region: Secondary | ICD-10-CM | POA: Diagnosis not present

## 2023-10-06 LAB — CBC WITH DIFFERENTIAL/PLATELET
Absolute Lymphocytes: 1397 {cells}/uL (ref 850–3900)
Absolute Monocytes: 1121 {cells}/uL — ABNORMAL HIGH (ref 200–950)
Basophils Absolute: 29 {cells}/uL (ref 0–200)
Basophils Relative: 0.3 %
Eosinophils Absolute: 200 {cells}/uL (ref 15–500)
Eosinophils Relative: 2.1 %
HCT: 41.8 % (ref 35.0–45.0)
Hemoglobin: 13.8 g/dL (ref 11.7–15.5)
MCH: 30.3 pg (ref 27.0–33.0)
MCHC: 33 g/dL (ref 32.0–36.0)
MCV: 91.7 fL (ref 80.0–100.0)
MPV: 10.4 fL (ref 7.5–12.5)
Monocytes Relative: 11.8 %
Neutro Abs: 6755 {cells}/uL (ref 1500–7800)
Neutrophils Relative %: 71.1 %
Platelets: 242 Thousand/uL (ref 140–400)
RBC: 4.56 Million/uL (ref 3.80–5.10)
RDW: 11.7 % (ref 11.0–15.0)
Total Lymphocyte: 14.7 %
WBC: 9.5 Thousand/uL (ref 3.8–10.8)

## 2023-10-06 LAB — D-DIMER, QUANTITATIVE: D-Dimer, Quant: 0.65 ug{FEU}/mL — ABNORMAL HIGH (ref <0.50)

## 2023-10-06 MED ORDER — AMOXICILLIN-POT CLAVULANATE 875-125 MG PO TABS: 1.0000 | ORAL_TABLET | Freq: Two times a day (BID) | ORAL | 0 refills | Status: DC

## 2023-10-06 MED ORDER — AZITHROMYCIN 250 MG PO TABS: ORAL_TABLET | ORAL | 0 refills | Status: AC

## 2023-10-06 NOTE — Progress Notes (Signed)
 Careteam: Patient Care Team: Abdul Fine, MD as PCP - General (Family Medicine) Fate Morna SAILOR, [facility] (Inactive) as Pharmacist (Pharmacist) PLACE OF SERVICE:  [facility]   Advanced Directive information    Allergies  Allergen Reactions   Codeine Nausea And Vomiting   Prevident 5000 Enamel Protect [Sod Fluoride-Potassium Nitrate]     Mouth ulcer developed   Adhesive [Tape] Rash    Paper tape is okay   Latex Rash    Probably adhesive allergy.  bandaids bother patient    Chief Complaint  Patient presents with   Cough    Cough since Sunday with Right sided pain and brown sputum.  Constipated over the weekend.      HPI: Patient is a 89 y.o. female seen in today for cough.  Discussed the use of AI scribe software for clinical note transcription with the patient, who gave verbal consent to proceed.  History of Present Illness Diana Owen is an 89 year old female who presents with a three-day history of cough and hemoptysis.  She has been experiencing a severe cough for the past three days, which began on Sunday, accompanied by the expectoration of bloody sputum. She has a history of sinus drainage since recovering from COVID-19 over a year ago, but there has been no recent sinus soreness.  She describes right-sided pain associated with the cough and reports a history of scoliosis. The pain starts in the back and radiates to the front, feeling as if 'that bone was hitting this bone.' Relief is found by holding the area tightly during coughing fits. Currently, the pain is less severe but still present during coughing. Due to her curvature her ribs sit above right hip  No shortness of breath, fever, or recent exposure to COVID-19. She felt unwell on Sunday, spending the day on the couch under a blanket, but has felt better since then. Coughed up blood on this day but since has not.   She is not on any blood thinners and has not noticed any other bleeding, such as  in her stools, urine, or vaginally. She is temporarily caring for her daughter's cat but does not believe it is related to her symptoms.   Review of Systems:  Review of Systems  Constitutional:  Negative for chills, fever and weight loss.  HENT:  Negative for tinnitus.   Respiratory:  Positive for cough and hemoptysis. Negative for sputum production and shortness of breath.   Cardiovascular:  Negative for chest pain, palpitations and leg swelling.  Gastrointestinal:  Negative for abdominal pain, constipation, diarrhea and heartburn.  Genitourinary:  Negative for dysuria, frequency and urgency.  Musculoskeletal:  Negative for back pain, falls, joint pain and myalgias.  Skin: Negative.   Neurological:  Negative for dizziness and headaches.  Psychiatric/Behavioral:  Negative for depression and memory loss. The patient does not have insomnia.     Past Medical History:  Diagnosis Date   Anxiety    Arthritis    finger!!   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 if patient gets up during meal to let food pass, then n & v is not so bad   Midgut volvulus    SBO (small bowel obstruction) (HCC) 11/13/2016   Small bowel obstruction (HCC)    Small  bowel obstruction due to postoperative adhesions 06/29/2017   Thyroid  disease    Past Surgical History:  Procedure Laterality Date   APPENDECTOMY  2002   LAPAROTOMY N/A 09/28/2017   Procedure: EXPLORATORY LAPAROTOMY WITH LYSIS OF ADHESIONS;  Surgeon: Nicholaus Selinda Birmingham, MD;  Location: [facility] ORS;  Service: General;  Laterality: N/A;   PARTIAL COLECTOMY  2002   large polyp   TONSILLECTOMY     TUBAL LIGATION     Social History:   reports that she has never smoked. She has been exposed to tobacco smoke. She has never used smokeless tobacco.  She reports current alcohol use of about 7.0 standard drinks of alcohol per week. She reports that she does not use drugs.  Family History  Problem Relation Age of Onset   Leukemia Mother        CLL   Leukemia Father    Hyperlipidemia Brother    Stroke Brother    Heart disease Paternal Uncle    Diabetes Neg Hx     Medications: Patient's Medications  New Prescriptions   No medications on file  Previous Medications   ACETAMINOPHEN  (TYLENOL ) 500 MG TABLET    Take 500 mg by mouth every 6 (six) hours as needed.   ALPRAZOLAM  (XANAX ) 0.25 MG TABLET    Take 0.5 tablets (0.125 mg total) by mouth at bedtime as needed for anxiety. TAKE 1/2 TABLET (0.125) BY MOUTH DAILY AS NEEDED FOR ANXIETY   ATORVASTATIN  (LIPITOR) 40 MG TABLET    Take 1 tablet (40 mg total) by mouth daily.   BIOTIN PO    Take by mouth.   CHOLECALCIFEROL (VITAMIN D ) 1000 UNITS TABLET    Take 2,000 Units by mouth daily.   CYANOCOBALAMIN  (VITAMIN B-12) 6000 MCG SUBL    Place 1 tablet under the tongue daily.   ESCITALOPRAM  (LEXAPRO ) 5 MG TABLET    Take 1 tablet (5 mg total) by mouth every other day.   LEVOTHYROXINE  (SYNTHROID ) 100 MCG TABLET    Take 1 tablet (100 mcg total) by mouth daily before breakfast.   MECLIZINE  (ANTIVERT ) 25 MG TABLET    Take 25 mg by mouth 3 (three) times daily as needed for dizziness.   MULTIPLE VITAMINS-MINERALS (MULTIVITAMIN WITH MINERALS) TABLET    Take 1 tablet by mouth daily.   OMEPRAZOLE  (PRILOSEC) 20 MG CAPSULE    Take 40 mg by mouth daily.   POLYETHYLENE GLYCOL (MIRALAX / GLYCOLAX) PACKET    Take 17 g by mouth daily.   SIMETHICONE (MYLICON) 80 MG CHEWABLE TABLET    Chew 80 mg by mouth every 6 (six) hours as needed for flatulence.   VITAMIN E 100 UNIT CAPSULE    Take 100 Units by mouth daily.  Modified Medications   No medications on file  Discontinued Medications   No medications on file    Physical Exam:  Vitals:   10/06/23 0956  BP: 128/78  Pulse: 76  Temp: 98.1 F (36.7 C)  SpO2:  97%  Weight: 125 lb 12.8 oz (57.1 kg)  Height: 5' 1 (1.549 m)   Body mass index is 23.77 kg/m. Wt Readings from Last 3 Encounters:  10/06/23 125 lb 12.8 oz (57.1 kg)  08/12/23 123 lb (55.8 kg)  07/02/23 122 lb (55.3 kg)    Physical Exam Constitutional:      General: She is not in acute distress.    Appearance: She is well-developed. She is not diaphoretic.  HENT:     Head: Normocephalic and atraumatic.  Mouth/Throat:     Pharynx: No oropharyngeal exudate.  Eyes:     Conjunctiva/sclera: Conjunctivae normal.     Pupils: Pupils are equal, round, and reactive to light.  Cardiovascular:     Rate and Rhythm: Normal rate and regular rhythm.     Heart sounds: Normal heart sounds.  Pulmonary:     Effort: Pulmonary effort is normal.     Breath sounds: Normal breath sounds.  Abdominal:     General: Bowel sounds are normal.     Palpations: Abdomen is soft.  Musculoskeletal:     Cervical back: Normal range of motion and neck supple.     Right lower leg: No edema.     Left lower leg: No edema.  Skin:    General: Skin is warm and dry.  Neurological:     Mental Status: She is alert.  Psychiatric:        Mood and Affect: Mood normal.     Labs reviewed: Basic Metabolic Panel: Recent Labs    08/11/23 0814  NA 141  K 4.1  CL 105  CO2 28  GLUCOSE 87  BUN 20  CREATININE 0.64  CALCIUM  9.2  TSH 0.42   Liver Function Tests: Recent Labs    08/11/23 0814  AST 19  ALT 16  BILITOT 0.7  PROT 6.3   No results for input(s): LIPASE, AMYLASE in the last 8760 hours. No results for input(s): AMMONIA in the last 8760 hours. CBC: Recent Labs    08/11/23 0814  WBC 6.6  NEUTROABS 4,448  HGB 13.7  HCT 41.6  MCV 91.0  PLT 189   Lipid Panel: Recent Labs    08/11/23 0814  CHOL 238*  HDL 70  LDLCALC 149*  TRIG 89  CHOLHDL 3.4   TSH: Recent Labs    08/11/23 0814  TSH 0.42   A1C: Lab Results  Component Value Date   HGBA1C 5.9 (H) 08/11/2023      Assessment/Plan Assessment and Plan Assessment & Plan Hemoptysis 3-day history of cough with hemoptysis 3 days ago- none since and right-sided pleuritic chest pain.  Overall feeling much better without fever, chest pain or shortness of breath - Order chest x-ray at outpatient imaging on [facility]. - Perform COVID swab which was negative - Prescribe Mucinex DM for symptomatic relief. - Order CBC and d-dimer to evaluate for infection or other hematological issues.   Follow up precautions reviewed Shatia Sindoni K. Caro BODILY  [facility] & Adult Medicine [phone]    Chest xray results show.   Pneumonia of right lower lobe Called to patient. Rx sent to pharmacy To repeat chest xray in 4 weeks Mucinex DM BID for 7 days with full glass of water - azithromycin  (ZITHROMAX ) 250 MG tablet; Take 2 tablets on day 1, then 1 tablet daily on days 2 through 5  Dispense: 6 tablet; Refill: 0 - amoxicillin -clavulanate (AUGMENTIN ) 875-125 MG tablet; Take 1 tablet by mouth 2 (two) times daily.  Dispense: 14 tablet; Refill: 0 - DG Chest 2 View; Future

## 2023-10-06 NOTE — Patient Instructions (Signed)
  When your son gets here today go get XRAY at imaging center  Address: [address], [place], [place] [address] Phone: [phone]  Mucinex DM by mouth twice daily with full glass of water for 7 days

## 2023-10-09 ENCOUNTER — Ambulatory Visit: Payer: Self-pay

## 2023-10-09 NOTE — Telephone Encounter (Signed)
 FYI Only or Action Required?: FYI only for provider.  Patient was last seen in primary care on 10/06/2023 by Diana Harlene POUR, NP.  Called Nurse Triage reporting Information Only.  Symptoms began several days ago.  Interventions attempted: Prescription medications: Augmentin / Zithromax .  Symptoms are: stable.  Triage Disposition: Information or Advice Only Call  Patient/caregiver understands and will follow disposition?: Yes  **Education provided, no further questions**                     Message from [place] G sent at 10/09/2023  2:29 PM EDT  Reason for Triage: questioning if she is contagious, patient was dx with pneumonia   Reason for Disposition  Health information question, no triage required and triager able to answer question  Answer Assessment - Initial Assessment Questions 1. REASON FOR CALL: What is the main reason for your call? or How can I best help you?   Patient was recently diagnosed with Pneumonia, and had questions about the contagiousness, as well as the duration. Education/ reasons to call back addressed, no further questions at this time.  Protocols used: Information Only Call - No Triage-A-AH

## 2023-10-12 ENCOUNTER — Other Ambulatory Visit: Payer: Self-pay | Admitting: Internal Medicine

## 2023-10-12 DIAGNOSIS — F39 Unspecified mood [affective] disorder: Secondary | ICD-10-CM

## 2023-10-13 ENCOUNTER — Encounter: Payer: Self-pay | Admitting: Nurse Practitioner

## 2023-10-13 ENCOUNTER — Ambulatory Visit: Admitting: Nurse Practitioner

## 2023-10-13 VITALS — BP 118/68 | HR 78 | Temp 97.2°F | Ht 61.0 in | Wt 127.2 lb

## 2023-10-13 DIAGNOSIS — J189 Pneumonia, unspecified organism: Secondary | ICD-10-CM

## 2023-10-13 NOTE — Patient Instructions (Addendum)
 To get chest xray 1 week before you see Dr Abdul on 11/04/2023 You do not need an appt you can just walk in and they will get it done Go to the same place you went last time for your chest xray

## 2023-10-13 NOTE — Progress Notes (Signed)
 Careteam: Patient Care Team: Abdul Fine, MD as PCP - General (Family Medicine) Fate Morna SAILOR, [facility] (Inactive) as Pharmacist (Pharmacist) PLACE OF SERVICE:  [facility]   Advanced Directive information Does Patient Have a Medical Advance Directive?: Yes, Type of Advance Directive: Out of facility DNR (pink MOST or yellow form), Does patient want to make changes to medical advance directive?: No - Patient declined  Allergies  Allergen Reactions   Codeine Nausea And Vomiting   Prevident 5000 Enamel Protect [Sod Fluoride-Potassium Nitrate]     Mouth ulcer developed   Adhesive [Tape] Rash    Paper tape is okay   Latex Rash    Probably adhesive allergy.  bandaids bother patient    Chief Complaint  Patient presents with   Pneumonia    Follow up Pneumonia. Feeling better. Son wants to know if patient is Contagious      HPI: Patient is a 89 y.o. female seen in today for follow up Discussed the use of AI scribe software for clinical note transcription with the patient, who gave verbal consent to proceed.  History of Present Illness Diana Owen is an 89 year old female who presents to determine if she is still contagious after being treated for pneumonia.  She was diagnosed with pneumonia one week ago after presenting with bloody sputum and a cough. A chest x-ray confirmed the diagnosis. She was prescribed azithromycin  and Augmentin , along with Mucinex twice a day for seven days. She will complete her antibiotics by tomorrow morning.  No fevers or chills are present. However, she describes feeling 'woozy' intermittently, which makes her want to take a nap. She is unsure if she is drinking enough water and notes that the wooziness is not constant but occurs off and on.   No diarrhea, chest pains, or shortness of breath. She mentions a history of dizziness but states that this current sensation feels different. She experienced mild pain right lower lung yesterday  evening, which she attributes to indigestion, but it has since resolved. Overall pain has resolved.   Her current medications include azithromycin , Augmentin , and Mucinex, which she is taking as prescribed and will complete in the am. She reports eating a boiled egg and some cereal for breakfast, along with drinking one and a half cups of liquid.     Review of Systems:  Review of Systems  Constitutional:  Negative for chills, fever and weight loss.  HENT:  Negative for tinnitus.   Respiratory:  Negative for cough, sputum production and shortness of breath.   Cardiovascular:  Negative for chest pain, palpitations and leg swelling.  Gastrointestinal:  Negative for abdominal pain, constipation, diarrhea and heartburn.  Genitourinary:  Negative for dysuria, frequency and urgency.  Musculoskeletal:  Negative for back pain, falls, joint pain and myalgias.  Skin: Negative.   Neurological:  Negative for dizziness and headaches.  Psychiatric/Behavioral:  Negative for depression and memory loss. The patient does not have insomnia.     Past Medical History:  Diagnosis Date   Anxiety    Arthritis    finger!!   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 if patient gets up during meal to let food pass, then n & v is not so bad   Midgut volvulus    SBO (  small bowel obstruction) (HCC) 11/13/2016   Small bowel obstruction (HCC)    Small bowel obstruction due to postoperative adhesions 06/29/2017   Thyroid  disease    Past Surgical History:  Procedure Laterality Date   APPENDECTOMY  2002   LAPAROTOMY N/A 09/28/2017   Procedure: EXPLORATORY LAPAROTOMY WITH LYSIS OF ADHESIONS;  Surgeon: Nicholaus Selinda Birmingham, MD;  Location: [facility] ORS;  Service: General;  Laterality: N/A;   PARTIAL COLECTOMY   2002   large polyp   TONSILLECTOMY     TUBAL LIGATION     Social History:   reports that she has never smoked. She has been exposed to tobacco smoke. She has never used smokeless tobacco. She reports current alcohol use of about 7.0 standard drinks of alcohol per week. She reports that she does not use drugs.  Family History  Problem Relation Age of Onset   Leukemia Mother        CLL   Leukemia Father    Hyperlipidemia Brother    Stroke Brother    Heart disease Paternal Uncle    Diabetes Neg Hx     Medications: Patient's Medications  New Prescriptions   No medications on file  Previous Medications   ACETAMINOPHEN  (TYLENOL ) 500 MG TABLET    Take 500 mg by mouth every 6 (six) hours as needed.   ALPRAZOLAM  (XANAX ) 0.25 MG TABLET    Take 0.5 tablets (0.125 mg total) by mouth at bedtime as needed for anxiety. TAKE 1/2 TABLET (0.125) BY MOUTH DAILY AS NEEDED FOR ANXIETY   AMOXICILLIN -CLAVULANATE (AUGMENTIN ) 875-125 MG TABLET    Take 1 tablet by mouth 2 (two) times daily.   ATORVASTATIN  (LIPITOR) 40 MG TABLET    Take 1 tablet (40 mg total) by mouth daily.   BIOTIN PO    Take by mouth.   CHOLECALCIFEROL (VITAMIN D ) 1000 UNITS TABLET    Take 2,000 Units by mouth daily.   CYANOCOBALAMIN  (VITAMIN B-12) 6000 MCG SUBL    Place 1 tablet under the tongue daily.   ESCITALOPRAM  (LEXAPRO ) 5 MG TABLET    Take 1 tablet (5 mg total) by mouth every other day.   LEVOTHYROXINE  (SYNTHROID ) 100 MCG TABLET    Take 1 tablet (100 mcg total) by mouth daily before breakfast.   MECLIZINE  (ANTIVERT ) 25 MG TABLET    Take 25 mg by mouth 3 (three) times daily as needed for dizziness.   MULTIPLE VITAMINS-MINERALS (MULTIVITAMIN WITH MINERALS) TABLET    Take 1 tablet by mouth daily.   OMEPRAZOLE  (PRILOSEC) 20 MG CAPSULE    Take 40 mg by mouth daily.   POLYETHYLENE GLYCOL (MIRALAX / GLYCOLAX) PACKET    Take 17 g by mouth daily.   SIMETHICONE (MYLICON) 80 MG CHEWABLE TABLET    Chew 80 mg by mouth every 6 (six) hours as  needed for flatulence.   VITAMIN E 100 UNIT CAPSULE    Take 100 Units by mouth daily.  Modified Medications   No medications on file  Discontinued Medications   No medications on file    Physical Exam:  Vitals:   10/13/23 0929  BP: 118/68  Pulse: 78  Temp: (!) 97.2 F (36.2 C)  SpO2: 94%  Weight: 127 lb 3.2 oz (57.7 kg)  Height: 5' 1 (1.549 m)   Body mass index is 24.03 kg/m. Wt Readings from Last 3 Encounters:  10/13/23 127 lb 3.2 oz (57.7 kg)  10/06/23 125 lb 12.8 oz (57.1 kg)  08/12/23 123 lb (55.8 kg)  Physical Exam Constitutional:      General: She is not in acute distress.    Appearance: She is well-developed. She is not diaphoretic.  HENT:     Head: Normocephalic and atraumatic.     Mouth/Throat:     Pharynx: No oropharyngeal exudate.  Eyes:     Conjunctiva/sclera: Conjunctivae normal.     Pupils: Pupils are equal, round, and reactive to light.  Cardiovascular:     Rate and Rhythm: Normal rate and regular rhythm.     Heart sounds: Normal heart sounds.  Pulmonary:     Effort: Pulmonary effort is normal.     Breath sounds: Normal breath sounds.  Abdominal:     General: Bowel sounds are normal.     Palpations: Abdomen is soft.  Musculoskeletal:     Cervical back: Normal range of motion and neck supple.     Right lower leg: No edema.     Left lower leg: No edema.  Skin:    General: Skin is warm and dry.  Neurological:     Mental Status: She is alert.  Psychiatric:        Mood and Affect: Mood normal.     Labs reviewed: Basic Metabolic Panel: Recent Labs    08/11/23 0814  NA 141  K 4.1  CL 105  CO2 28  GLUCOSE 87  BUN 20  CREATININE 0.64  CALCIUM  9.2  TSH 0.42   Liver Function Tests: Recent Labs    08/11/23 0814  AST 19  ALT 16  BILITOT 0.7  PROT 6.3   No results for input(s): LIPASE, AMYLASE in the last 8760 hours. No results for input(s): AMMONIA in the last 8760 hours. CBC: Recent Labs    08/11/23 0814  10/06/23 1234  WBC 6.6 9.5  NEUTROABS 4,448 6,755  HGB 13.7 13.8  HCT 41.6 41.8  MCV 91.0 91.7  PLT 189 242   Lipid Panel: Recent Labs    08/11/23 0814  CHOL 238*  HDL 70  LDLCALC 149*  TRIG 89  CHOLHDL 3.4   TSH: Recent Labs    08/11/23 0814  TSH 0.42   A1C: Lab Results  Component Value Date   HGBA1C 5.9 (H) 08/11/2023     Assessment/Plan Assessment and Plan Assessment & Plan Pneumonia Pneumonia diagnosed one week ago, treated with azithromycin , Augmentin , and Mucinex, showing improvement. No fever, chills, chest pain, or dyspnea.   Full recovery may take weeks. Not contagious. - Complete antibiotics by tomorrow morning. - Encourage hydration and nutrition for dizziness.  - Report persistent or worsening dizziness and make follow up appt.  - Obtain chest x-ray in three weeks before follow-up with Dr. Abdul.   Shatavia Santor K. Caro BODILY  [facility] & Adult Medicine [phone]

## 2023-10-14 ENCOUNTER — Telehealth: Payer: Self-pay

## 2023-10-14 NOTE — Telephone Encounter (Signed)
 Patient Caregiver Tammy dropped off Handicap Placard for Dr. Abdul to sign.  To call Tammy #[phone] once completed.   Filled out and placed for Dr. Abdul to sign at [facility].

## 2023-10-14 NOTE — Telephone Encounter (Signed)
 Form filled out and signed and Tammy notified for pick up.   Copy sent to scanning.

## 2023-10-14 NOTE — Telephone Encounter (Signed)
 Copied from CRM [id number]. Topic: General - Other >> Oct 14, 2023 12:00 PM Adrianna P wrote: Reason for CRM: Patient needs Dr. Abdul to sign her handicap sticker paperwork hopefully today while at the clinic

## 2023-10-21 ENCOUNTER — Ambulatory Visit: Payer: Self-pay

## 2023-10-21 NOTE — Telephone Encounter (Signed)
 FYI Only or Action Required?: FYI only for provider.  Patient was last seen in primary care on 10/13/2023 by [name], NP.  Called Nurse Triage reporting Sinusitis.  Symptoms began several days ago.  Interventions attempted: Nothing.  Symptoms are: unchanged.  Triage Disposition: Home Care  Patient/caregiver understands and will follow disposition?: No, wishes to speak with PCP  Copied from CRM #[id number]. Topic: Clinical - Red Word Triage >> Oct 21, 2023  9:50 AM Miquel SAILOR wrote: Red Word that prompted transfer to Nurse Triage: Sinus infection Throat/forehead 1-2 days getting worse /had pneumonia 1 week Reason for Disposition  [1] Sinus congestion as part of a cold AND [2] present < 10 days  Answer Assessment - Initial Assessment Questions 1. LOCATION: Where does it hurt?      Throat and sinus drainage 2. ONSET: When did the sinus pain start?  (e.g., hours, days)      2 days ago worsened 3. SEVERITY: How bad is the pain?   (Scale 0-10; or none, mild, moderate or severe)     0 4. RECURRENT SYMPTOM: Have you ever had sinus problems before? If Yes, ask: When was the last time? and What happened that time?      Hx of sinus infection 5. NASAL CONGESTION: Is the nose blocked? If Yes, ask: Can you open it or must you breathe through your mouth?     Runny nose 6. NASAL DISCHARGE: Do you have discharge from your nose? If so ask, What color?     unsure 7. FEVER: Do you have a fever? If Yes, ask: What is it, how was it measured, and when did it start?      denies 8. OTHER SYMPTOMS: Do you have any other symptoms? (e.g., sore throat, cough, earache, difficulty breathing)     Sore throat,  Protocols used: Sinus Pain or Congestion-A-AH

## 2023-10-22 ENCOUNTER — Encounter: Payer: Self-pay | Admitting: Nurse Practitioner

## 2023-10-22 ENCOUNTER — Ambulatory Visit: Admitting: Nurse Practitioner

## 2023-10-22 VITALS — BP 122/64 | HR 78 | Temp 97.9°F | Ht 61.0 in | Wt 127.8 lb

## 2023-10-22 DIAGNOSIS — R0982 Postnasal drip: Secondary | ICD-10-CM | POA: Diagnosis not present

## 2023-10-22 NOTE — Progress Notes (Signed)
 Careteam: Patient Care Team: Abdul Fine, MD as PCP - General (Family Medicine) Fate Morna SAILOR, [facility] (Inactive) as Pharmacist (Pharmacist) PLACE OF SERVICE:  [facility]   Advanced Directive information    Allergies  Allergen Reactions   Codeine Nausea And Vomiting   Prevident 5000 Enamel Protect [Sod Fluoride-Potassium Nitrate]     Mouth ulcer developed   Adhesive [Tape] Rash    Paper tape is okay   Latex Rash    Probably adhesive allergy.  bandaids bother patient    Chief Complaint  Patient presents with   Nasal Drainage    Nasal Drainage. Been going on for about a year.      HPI: Patient is a 89 y.o. female seen in today for nasal drainage at [facility]  Discussed the use of AI scribe software for clinical note transcription with the patient, who gave verbal consent to proceed.  History of Present Illness Diana Owen is an 89 year old female with a history of COVID-19 and pneumonia who presents with persistent nasal drainage and cough.  She has experienced persistent white nasal drainage for over a year since having COVID-19. The drainage is present most of the time and causes her to cough, described as postnasal drip, more noticeable in the morning upon waking. A particularly concerning episode of excessive drainage prompted her to seek medical attention due to fear of developing pneumonia again.  She had pneumonia in the past and was treated with two different antibiotics, but the nasal drainage persisted despite this treatment. She has not taken any specific medication for the drainage itself, although she was on some medication alongside the antibiotics, the name of which she does not recall.  No congestion, shortness of breath, or sore throat. No shortness of breath or significant congestion, with the drainage being the primary issue.   She mentions that her eyes are dry and she no longer produces tears, which she associates with aging.       Review of Systems:  Review of Systems  Constitutional:  Negative for chills, fever and weight loss.  HENT:  Negative for congestion and tinnitus.        Nasal drainage.  Respiratory:  Positive for cough. Negative for sputum production and shortness of breath.   Cardiovascular:  Negative for chest pain, palpitations and leg swelling.  Gastrointestinal:  Negative for abdominal pain, constipation, diarrhea and heartburn.  Genitourinary:  Negative for dysuria, frequency and urgency.  Musculoskeletal:  Negative for back pain, falls, joint pain and myalgias.  Skin: Negative.   Neurological:  Negative for dizziness and headaches.    Past Medical History:  Diagnosis Date   Anxiety    Arthritis    finger!!   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 if patient gets up during meal to let food pass, then n & v is not so bad   Midgut volvulus    SBO (small bowel obstruction) (HCC) 11/13/2016   Small bowel obstruction (HCC)    Small bowel obstruction due to postoperative adhesions 06/29/2017   Thyroid  disease    Past Surgical History:  Procedure Laterality Date   APPENDECTOMY  2002   LAPAROTOMY N/A 09/28/2017   Procedure: EXPLORATORY LAPAROTOMY WITH LYSIS OF ADHESIONS;  Surgeon: Nicholaus Selinda Birmingham, MD;  Location: [facility] ORS;  Service: General;  Laterality: N/A;   PARTIAL COLECTOMY  2002   large polyp   TONSILLECTOMY     TUBAL LIGATION     Social History:   reports that she has never smoked. She has been exposed to tobacco smoke. She has never used smokeless tobacco. She reports current alcohol use of about 7.0 standard drinks of alcohol per week. She reports that she does not use drugs.  Family History  Problem Relation Age of Onset   Leukemia Mother        CLL    Leukemia Father    Hyperlipidemia Brother    Stroke Brother    Heart disease Paternal Uncle    Diabetes Neg Hx     Medications: Patient's Medications  New Prescriptions   No medications on file  Previous Medications   ACETAMINOPHEN  (TYLENOL ) 500 MG TABLET    Take 500 mg by mouth every 6 (six) hours as needed.   ALPRAZOLAM  (XANAX ) 0.25 MG TABLET    Take 0.5 tablets (0.125 mg total) by mouth at bedtime as needed for anxiety. TAKE 1/2 TABLET (0.125) BY MOUTH DAILY AS NEEDED FOR ANXIETY   AMOXICILLIN -CLAVULANATE (AUGMENTIN ) 875-125 MG TABLET    Take 1 tablet by mouth 2 (two) times daily.   ATORVASTATIN  (LIPITOR) 40 MG TABLET    Take 1 tablet (40 mg total) by mouth daily.   BIOTIN PO    Take by mouth.   CHOLECALCIFEROL (VITAMIN D ) 1000 UNITS TABLET    Take 2,000 Units by mouth daily.   CYANOCOBALAMIN  (VITAMIN B-12) 6000 MCG SUBL    Place 1 tablet under the tongue daily.   ESCITALOPRAM  (LEXAPRO ) 5 MG TABLET    Take 1 tablet (5 mg total) by mouth every other day.   LEVOTHYROXINE  (SYNTHROID ) 100 MCG TABLET    Take 1 tablet (100 mcg total) by mouth daily before breakfast.   MECLIZINE  (ANTIVERT ) 25 MG TABLET    Take 25 mg by mouth 3 (three) times daily as needed for dizziness.   MULTIPLE VITAMINS-MINERALS (MULTIVITAMIN WITH MINERALS) TABLET    Take 1 tablet by mouth daily.   OMEPRAZOLE  (PRILOSEC) 20 MG CAPSULE    Take 40 mg by mouth daily.   POLYETHYLENE GLYCOL (MIRALAX / GLYCOLAX) PACKET    Take 17 g by mouth daily.   SIMETHICONE (MYLICON) 80 MG CHEWABLE TABLET    Chew 80 mg by mouth every 6 (six) hours as needed for flatulence.   VITAMIN E 100 UNIT CAPSULE    Take 100 Units by mouth daily.  Modified Medications   No medications on file  Discontinued Medications   No medications on file    Physical Exam:  Vitals:   10/22/23 0911  BP: 122/64  Pulse: 78  Temp: 97.9 F (36.6 C)  SpO2: 95%  Weight: 127 lb 12.8 oz (58 kg)  Height: 5' 1 (1.549 m)   Body mass index is 24.15 kg/m. Wt  Readings from Last 3 Encounters:  10/22/23 127 lb 12.8 oz (58 kg)  10/13/23 127 lb 3.2 oz (57.7 kg)  10/06/23 125 lb 12.8 oz (57.1 kg)    Physical Exam Constitutional:      General: She is not in acute distress.    Appearance: She is well-developed. She is not diaphoretic.  HENT:     Head: Normocephalic and atraumatic.     Nose: Congestion and rhinorrhea present.     Mouth/Throat:     Mouth: Mucous membranes are moist.  Pharynx: Oropharynx is clear. No oropharyngeal exudate or posterior oropharyngeal erythema.  Eyes:     Conjunctiva/sclera: Conjunctivae normal.     Pupils: Pupils are equal, round, and reactive to light.  Cardiovascular:     Rate and Rhythm: Normal rate and regular rhythm.     Heart sounds: Normal heart sounds.  Pulmonary:     Effort: Pulmonary effort is normal.     Breath sounds: Normal breath sounds.  Abdominal:     General: Bowel sounds are normal.     Palpations: Abdomen is soft.  Musculoskeletal:     Cervical back: Normal range of motion and neck supple.     Right lower leg: No edema.     Left lower leg: No edema.  Skin:    General: Skin is warm and dry.  Neurological:     Mental Status: She is alert.  Psychiatric:        Mood and Affect: Mood normal.     Labs reviewed: Basic Metabolic Panel: Recent Labs    08/11/23 0814  NA 141  K 4.1  CL 105  CO2 28  GLUCOSE 87  BUN 20  CREATININE 0.64  CALCIUM  9.2  TSH 0.42   Liver Function Tests: Recent Labs    08/11/23 0814  AST 19  ALT 16  BILITOT 0.7  PROT 6.3   No results for input(s): LIPASE, AMYLASE in the last 8760 hours. No results for input(s): AMMONIA in the last 8760 hours. CBC: Recent Labs    08/11/23 0814 10/06/23 1234  WBC 6.6 9.5  NEUTROABS 4,448 6,755  HGB 13.7 13.8  HCT 41.6 41.8  MCV 91.0 91.7  PLT 189 242   Lipid Panel: Recent Labs    08/11/23 0814  CHOL 238*  HDL 70  LDLCALC 149*  TRIG 89  CHOLHDL 3.4   TSH: Recent Labs    08/11/23 0814   TSH 0.42   A1C: Lab Results  Component Value Date   HGBA1C 5.9 (H) 08/11/2023     Assessment/Plan Assessment and Plan Assessment & Plan Chronic postnasal drip with cough Chronic postnasal drip for over a year post-COVID-19 with white drainage and occasional cough. Symptoms are bothersome but not severe. No significant congestion, sore throat, or shortness of breath.  Reassured her that postnasal drip does not cause pneumonia. - Recommend Flonase  nasal spray, one spray in each nostril once or twice daily - Suggest Claritin or Zyrtec daily as needed for nasal drainage. - Advise against excessive nose blowing to prevent further congestion.  Diana Owen K. Caro BODILY  [facility] [phone]

## 2023-10-22 NOTE — Patient Instructions (Addendum)
 To take Loratadine or cetrizine (generic for Claritin or zyrtec) 10 mg by mouth daily for allergies.  Can use flonase  1 spray into both nares daily for congestion/post nasal drip.

## 2023-11-03 ENCOUNTER — Telehealth: Payer: Self-pay

## 2023-11-03 NOTE — Telephone Encounter (Signed)
 Copied from CRM #[id number]. Topic: General - Other >> Nov 03, 2023 10:01 AM Mercer PEDLAR wrote: Reason for CRM: Patient called stating that she was told to get an x-ray before her next appointment but does not have any paperwork or the order for the x-ray with her. Patient would like a callback to let her know if she need to take anything with her or if she can jut go get the x-ray done.

## 2023-11-03 NOTE — Telephone Encounter (Signed)
Message routed to referral coordinator.

## 2023-11-04 ENCOUNTER — Ambulatory Visit
Admission: RE | Admit: 2023-11-04 | Discharge: 2023-11-04 | Disposition: A | Discharge status: Home / Self Care | Source: Ambulatory Visit | Attending: Nurse Practitioner | Admitting: Nurse Practitioner

## 2023-11-04 ENCOUNTER — Ambulatory Visit
Admission: RE | Admit: 2023-11-04 | Discharge: 2023-11-04 | Disposition: A | Discharge status: Home / Self Care | Attending: Nurse Practitioner | Admitting: Nurse Practitioner

## 2023-11-04 DIAGNOSIS — I700 Atherosclerosis of aorta: Secondary | ICD-10-CM | POA: Diagnosis not present

## 2023-11-04 DIAGNOSIS — J189 Pneumonia, unspecified organism: Secondary | ICD-10-CM | POA: Diagnosis not present

## 2023-11-11 ENCOUNTER — Encounter: Admitting: Student

## 2023-11-13 ENCOUNTER — Ambulatory Visit: Admitting: Student

## 2023-11-13 ENCOUNTER — Encounter: Payer: Self-pay | Admitting: Student

## 2023-11-13 VITALS — BP 124/66 | HR 82 | Temp 97.4°F | Ht 61.0 in | Wt 126.0 lb

## 2023-11-13 DIAGNOSIS — R0982 Postnasal drip: Secondary | ICD-10-CM | POA: Diagnosis not present

## 2023-11-13 DIAGNOSIS — R2689 Other abnormalities of gait and mobility: Secondary | ICD-10-CM

## 2023-11-13 DIAGNOSIS — J302 Other seasonal allergic rhinitis: Secondary | ICD-10-CM

## 2023-11-13 DIAGNOSIS — R7303 Prediabetes: Secondary | ICD-10-CM | POA: Diagnosis not present

## 2023-11-13 DIAGNOSIS — R042 Hemoptysis: Secondary | ICD-10-CM | POA: Diagnosis not present

## 2023-11-13 MED ORDER — CETIRIZINE HCL 10 MG PO TABS: 10.0000 mg | ORAL_TABLET | Freq: Every day | ORAL | 1 refills | Status: AC

## 2023-11-13 MED ORDER — PREDNISONE 20 MG PO TABS: 20.0000 mg | ORAL_TABLET | Freq: Every day | ORAL | 0 refills | Status: AC

## 2023-11-13 NOTE — Progress Notes (Signed)
 [facility]: [facility] Provider: ABDUL  Code Status: zDNR Goals of Care:     10/13/2023    9:32 AM  Advanced Directives  Does Patient Have a Medical Advance Directive? Yes  Type of Advance Directive Out of facility DNR (pink MOST or yellow form)  Does patient want to make changes to medical advance directive? No - Patient declined     Chief Complaint  Patient presents with   Medical Management of Chronic Issues    Medical Management of Chronic Issues. 3 Month follow up. Still having some nasal drainage and Cough    HPI: Patient is a 89 y.o. female seen today for medical management of chronic diseases.   Discussed the use of AI scribe software for clinical note transcription with the patient, who gave verbal consent to proceed.  History of Present Illness   Diana Owen is an 89 year old female who presents with chronic cough and postnasal drip.  She has been experiencing a persistent cough that intensifies when eating and drinking. The cough began after contracting COVID-19 approximately a year and a half ago. No fevers have been noted. The postnasal drip is described as thick, triggering coughing episodes. She has been using a nasal spray daily and previously took Claritin. The sputum is usually pale beige, occasionally with a small amount of dark brown.  She has a history of scoliosis and reports balance issues, stating she can walk around the house as long as there is something nearby to hold onto. She has not experienced any falls, attributing this to the use of a walker.  Her current medications include atorvastatin  every other day for high cholesterol, citalopram and alprazolam  for mood and anxiety, respectively, with alprazolam  being taken daily. She also takes omeprazole  40 mg every other day for acid reflux. She mentions watery eyes, which she associates with her allergy symptoms.  Her son has Parkinson's disease, and she discusses dietary  considerations with him, emphasizing the importance of diet in managing his condition. She also mentions a recent encounter with a friend who had been exposed to COVID-19, but she has not tested herself as she has not been in close contact with others who have the virus.         Past Medical History:  Diagnosis Date   Anxiety    Arthritis    finger!!   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 if patient gets up during meal to let food pass, then n & v is not so bad   Midgut volvulus    SBO (small bowel obstruction) (HCC) 11/13/2016   Small bowel obstruction (HCC)    Small bowel obstruction due to postoperative adhesions 06/29/2017   Thyroid  disease     Past Surgical History:  Procedure Laterality Date   APPENDECTOMY  2002   LAPAROTOMY N/A 09/28/2017   Procedure: EXPLORATORY LAPAROTOMY WITH LYSIS OF ADHESIONS;  Surgeon: Nicholaus Selinda Birmingham, MD;  Location: [facility] ORS;  Service: General;  Laterality: N/A;   PARTIAL COLECTOMY  2002   large polyp   TONSILLECTOMY     TUBAL LIGATION      Allergies  Allergen Reactions   Codeine Nausea And Vomiting   Prevident 5000 Enamel Protect [Sod Fluoride-Potassium Nitrate]     Mouth  ulcer developed   Adhesive [Tape] Rash    Paper tape is okay   Latex Rash    Probably adhesive allergy.  bandaids bother patient    Outpatient Encounter Medications as of 11/13/2023  Medication Sig   acetaminophen  (TYLENOL ) 500 MG tablet Take 500 mg by mouth every 6 (six) hours as needed.   ALPRAZolam  (XANAX ) 0.25 MG tablet Take 0.5 tablets (0.125 mg total) by mouth at bedtime as needed for anxiety. TAKE 1/2 TABLET (0.125) BY MOUTH DAILY AS NEEDED FOR ANXIETY   atorvastatin  (LIPITOR) 40 MG tablet Take 1 tablet (40 mg total) by mouth daily.   BIOTIN PO Take  by mouth.   cetirizine  (ZYRTEC  ALLERGY) 10 MG tablet Take 1 tablet (10 mg total) by mouth daily.   cholecalciferol (VITAMIN D ) 1000 units tablet Take 2,000 Units by mouth daily.   Cyanocobalamin  (VITAMIN B-12) 6000 MCG SUBL Place 1 tablet under the tongue daily.   escitalopram  (LEXAPRO ) 5 MG tablet Take 1 tablet (5 mg total) by mouth every other day.   levothyroxine  (SYNTHROID ) 100 MCG tablet Take 1 tablet (100 mcg total) by mouth daily before breakfast.   meclizine  (ANTIVERT ) 25 MG tablet Take 25 mg by mouth 3 (three) times daily as needed for dizziness.   Multiple Vitamins-Minerals (MULTIVITAMIN WITH MINERALS) tablet Take 1 tablet by mouth daily.   omeprazole  (PRILOSEC) 20 MG capsule Take 40 mg by mouth daily.   polyethylene glycol (MIRALAX / GLYCOLAX) packet Take 17 g by mouth daily.   predniSONE  (DELTASONE ) 20 MG tablet Take 1 tablet (20 mg total) by mouth daily with breakfast for 5 days.   simethicone (MYLICON) 80 MG chewable tablet Chew 80 mg by mouth every 6 (six) hours as needed for flatulence.   vitamin E 100 UNIT capsule Take 100 Units by mouth daily.   amoxicillin -clavulanate (AUGMENTIN ) 875-125 MG tablet Take 1 tablet by mouth 2 (two) times daily. (Patient not taking: Reported on 11/13/2023)   No facility-administered encounter medications on file as of 11/13/2023.    Review of Systems:  Review of Systems  Health Maintenance  Topic Date Due   INFLUENZA VACCINE  10/16/2023   COVID-19 Vaccine (9 - 2024-25 season) 01/20/2024   Medicare Annual Wellness (AWV)  03/05/2024   DTaP/Tdap/Td (3 - Td or Tdap) 03/30/2033   Pneumococcal Vaccine: 50+ Years  Completed   DEXA SCAN  Completed   Zoster Vaccines- Shingrix  Completed   HPV VACCINES  Aged Out   Meningococcal B Vaccine  Aged Out    Physical Exam: Vitals:   11/13/23 0842  BP: 124/66  Pulse: 82  Temp: (!) 97.4 F (36.3 C)  SpO2: 96%  Weight: 126 lb (57.2 kg)  Height: 5' 1 (1.549 m)   Body mass index is 23.81  kg/m. Physical Exam Physical Exam   CHEST: Lungs clear to auscultation bilaterally.      Labs reviewed: Basic Metabolic Panel: Recent Labs    08/11/23 0814  NA 141  K 4.1  CL 105  CO2 28  GLUCOSE 87  BUN 20  CREATININE 0.64  CALCIUM  9.2  TSH 0.42   Liver Function Tests: Recent Labs    08/11/23 0814  AST 19  ALT 16  BILITOT 0.7  PROT 6.3   No results for input(s): LIPASE, AMYLASE in the last 8760 hours. No results for input(s): AMMONIA in the last 8760 hours. CBC: Recent Labs    08/11/23 0814 10/06/23 1234  WBC 6.6 9.5  NEUTROABS 4,448 6,755  HGB  13.7 13.8  HCT 41.6 41.8  MCV 91.0 91.7  PLT 189 242   Lipid Panel: Recent Labs    08/11/23 0814  CHOL 238*  HDL 70  LDLCALC 149*  TRIG 89  CHOLHDL 3.4   Lab Results  Component Value Date   HGBA1C 5.9 (H) 08/11/2023    Procedures since last visit: DG Chest 2 View Result Date: 11/10/2023 EXAM: 2 VIEW(S) XRAY OF THE CHEST 11/04/2023 02:57:04 PM COMPARISON: 10/06/2023 CLINICAL HISTORY: Follow up pneumonia. Pneumonia of right lower lobe due to infectious organism. Pt states she has no symptoms at this time. FINDINGS: LUNGS AND PLEURA: No focal pulmonary opacity. No pulmonary edema. No pleural effusion. No pneumothorax. HEART AND MEDIASTINUM: No acute abnormality of the cardiac and mediastinal silhouettes. Aortic calcification. BONES AND SOFT TISSUES: No acute osseous abnormality. S shaped curvature of thoracolumbar spine. Multilevel thoracic degenerative changes. IMPRESSION: 1. No acute findings. Electronically signed by: Franky Stanford MD 11/10/2023 11:59 AM EDT RP Workstation: [id number]   Results   RADIOLOGY Chest x-ray: No acute findings, no acute osseous abnormalities, S-shaped thoracolumbar spine, scoliosis known (11/04/2023) CT scan: No concerning findings      Assessment/Plan     Chronic cough with postnasal drip and seasonal allergic rhinitis Chronic cough with postnasal drip and seasonal  allergic rhinitis persisting since COVID-19 infection over a year ago. Symptoms include watery eyes and postnasal drip, with cough exacerbated by eating and drinking. Recent chest x-ray showed no acute findings. Current allergy management with Claritin is ineffective. - Discontinue Claritin. - Initiate Zyrtec  for allergy management. - Prescribe a 5-day course of steroids to address symptoms.  Abnormal gait and balance impairment due to scoliosis Abnormal gait and balance impairment attributed to known scoliosis. No recent falls reported, but balance issues persist, requiring use of a walker for stability. She seeks assistance from [facility], a specialist in scoliosis, for further management. - Order consultation with Penne for scoliosis management.  Hyperlipidemia Hyperlipidemia managed with atorvastatin , taken every other day. Last blood work three months ago showed elevated cholesterol levels. She is mindful of diet, consuming limited sweets. - Continue atorvastatin  every other day. - Plan to recheck cholesterol levels in approximately three months.  Follow-Up Follow-up needed to assess response to medication changes and monitor chronic conditions. - Schedule follow-up appointment in one month. - Arrange for lab work prior to the next appointment to recheck cholesterol and blood sugar levels.          Labs/tests ordered:  * No order type specified * Next appt:  12/18/2023

## 2023-11-13 NOTE — Patient Instructions (Signed)
 VISIT SUMMARY:  Today, we discussed your chronic cough and postnasal drip, which have been ongoing since your COVID-19 infection over a year ago. We also reviewed your balance issues related to scoliosis, your cholesterol management, and your general health maintenance.  YOUR PLAN:  -CHRONIC COUGH WITH POSTNASAL DRIP AND SEASONAL ALLERGIC RHINITIS: Your chronic cough and postnasal drip are likely due to allergies and have persisted since your COVID-19 infection. We will stop Claritin and start you on Zyrtec  for better allergy management. Additionally, you will take a 5-day course of steroids to help with your symptoms.  -ABNORMAL GAIT AND BALANCE IMPAIRMENT DUE TO SCOLIOSIS: Your balance issues are related to your scoliosis, which affects your spine. You will continue using your walker for stability, and we will arrange a consultation with Penne, a specialist in scoliosis, for further management.  -HYPERLIPIDEMIA: Hyperlipidemia means you have high cholesterol levels. You will continue taking atorvastatin  every other day, and we will recheck your cholesterol levels in about three months.  INSTRUCTIONS:  Please schedule a follow-up appointment in one month to assess your response to the new medications and monitor your chronic conditions. Also, arrange for lab work before your next appointment to recheck your cholesterol and blood sugar levels.

## 2023-11-24 DIAGNOSIS — R2681 Unsteadiness on feet: Secondary | ICD-10-CM | POA: Diagnosis not present

## 2023-11-24 DIAGNOSIS — M6281 Muscle weakness (generalized): Secondary | ICD-10-CM | POA: Diagnosis not present

## 2023-11-24 DIAGNOSIS — R2689 Other abnormalities of gait and mobility: Secondary | ICD-10-CM | POA: Diagnosis not present

## 2023-12-02 DIAGNOSIS — R2681 Unsteadiness on feet: Secondary | ICD-10-CM | POA: Diagnosis not present

## 2023-12-02 DIAGNOSIS — M6281 Muscle weakness (generalized): Secondary | ICD-10-CM | POA: Diagnosis not present

## 2023-12-04 DIAGNOSIS — M6281 Muscle weakness (generalized): Secondary | ICD-10-CM | POA: Diagnosis not present

## 2023-12-04 DIAGNOSIS — R2681 Unsteadiness on feet: Secondary | ICD-10-CM | POA: Diagnosis not present

## 2023-12-07 DIAGNOSIS — R2689 Other abnormalities of gait and mobility: Secondary | ICD-10-CM | POA: Diagnosis not present

## 2023-12-07 DIAGNOSIS — M6281 Muscle weakness (generalized): Secondary | ICD-10-CM | POA: Diagnosis not present

## 2023-12-14 DIAGNOSIS — R7303 Prediabetes: Secondary | ICD-10-CM | POA: Diagnosis not present

## 2023-12-15 LAB — LIPID PANEL
Cholesterol: 179 mg/dL (ref <200)
HDL: 74 mg/dL (ref >=50)
LDL Cholesterol (Calc): 91 mg/dL
Non-HDL Cholesterol (Calc): 105 mg/dL (ref <130)
Total CHOL/HDL Ratio: 2.4 (calc) (ref <5.0)
Triglycerides: 48 mg/dL (ref <150)

## 2023-12-15 LAB — HEMOGLOBIN A1C
Hgb A1c MFr Bld: 5.9 % — ABNORMAL HIGH (ref <5.7)
Mean Plasma Glucose: 123 mg/dL
eAG (mmol/L): 6.8 mmol/L

## 2023-12-18 ENCOUNTER — Encounter: Payer: Self-pay | Admitting: Internal Medicine

## 2023-12-18 ENCOUNTER — Non-Acute Institutional Stay: Admitting: Internal Medicine

## 2023-12-18 VITALS — BP 124/76 | HR 87 | Temp 97.8°F | Ht 61.0 in | Wt 128.2 lb

## 2023-12-18 DIAGNOSIS — M4125 Other idiopathic scoliosis, thoracolumbar region: Secondary | ICD-10-CM | POA: Diagnosis not present

## 2023-12-18 DIAGNOSIS — I700 Atherosclerosis of aorta: Secondary | ICD-10-CM

## 2023-12-18 DIAGNOSIS — R2689 Other abnormalities of gait and mobility: Secondary | ICD-10-CM | POA: Diagnosis not present

## 2023-12-18 DIAGNOSIS — Z9181 History of falling: Secondary | ICD-10-CM | POA: Diagnosis not present

## 2023-12-18 DIAGNOSIS — R2681 Unsteadiness on feet: Secondary | ICD-10-CM | POA: Diagnosis not present

## 2023-12-18 DIAGNOSIS — E039 Hypothyroidism, unspecified: Secondary | ICD-10-CM | POA: Diagnosis not present

## 2023-12-18 DIAGNOSIS — M6281 Muscle weakness (generalized): Secondary | ICD-10-CM | POA: Diagnosis not present

## 2023-12-18 NOTE — Patient Instructions (Addendum)
 See assessment and plan under each diagnosis in the problem list and acutely for this visit:  Aortic atherosclerosis Although imaging has revealed atherosclerosis; she has no anginal equivalent.  There is no personal history of MI or CVA.   On 01/01/2021 LDL was 147; apparently she was not on a statin at that time.  Statin holiday could be considered with recheck of the LDL after 8-10 weeks off the medicine.  If LDL were excessively high; rosuvastatin might be a better statin choice at her age.  Hypothyroidism TSH is significantly suppressed at 0.420 (normal 0.35-4.5) on L-thyroxine 100 mcg.  I would recommend decreasing to 75 mcg daily at the next refill and rechecking the TSH in 8-10 weeks.  TSH goal for her age will be no lower than 1 and as high as 5 to decrease risk of arrhythmias.  Other idiopathic scoliosis, thoracolumbar region She denies any urinary or stool incontinence or radicular lower extremity symptoms.  She must ambulate with a rolling walker.  I would recommend that she avoid any drugs on the Beers ' List which could increase risk of falls.  This will include a alprazolam  as well as the sedating agent cetirizine .  Loratadine or fexofenadine, nonsedating antihistamines would be preferable.  At risk for falls She should use a nonsedating antihistamine such as loratadine or fexofenadine as opposed to cetirizine .  Additionally alprazolam  should be taken as infrequently as possible.

## 2023-12-18 NOTE — Progress Notes (Signed)
 NURSING HOME LOCATION: [facility]  CODE STATUS: DNR  PCP: Abdul Fine, MD   This is a nursing facility follow up visit for medical management of chronic issues.  Interim medical record and care since last SNF visit was updated with review of diagnostic studies and change in clinical status since last visit were documented.  HPI: Past medical history includes Barrett's esophagus; GERD; hypothyroidism; midgut volvulus; and hypothyroidism. Labs were performed last month and revealed an A1c of 5.9%; total cholesterol 179; HDL 74; LDL 91; and TSH 0.420. She is on atorvastatin  40 mg daily; she has no history of CVA or MI.  2 paternal uncles did have MI; it is unknown whether the MIs occurred before the age of 23.  She has been diagnosed to have atherosclerosis of aorta on imaging.  The last EKG on record was 08/25/2017 and revealed a Q wave in lead I and aVL.  The T wave was low but not inverted in those two leads. Albumin was last checked on 10/14/2021 and was stable at 4.1.  Total protein is current and low normal at 6.3.  In 2019 she did exhibit significant hyperglycemia with a peak glucose of 200.  Since that time there is no recorded glucose greater than 97.  On 01/01/2021 LDL was 147; apparently she was not on a statin at that time.   Review of systems: She states her daughter , a Warden/ranger who feels she has ADD.  She does have Xanax  that she takes only when desperate.  She employs a rolling walker for stability.  Constitutional: No fever, significant weight change, fatigue  Eyes: No redness, discharge, pain, vision change ENT/mouth: No nasal congestion,  purulent discharge, earache, change in hearing, sore throat  Cardiovascular: No chest pain, palpitations, paroxysmal nocturnal dyspnea, claudication, edema  Respiratory: No cough, sputum production, hemoptysis, DOE, significant snoring, apnea   Gastrointestinal: No heartburn, dysphagia, abdominal pain, nausea  /vomiting, rectal bleeding, melena, change in bowels Genitourinary: No dysuria, hematuria, pyuria, incontinence, nocturia Musculoskeletal: No joint stiffness, joint swelling, weakness, pain Dermatologic: No rash, pruritus, change in appearance of skin Neurologic: No dizziness, headache, syncope, seizures, numbness, tingling Psychiatric: No significant depression, insomnia, anorexia Endocrine: No change in hair/skin/nails, excessive thirst, excessive hunger, excessive urination  Hematologic/lymphatic: No significant bruising, lymphadenopathy, abnormal bleeding Allergy/immunology: No itchy/watery eyes, significant sneezing, urticaria, angioedema  Physical exam:  Pertinent or positive findings: Dentition is immaculate.  Heart rhythm is slightly irregular.  There is a grade 1 systolic murmur.  Pedal pulses are palpable.  She has isolated osteoarthritic changes of the hands.  Interosseous wasting is noted.  There is suggestion of scoliosis with prominence of the lower thoracic spine with deviation.  Limb atrophy is also suggested.  General appearance: no acute distress, increased work of breathing is present.   Lymphatic: No lymphadenopathy about the head, neck, axilla. Eyes: No conjunctival inflammation or lid edema is present. There is no scleral icterus. Ears:  External ear exam shows no significant lesions or deformities.   Nose:  External nasal examination shows no deformity or inflammation. Nasal mucosa are pink and moist without lesions, exudates Oral exam:  Lips and gums are healthy appearing. There is no oropharyngeal erythema or exudate. Neck:  No thyromegaly, masses, tenderness noted.    Heart:  No gallop,  click, rub .  Lungs: Chest clear to auscultation without wheezes, rhonchi, rales, rubs. Abdomen: Bowel sounds are normal. Abdomen is soft and nontender with no organomegaly, hernias, masses. GU: Deferred  Extremities:  No cyanosis, clubbing, edema  Neurologic exam :Balance,  Rhomberg, finger to nose testing could not be completed due to clinical state Skin: Warm & dry w/o tenting. No significant lesions or rash.  See summary under each active problem in the Problem List with associated updated therapeutic plan :  Aortic atherosclerosis Although imaging has revealed atherosclerosis; she has no anginal equivalent.  There is no personal history of MI or CVA.   On 01/01/2021 LDL was 147; apparently she was not on a statin at that time.  Statin holiday could be considered with recheck of the LDL after 8-10 weeks off the medicine.  If LDL were excessively high; rosuvastatin might be a better statin choice at her age.  Hypothyroidism TSH is significantly suppressed at 0.420 (normal 0.35-4.5) on L-thyroxine 100 mcg.  I would recommend decreasing to 75 mcg daily at the next refill and rechecking the TSH in 8-10 weeks.  TSH goal for her age will be no lower than 1 and as high as 5 to decrease risk of arrhythmias.  Other idiopathic scoliosis, thoracolumbar region She denies any urinary or stool incontinence or radicular lower extremity symptoms.  She must ambulate with a rolling walker.  I would recommend that she avoid any drugs on the Beers ' List which could increase risk of falls.  This will include a alprazolam  as well as the sedating agent cetirizine .  Loratadine or fexofenadine, nonsedating antihistamines would be preferable.  At risk for falls She should use a nonsedating antihistamine such as loratadine or fexofenadine as opposed to cetirizine .  Additionally alprazolam  should be taken as infrequently as possible.

## 2023-12-18 NOTE — Assessment & Plan Note (Addendum)
 Although imaging has revealed atherosclerosis; she has no anginal equivalent.  There is no personal history of MI or CVA.   On 01/01/2021 LDL was 147; apparently she was not on a statin at that time.  Statin holiday could be considered with recheck of the LDL after 8-10 weeks off the medicine.  If LDL were excessively high; rosuvastatin might be a better statin choice at her age.

## 2023-12-18 NOTE — Assessment & Plan Note (Addendum)
 TSH is significantly suppressed at 0.420 (normal 0.35-4.5) on L-thyroxine 100 mcg.  I would recommend decreasing to 75 mcg daily at the next refill and rechecking the TSH in 8-10 weeks.  TSH goal for her age will be no lower than 1 and as high as 5 to decrease risk of arrhythmias.

## 2023-12-18 NOTE — Assessment & Plan Note (Addendum)
 She denies any urinary or stool incontinence or radicular lower extremity symptoms.  She must ambulate with a rolling walker.  I would recommend that she avoid any drugs on the Beers ' List which could increase risk of falls.  This will include a alprazolam  as well as the sedating agent cetirizine .  Loratadine or fexofenadine, nonsedating antihistamines would be preferable.

## 2023-12-19 DIAGNOSIS — Z9181 History of falling: Secondary | ICD-10-CM | POA: Insufficient documentation

## 2023-12-19 NOTE — Assessment & Plan Note (Signed)
 She should use a nonsedating antihistamine such as loratadine or fexofenadine as opposed to cetirizine .  Additionally alprazolam  should be taken as infrequently as possible.

## 2023-12-24 DIAGNOSIS — R2681 Unsteadiness on feet: Secondary | ICD-10-CM | POA: Diagnosis not present

## 2023-12-24 DIAGNOSIS — M6281 Muscle weakness (generalized): Secondary | ICD-10-CM | POA: Diagnosis not present

## 2023-12-29 DIAGNOSIS — M6281 Muscle weakness (generalized): Secondary | ICD-10-CM | POA: Diagnosis not present

## 2023-12-29 DIAGNOSIS — R2689 Other abnormalities of gait and mobility: Secondary | ICD-10-CM | POA: Diagnosis not present

## 2023-12-31 DIAGNOSIS — M6281 Muscle weakness (generalized): Secondary | ICD-10-CM | POA: Diagnosis not present

## 2023-12-31 DIAGNOSIS — R2681 Unsteadiness on feet: Secondary | ICD-10-CM | POA: Diagnosis not present

## 2023-12-31 DIAGNOSIS — R2689 Other abnormalities of gait and mobility: Secondary | ICD-10-CM | POA: Diagnosis not present

## 2024-01-07 DIAGNOSIS — R2689 Other abnormalities of gait and mobility: Secondary | ICD-10-CM | POA: Diagnosis not present

## 2024-01-07 DIAGNOSIS — R2681 Unsteadiness on feet: Secondary | ICD-10-CM | POA: Diagnosis not present

## 2024-01-07 DIAGNOSIS — M6281 Muscle weakness (generalized): Secondary | ICD-10-CM | POA: Diagnosis not present

## 2024-01-12 DIAGNOSIS — R2689 Other abnormalities of gait and mobility: Secondary | ICD-10-CM | POA: Diagnosis not present

## 2024-01-12 DIAGNOSIS — M6281 Muscle weakness (generalized): Secondary | ICD-10-CM | POA: Diagnosis not present

## 2024-01-12 DIAGNOSIS — R2681 Unsteadiness on feet: Secondary | ICD-10-CM | POA: Diagnosis not present

## 2024-01-15 LAB — CBC WITH DIFFERENTIAL/PLATELET
Absolute Lymphocytes: 1175 {cells}/uL (ref 850–3900)
Absolute Monocytes: 673 {cells}/uL (ref 200–950)
Basophils Absolute: 20 {cells}/uL (ref 0–200)
Basophils Relative: 0.3 %
Eosinophils Absolute: 284 {cells}/uL (ref 15–500)
Eosinophils Relative: 4.3 %
HCT: 41.6 % (ref 35.0–45.0)
Hemoglobin: 13.7 g/dL (ref 11.7–15.5)
MCH: 30 pg (ref 27.0–33.0)
MCHC: 32.9 g/dL (ref 32.0–36.0)
MCV: 91 fL (ref 80.0–100.0)
MPV: 10.9 fL (ref 7.5–12.5)
Monocytes Relative: 10.2 %
Neutro Abs: 4448 {cells}/uL (ref 1500–7800)
Neutrophils Relative %: 67.4 %
Platelets: 189 Thousand/uL (ref 140–400)
RBC: 4.57 Million/uL (ref 3.80–5.10)
RDW: 11.9 % (ref 11.0–15.0)
Total Lymphocyte: 17.8 %
WBC: 6.6 Thousand/uL (ref 3.8–10.8)

## 2024-01-15 LAB — LIPID PANEL
Cholesterol: 238 mg/dL — ABNORMAL HIGH (ref <200)
HDL: 70 mg/dL (ref >=50)
LDL Cholesterol (Calc): 149 mg/dL — ABNORMAL HIGH
Non-HDL Cholesterol (Calc): 168 mg/dL — ABNORMAL HIGH (ref <130)
Total CHOL/HDL Ratio: 3.4 (calc) (ref <5.0)
Triglycerides: 89 mg/dL (ref <150)

## 2024-01-15 LAB — VITAMIN B12: Vitamin B-12: 1330 pg/mL — ABNORMAL HIGH (ref 200–1100)

## 2024-01-15 LAB — COMPREHENSIVE METABOLIC PANEL WITH GFR
AG Ratio: 1.7 (calc) (ref 1.0–2.5)
ALT: 16 U/L (ref 6–29)
AST: 19 U/L (ref 10–35)
Albumin: 4 g/dL (ref 3.6–5.1)
Alkaline phosphatase (APISO): 60 U/L (ref 37–153)
BUN: 20 mg/dL (ref 7–25)
CO2: 28 mmol/L (ref 20–32)
Calcium: 9.2 mg/dL (ref 8.6–10.4)
Chloride: 105 mmol/L (ref 98–110)
Creat: 0.64 mg/dL (ref 0.60–0.95)
Globulin: 2.3 g/dL (ref 1.9–3.7)
Glucose, Bld: 87 mg/dL (ref 65–139)
Potassium: 4.1 mmol/L (ref 3.5–5.3)
Sodium: 141 mmol/L (ref 135–146)
Total Bilirubin: 0.7 mg/dL (ref 0.2–1.2)
Total Protein: 6.3 g/dL (ref 6.1–8.1)
eGFR: 85 mL/min/1.73m2 (ref >=60)

## 2024-01-15 LAB — TSH: TSH: 0.42 m[IU]/L (ref 0.40–4.50)

## 2024-01-15 LAB — HEMOGLOBIN A1C
Hgb A1c MFr Bld: 5.9 % — ABNORMAL HIGH (ref <5.7)
Mean Plasma Glucose: 123 mg/dL
eAG (mmol/L): 6.8 mmol/L

## 2024-01-15 LAB — VITAMIN D 25 HYDROXY (VIT D DEFICIENCY, FRACTURES): Vit D, 25-Hydroxy: 75 ng/mL (ref 30–100)

## 2024-01-19 ENCOUNTER — Ambulatory Visit: Payer: Self-pay | Admitting: Nurse Practitioner

## 2024-02-09 ENCOUNTER — Telehealth: Payer: Self-pay

## 2024-02-09 DIAGNOSIS — E039 Hypothyroidism, unspecified: Secondary | ICD-10-CM

## 2024-02-09 MED ORDER — LEVOTHYROXINE SODIUM 100 MCG PO TABS: 100.0000 ug | ORAL_TABLET | Freq: Every day | ORAL | 1 refills | Status: AC

## 2024-02-09 NOTE — Telephone Encounter (Signed)
 Refill sent as requested.

## 2024-02-17 ENCOUNTER — Telehealth: Payer: Self-pay | Admitting: Nurse Practitioner

## 2024-02-17 NOTE — Telephone Encounter (Unsigned)
 Copied from CRM #[id number]. Topic: Clinical - Medication Refill >> Feb 17, 2024  3:47 PM Drema MATSU wrote: Medication: ALPRAZolam  (XANAX ) 0.25 MG tablet  Has the patient contacted their pharmacy? Yes (Agent: If no, request that the patient contact the pharmacy for the refill. If patient does not wish to contact the pharmacy document the reason why and proceed with request.) no more refills  (Agent: If yes, when and what did the pharmacy advise?)  This is the patient's preferred pharmacy:  [id number] - [place], [place] - [address] AT [facility] [address] [facility] [address] [place] [address] Phone: [phone] Fax: [phone]  Is this the correct pharmacy for this prescription? Yes If no, delete pharmacy and type the correct one.   Has the prescription been filled recently? Yes  Is the patient out of the medication? Yes  Has the patient been seen for an appointment in the last year OR does the patient have an upcoming appointment? Yes  Can we respond through MyChart? Yes  Agent: Please be advised that Rx refills may take up to 3 business days. We ask that you follow-up with your pharmacy.

## 2024-02-18 ENCOUNTER — Non-Acute Institutional Stay: Admitting: Nurse Practitioner

## 2024-02-18 ENCOUNTER — Encounter: Payer: Self-pay | Admitting: Nurse Practitioner

## 2024-02-18 VITALS — BP 132/78 | HR 89 | Temp 96.9°F | Ht 61.0 in | Wt 126.8 lb

## 2024-02-18 DIAGNOSIS — K219 Gastro-esophageal reflux disease without esophagitis: Secondary | ICD-10-CM

## 2024-02-18 DIAGNOSIS — K5904 Chronic idiopathic constipation: Secondary | ICD-10-CM

## 2024-02-18 DIAGNOSIS — R0982 Postnasal drip: Secondary | ICD-10-CM | POA: Diagnosis not present

## 2024-02-18 DIAGNOSIS — E785 Hyperlipidemia, unspecified: Secondary | ICD-10-CM | POA: Diagnosis not present

## 2024-02-18 DIAGNOSIS — R7303 Prediabetes: Secondary | ICD-10-CM

## 2024-02-18 DIAGNOSIS — E039 Hypothyroidism, unspecified: Secondary | ICD-10-CM | POA: Diagnosis not present

## 2024-02-18 DIAGNOSIS — F39 Unspecified mood [affective] disorder: Secondary | ICD-10-CM

## 2024-02-18 MED ORDER — OMEPRAZOLE 20 MG PO CPDR: 20.0000 mg | DELAYED_RELEASE_CAPSULE | Freq: Every day | ORAL | 1 refills | Status: AC

## 2024-02-18 NOTE — Telephone Encounter (Signed)
 Yes we discussed today and stopped medication, she takes sparingly and was okay with stopping

## 2024-02-18 NOTE — Telephone Encounter (Signed)
 Noted

## 2024-02-18 NOTE — Telephone Encounter (Signed)
 Unable to pend

## 2024-02-18 NOTE — Progress Notes (Signed)
 Careteam: Patient Care Team: [name], NP as PCP - General (Geriatric Medicine) Fate Morna SAILOR, [facility] (Inactive) as Pharmacist (Pharmacist) PLACE OF SERVICE:  [facility]   Advanced Directive information Does Patient Have a Medical Advance Directive?: Yes, Type of Advance Directive: Healthcare Power of [place];Living will;Out of facility DNR (pink MOST or yellow form), Does patient want to make changes to medical advance directive?: No - Patient declined  Allergies  Allergen Reactions   Codeine Nausea And Vomiting   Prevident 5000 Enamel Protect [Sod Fluoride-Potassium Nitrate]     Mouth ulcer developed   Adhesive [Tape] Rash    Paper tape is okay   Latex Rash    Probably adhesive allergy.  bandaids bother patient    Chief Complaint  Patient presents with   Medical Management of Chronic Issues    Medical Management of Chronic Issues. 1 Month follow up     HPI: Patient is a 89 y.o. female seen in today follow up at [facility] Discussed the use of AI scribe software for clinical note transcription with the patient, who gave verbal consent to proceed.  History of Present Illness Diana Owen is an 89 year old female who presents for a one-month follow-up visit.  She experienced a recent episode of congestion and coughing spells, which have improved with the use of cough drops and a medication for congestion. The sputum is described as either white or beige, and the coughing spells are manageable and not severe.  She has hyperlipidemia and is currently taking Lipitor 40 mg daily.  She has hypothyroidism and is on levothyroxine  (Synthroid ) 100 mcg daily.  She has a history of esophageal strictures and has had her esophagus stretched twice, about 20 years ago. She experiences difficulty swallowing, especially with certain foods like cream potatoes, and manages this by eating soups and stews.   She is on omeprazole , which she takes every other  day.  She has a history of prediabetes with an A1c of 5.12% as of September.  She also takes Miralax daily to manage constipation.  She has a history of anxiety and depression and is on Lexapro  5 mg, which she recently resumed after a period without it due to pharmacy issues. Recently got refilled and restarted She also has alprazolam  prescribed but has not used recently  She underwent physical therapy recently.  No chest pain, palpitations, or significant indigestion. Reports manageable constipation.  Review of Systems:  Review of Systems  Constitutional:  Negative for chills, fever and weight loss.  HENT:  Positive for congestion. Negative for tinnitus.   Respiratory:  Negative for cough, sputum production and shortness of breath.   Cardiovascular:  Negative for chest pain, palpitations and leg swelling.  Gastrointestinal:  Negative for abdominal pain, constipation, diarrhea and heartburn.  Genitourinary:  Negative for dysuria, frequency and urgency.  Musculoskeletal:  Negative for back pain, falls, joint pain and myalgias.  Skin: Negative.   Neurological:  Negative for dizziness and headaches.  Endo/Heme/Allergies:  Positive for environmental allergies.  Psychiatric/Behavioral:  Negative for depression and memory loss. The patient is nervous/anxious (has restarted lexapro ). The patient does not have insomnia.     Past Medical History:  Diagnosis Date   Anxiety    Arthritis    finger!!   Barrett's esophagus 2000   GERD (gastroesophageal reflux disease)    History of hiatal hernia    Hypothyroidism    Intestinal adhesions with complete obstruction (HCC) 04/25/2017   Intractable vomiting  with nausea    occurs with each obstructive episode. if patient gets up during meal to let food pass, then n & v is not so bad   Midgut volvulus (HCC)    SBO (small bowel obstruction) (HCC) 11/13/2016   Small bowel obstruction (HCC)    Small bowel obstruction due to postoperative adhesions  06/29/2017   Thyroid  disease    Past Surgical History:  Procedure Laterality Date   APPENDECTOMY  2002   LAPAROTOMY N/A 09/28/2017   Procedure: EXPLORATORY LAPAROTOMY WITH LYSIS OF ADHESIONS;  Surgeon: Nicholaus Selinda Birmingham, MD;  Location: [facility] ORS;  Service: General;  Laterality: N/A;   PARTIAL COLECTOMY  2002   large polyp   TONSILLECTOMY     TUBAL LIGATION     Social History:   reports that she has never smoked. She has been exposed to tobacco smoke. She has never used smokeless tobacco. She reports current alcohol use of about 7.0 standard drinks of alcohol per week. She reports that she does not use drugs.  Family History  Problem Relation Age of Onset   Leukemia Mother        CLL   Leukemia Father    Hyperlipidemia Brother    Stroke Brother    Heart disease Paternal Uncle    Diabetes Neg Hx     Medications: Patient's Medications  New Prescriptions   No medications on file  Previous Medications   ACETAMINOPHEN  (TYLENOL ) 500 MG TABLET    Take 500 mg by mouth every 6 (six) hours as needed.   ALPRAZOLAM  (XANAX ) 0.25 MG TABLET    Take 0.5 tablets (0.125 mg total) by mouth at bedtime as needed for anxiety. TAKE 1/2 TABLET (0.125) BY MOUTH DAILY AS NEEDED FOR ANXIETY   AMOXICILLIN -CLAVULANATE (AUGMENTIN ) 875-125 MG TABLET    Take 1 tablet by mouth 2 (two) times daily.   ATORVASTATIN  (LIPITOR) 40 MG TABLET    Take 1 tablet (40 mg total) by mouth daily.   BIOTIN PO    Take by mouth.   CETIRIZINE  (ZYRTEC  ALLERGY) 10 MG TABLET    Take 1 tablet (10 mg total) by mouth daily.   CHOLECALCIFEROL (VITAMIN D ) 1000 UNITS TABLET    Take 2,000 Units by mouth daily.   CYANOCOBALAMIN  (VITAMIN B-12) 6000 MCG SUBL    Place 1 tablet under the tongue daily.   ESCITALOPRAM  (LEXAPRO ) 5 MG TABLET    Take 1 tablet (5 mg total) by mouth every other day.   LEVOTHYROXINE  (SYNTHROID ) 100 MCG TABLET    Take 1 tablet (100 mcg total) by mouth daily before breakfast.   MECLIZINE  (ANTIVERT ) 25 MG TABLET    Take 25  mg by mouth 3 (three) times daily as needed for dizziness.   MULTIPLE VITAMINS-MINERALS (MULTIVITAMIN WITH MINERALS) TABLET    Take 1 tablet by mouth daily.   OMEPRAZOLE  (PRILOSEC) 20 MG CAPSULE    Take 40 mg by mouth daily.   POLYETHYLENE GLYCOL (MIRALAX / GLYCOLAX) PACKET    Take 17 g by mouth daily.   SIMETHICONE (MYLICON) 80 MG CHEWABLE TABLET    Chew 80 mg by mouth every 6 (six) hours as needed for flatulence.   VITAMIN E 100 UNIT CAPSULE    Take 100 Units by mouth daily.  Modified Medications   No medications on file  Discontinued Medications   No medications on file    Physical Exam:  Vitals:   02/18/24 1003  BP: 132/78  Pulse: 89  Temp: (!) 96.9 F (36.1  C)  SpO2: 96%  Weight: 126 lb 12.8 oz (57.5 kg)  Height: 5' 1 (1.549 m)   Body mass index is 23.96 kg/m. Wt Readings from Last 3 Encounters:  02/18/24 126 lb 12.8 oz (57.5 kg)  12/18/23 128 lb 3.2 oz (58.2 kg)  11/13/23 126 lb (57.2 kg)    Physical Exam Constitutional:      General: She is not in acute distress.    Appearance: She is well-developed. She is not diaphoretic.  HENT:     Head: Normocephalic and atraumatic.     Mouth/Throat:     Pharynx: No oropharyngeal exudate.  Eyes:     Conjunctiva/sclera: Conjunctivae normal.     Pupils: Pupils are equal, round, and reactive to light.  Cardiovascular:     Rate and Rhythm: Normal rate and regular rhythm.     Heart sounds: Normal heart sounds.  Pulmonary:     Effort: Pulmonary effort is normal.     Breath sounds: Normal breath sounds.  Abdominal:     General: Bowel sounds are normal.     Palpations: Abdomen is soft.  Musculoskeletal:     Cervical back: Normal range of motion and neck supple.     Right lower leg: No edema.     Left lower leg: No edema.  Skin:    General: Skin is warm and dry.  Neurological:     Mental Status: She is alert.  Psychiatric:        Mood and Affect: Mood normal.     Labs reviewed: Basic Metabolic Panel: Recent Labs     08/11/23 0814  NA 141  K 4.1  CL 105  CO2 28  GLUCOSE 87  BUN 20  CREATININE 0.64  CALCIUM  9.2  TSH 0.42   Liver Function Tests: Recent Labs    08/11/23 0814  AST 19  ALT 16  BILITOT 0.7  PROT 6.3   No results for input(s): LIPASE, AMYLASE in the last 8760 hours. No results for input(s): AMMONIA in the last 8760 hours. CBC: Recent Labs    08/11/23 0814 10/06/23 1234  WBC 6.6 9.5  NEUTROABS 4,448 6,755  HGB 13.7 13.8  HCT 41.6 41.8  MCV 91.0 91.7  PLT 189 242   Lipid Panel: Recent Labs    08/11/23 0814 12/14/23 0837  CHOL 238* 179  HDL 70 74  LDLCALC 149* 91  TRIG 89 48  CHOLHDL 3.4 2.4   TSH: Recent Labs    08/11/23 0814  TSH 0.42   A1C: Lab Results  Component Value Date   HGBA1C 5.9 (H) 12/14/2023     Assessment/Plan Assessment and Plan Assessment & Plan Prediabetes A1c is at goal - Continue dietary modifications.  Hyperlipidemia LDL cholesterol has significantly decreased with current treatment. - Continue Lipitor 40 mg daily.  Hypothyroidism Thyroid  levels are on the low end of normal. Current treatment with levothyroxine  is appropriate. - Continue levothyroxine  100 mg daily. - Recheck thyroid  TSH levels.  Gastroesophageal reflux disease Experiencing difficulty swallowing, due to esophageal stricture. Omeprazole  is used for acid reflux management. - Continue omeprazole  20 mg daily. - Implement dietary modifications: small bites, thorough chewing, increased fluid intake.  Postnasal drip Experiencing congestion and postnasal drip, possibly related to allergies. - Continue antihistamine such as Claritin or Zyrtec .  Mood disorder Anxiety and depression managed with Lexapro . Recent lapse in medication due to pharmacy issues, but now resumed. - Continue Lexapro  5 mg daily. - will stop use of alprazolam  due to sedative effects  and risk of falls.  Constipation Managed with Miralax. - Continue Miralax daily.  General  Health Maintenance Routine follow-up and lab work are necessary to monitor overall health. - Scheduled labs for Monday at 7:30 AM at home. - Scheduled annual wellness visit for December 30th. - Scheduled routine follow-up in six months on June 4th at 10:20 AM.  Roddy Bellamy K. Caro BODILY  [facility] [phone]

## 2024-02-18 NOTE — Patient Instructions (Addendum)
 Come to [facility] on [facility] 02/22/24 at 7:30 AM

## 2024-02-18 NOTE — Telephone Encounter (Signed)
 Please Advise, patient was seen today at [facility] and it asking for alprazolam  but is not current on medication list.   Message sent to [name], NP

## 2024-02-25 ENCOUNTER — Ambulatory Visit: Payer: Self-pay | Admitting: Nurse Practitioner

## 2024-02-25 LAB — COMPREHENSIVE METABOLIC PANEL WITH GFR
AG Ratio: 2 (calc) (ref 1.0–2.5)
ALT: 18 U/L (ref 6–29)
AST: 23 U/L (ref 10–35)
Albumin: 4.5 g/dL (ref 3.6–5.1)
Alkaline phosphatase (APISO): 79 U/L (ref 37–153)
BUN: 19 mg/dL (ref 7–25)
CO2: 30 mmol/L (ref 20–32)
Calcium: 9.3 mg/dL (ref 8.6–10.4)
Chloride: 102 mmol/L (ref 98–110)
Creat: 0.61 mg/dL (ref 0.60–0.95)
Globulin: 2.2 g/dL (ref 1.9–3.7)
Glucose, Bld: 110 mg/dL — ABNORMAL HIGH (ref 65–99)
Potassium: 3.9 mmol/L (ref 3.5–5.3)
Sodium: 142 mmol/L (ref 135–146)
Total Bilirubin: 1 mg/dL (ref 0.2–1.2)
Total Protein: 6.7 g/dL (ref 6.1–8.1)
eGFR: 86 mL/min/1.73m2 (ref >=60)

## 2024-02-25 LAB — CBC WITH DIFFERENTIAL/PLATELET
Absolute Lymphocytes: 1736 {cells}/uL (ref 850–3900)
Absolute Monocytes: 650 {cells}/uL (ref 200–950)
Basophils Absolute: 33 {cells}/uL (ref 0–200)
Basophils Relative: 0.5 %
Eosinophils Absolute: 273 {cells}/uL (ref 15–500)
Eosinophils Relative: 4.2 %
HCT: 44.1 % (ref 35.9–46.0)
Hemoglobin: 14.8 g/dL (ref 11.7–15.5)
MCH: 30.3 pg (ref 27.0–33.0)
MCHC: 33.6 g/dL (ref 31.6–35.4)
MCV: 90.4 fL (ref 81.4–101.7)
MPV: 11.1 fL (ref 7.5–12.5)
Monocytes Relative: 10 %
Neutro Abs: 3809 {cells}/uL (ref 1500–7800)
Neutrophils Relative %: 58.6 %
Platelets: 222 Thousand/uL (ref 140–400)
RBC: 4.88 Million/uL (ref 3.80–5.10)
RDW: 11.7 % (ref 11.0–15.0)
Total Lymphocyte: 26.7 %
WBC: 6.5 Thousand/uL (ref 3.8–10.8)

## 2024-02-25 LAB — TSH: TSH: 1.52 m[IU]/L (ref 0.40–4.50)

## 2024-03-15 ENCOUNTER — Ambulatory Visit: Payer: Self-pay | Admitting: Nurse Practitioner

## 2024-03-24 ENCOUNTER — Encounter: Payer: Self-pay | Admitting: Nurse Practitioner

## 2024-03-24 ENCOUNTER — Ambulatory Visit: Admitting: Nurse Practitioner

## 2024-03-24 VITALS — BP 122/64 | HR 71 | Temp 97.7°F | Ht 61.0 in | Wt 125.4 lb

## 2024-03-24 DIAGNOSIS — Z0000 Encounter for general adult medical examination without abnormal findings: Secondary | ICD-10-CM | POA: Diagnosis not present

## 2024-03-24 DIAGNOSIS — F39 Unspecified mood [affective] disorder: Secondary | ICD-10-CM | POA: Diagnosis not present

## 2024-03-24 MED ORDER — ESCITALOPRAM OXALATE 5 MG PO TABS: 5.0000 mg | ORAL_TABLET | ORAL | 1 refills | Status: AC

## 2024-03-24 NOTE — Progress Notes (Signed)
 "  Chief Complaint  Patient presents with   Medicare Wellness    AWV     Subjective:   Diana Owen is a 89 y.o. female who presents for a Medicare Annual Wellness Visit.  Visit info / Clinical Intake: Medicare Wellness Visit Type:: Subsequent Annual Wellness Visit Persons participating in visit and providing information:: patient Medicare Wellness Visit Mode:: In-person (required for WTM) Interpreter Needed?: No Pre-visit prep was completed: no AWV questionnaire completed by patient prior to visit?: no Living arrangements:: (!) lives alone Patient's Overall Health Status Rating: good Typical amount of pain: none Does pain affect daily life?: no Are you currently prescribed opioids?: no  Dietary Habits and Nutritional Risks How many meals a day?: 4 Eats fruit and vegetables daily?: yes Most meals are obtained by: eating out In the last 2 weeks, have you had any of the following?: none Diabetic:: no  Functional Status Activities of Daily Living (to include ambulation/medication): Independent Ambulation: Independent with device- listed below Home Assistive Devices/Equipment: Walker (specify Type) Medication Administration: Independent Home Management (perform basic housework or laundry): Independent Manage your own finances?: yes Primary transportation is: driving Concerns about vision?: no *vision screening is required for WTM* Concerns about hearing?: no  Fall Screening Falls in the past year?: 0 Number of falls in past year: 0 Was there an injury with Fall?: 0 Fall Risk Category Calculator: 0 Patient Fall Risk Level: Low Fall Risk  Fall Risk Patient at Risk for Falls Due to: Impaired mobility Fall risk Follow up: Falls evaluation completed  Home and Transportation Safety: All rugs have non-skid backing?: yes All stairs or steps have railings?: yes Grab bars in the bathtub or shower?: yes Have non-skid surface in bathtub or shower?: yes Good home lighting?:  yes Regular seat belt use?: yes Hospital stays in the last year:: no  Cognitive Assessment Difficulty concentrating, remembering, or making decisions? : no Will 6CIT or Mini Cog be Completed: yes What year is it?: 0 points What month is it?: 0 points Give patient an address phrase to remember (5 components): [address] [place]  About what time is it?: 0 points Count backwards from 20 to 1: 0 points Say the months of the year in reverse: 0 points Repeat the address phrase from earlier: 0 points 6 CIT Score: 0 points  Advance Directives (For Healthcare) Does Patient Have a Medical Advance Directive?: Yes Does patient want to make changes to medical advance directive?: No - Patient declined Type of Advance Directive: Healthcare Power of [place]; Out of facility DNR (pink MOST or yellow form); Living will Copy of Healthcare Power of Attorney in Chart?: No - copy requested Copy of Living Will in Chart?: No - copy requested Out of facility DNR (pink MOST or yellow form) in Chart? (Ambulatory ONLY): Yes - validated most recent copy scanned in chart  Reviewed/Updated  Reviewed/Updated: Reviewed All (Medical, Surgical, Family, Medications, Allergies, Care Teams, Patient Goals)    Allergies (verified) Codeine, Prevident 5000 enamel protect [sod fluoride-potassium nitrate], Adhesive [tape], and Latex   Current Medications (verified) Outpatient Encounter Medications as of 03/24/2024  Medication Sig   acetaminophen  (TYLENOL ) 500 MG tablet Take 500 mg by mouth every 6 (six) hours as needed.   atorvastatin  (LIPITOR) 40 MG tablet Take 1 tablet (40 mg total) by mouth daily.   BIOTIN PO Take by mouth.   cetirizine  (ZYRTEC  ALLERGY) 10 MG tablet Take 1 tablet (10 mg total) by mouth daily.   cholecalciferol (VITAMIN D ) 1000 units tablet  Take 2,000 Units by mouth daily.   Cyanocobalamin  (VITAMIN B-12) 6000 MCG SUBL Place 1 tablet under the tongue daily.   levothyroxine  (SYNTHROID ) 100  MCG tablet Take 1 tablet (100 mcg total) by mouth daily before breakfast.   meclizine  (ANTIVERT ) 25 MG tablet Take 25 mg by mouth 3 (three) times daily as needed for dizziness.   Misc Natural Products (MAGIC MUSHROOM MIX) CAPS Take 1 capsule by mouth daily.   Multiple Vitamins-Minerals (MULTIVITAMIN WITH MINERALS) tablet Take 1 tablet by mouth daily.   omeprazole  (PRILOSEC) 20 MG capsule Take 1 capsule (20 mg total) by mouth daily.   polyethylene glycol (MIRALAX / GLYCOLAX) packet Take 17 g by mouth daily.   simethicone (MYLICON) 80 MG chewable tablet Chew 80 mg by mouth every 6 (six) hours as needed for flatulence.   vitamin E 100 UNIT capsule Take 100 Units by mouth daily.   [DISCONTINUED] escitalopram  (LEXAPRO ) 5 MG tablet Take 1 tablet (5 mg total) by mouth every other day.   escitalopram  (LEXAPRO ) 5 MG tablet Take 1 tablet (5 mg total) by mouth every other day.   No facility-administered encounter medications on file as of 03/24/2024.    History: Past Medical History:  Diagnosis Date   Anxiety    Arthritis    finger!!   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 if patient gets up during meal to let food pass, then n & v is not so bad   Midgut volvulus (HCC)    SBO (small bowel obstruction) (HCC) 11/13/2016   Small bowel obstruction (HCC)    Small bowel obstruction due to postoperative adhesions 06/29/2017   Thyroid  disease    Past Surgical History:  Procedure Laterality Date   APPENDECTOMY  2002   LAPAROTOMY N/A 09/28/2017   Procedure: EXPLORATORY LAPAROTOMY WITH LYSIS OF ADHESIONS;  Surgeon: Nicholaus Selinda Birmingham, MD;  Location: [facility] ORS;  Service: General;  Laterality: N/A;   PARTIAL COLECTOMY  2002   large polyp   TONSILLECTOMY     TUBAL LIGATION     Family History   Problem Relation Age of Onset   Leukemia Mother        CLL   Leukemia Father    Hyperlipidemia Brother    Stroke Brother    Heart disease Paternal Uncle    Diabetes Neg Hx    Social History   Occupational History   Occupation: Diplomatic Services Operational Officer   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    Comment: retired  Tobacco Use   Smoking status: Never    Passive exposure: Past   Smokeless tobacco: Never  Vaping Use   Vaping status: Never Used  Substance and Sexual Activity   Alcohol use: Yes    Alcohol/week: 7.0 standard drinks of alcohol    Types: 7 Glasses of wine per week    Comment: rare wine    Drug use: No   Sexual activity: Yes   Tobacco Counseling Counseling given: Not Answered  SDOH Screenings   Food Insecurity: No Food Insecurity (03/24/2024)  Housing: Unknown (03/24/2024)  Transportation Needs: No Transportation Needs (03/24/2024)  Utilities: Not At Risk (03/24/2024)  Depression (PHQ2-9): Low Risk (03/24/2024)  Financial Resource Strain: Low Risk (04/18/2021)  Tobacco Use: Low Risk (03/24/2024)   See flowsheets for full screening details  Depression  Screen PHQ 2 & 9 Depression Scale- Over the past 2 weeks, how often have you been bothered by any of the following problems? Little interest or pleasure in doing things: 0 Feeling down, depressed, or hopeless (PHQ Adolescent also includes...irritable): 0 PHQ-2 Total Score: 0     Goals Addressed   None          Objective:    Today's Vitals   03/24/24 0946  BP: 122/64  Pulse: 71  Temp: 97.7 F (36.5 C)  SpO2: 96%  Weight: 125 lb 6.4 oz (56.9 kg)  Height: 5' 1 (1.549 m)   Body mass index is 23.69 kg/m.  Hearing/Vision screen Vision Screening - Comments:: [facility] Last Exam 2 years ago Immunizations and Health Maintenance Health Maintenance  Topic Date Due   Medicare Annual Wellness (AWV)  03/05/2024   COVID-19 Vaccine (9 - 2025-26 season) 06/29/2024   DTaP/Tdap/Td (3 - Td or Tdap) 03/30/2033   Pneumococcal  Vaccine: 50+ Years  Completed   Influenza Vaccine  Completed   Bone Density Scan  Completed   Zoster Vaccines- Shingrix  Completed   Meningococcal B Vaccine  Aged Out        Assessment/Plan:  This is a routine wellness examination for Solyana.  Patient Care Team: [name], NP as PCP - General (Geriatric Medicine) Fate Morna SAILOR, [facility] (Inactive) as Pharmacist (Pharmacist)  I have personally reviewed and noted the following in the patients chart:   Medical and social history Use of alcohol, tobacco or illicit drugs  Current medications and supplements including opioid prescriptions. Functional ability and status Nutritional status Physical activity Advanced directives List of other physicians Hospitalizations, surgeries, and ER visits in previous 12 months Vitals Screenings to include cognitive, depression, and falls Referrals and appointments  No orders of the defined types were placed in this encounter.  In addition, I have reviewed and discussed with patient certain preventive protocols, quality metrics, and best practice recommendations. A written personalized care plan for preventive services as well as general preventive health recommendations were provided to patient.   Harlene POUR Caro, NP   03/24/2024   No follow-ups on file.  After Visit Summary: (In Person-Printed) AVS printed and given to the patient  "

## 2024-03-24 NOTE — Patient Instructions (Signed)
 Diana Owen,  Thank you for taking the time for your Medicare Wellness Visit. I appreciate your continued commitment to your health goals. Please review the care plan we discussed, and feel free to reach out if I can assist you further.  Please note that Annual Wellness Visits do not include a physical exam. Some assessments may be limited, especially if the visit was conducted virtually. If needed, we may recommend an in-person follow-up with your provider.  Ongoing Care Seeing your primary care provider every 3 to 6 months helps us  monitor your health and provide consistent, personalized care.   Referrals If a referral was made during today's visit and you haven't received any updates within two weeks, please contact the referred provider directly to check on the status.  Recommended Screenings:  Health Maintenance  Topic Date Due   Medicare Annual Wellness Visit  03/05/2024   COVID-19 Vaccine (9 - 2025-26 season) 06/29/2024   DTaP/Tdap/Td vaccine (3 - Td or Tdap) 03/30/2033   Pneumococcal Vaccine for age over 53  Completed   Flu Shot  Completed   Osteoporosis screening with Bone Density Scan  Completed   Zoster (Shingles) Vaccine  Completed   Meningitis B Vaccine  Aged Out       03/24/2024    9:51 AM  Advanced Directives  Does Patient Have a Medical Advance Directive? Yes  Type of Estate Agent of [place];Out of facility DNR (pink MOST or yellow form);Living will  Does patient want to make changes to medical advance directive? No - Patient declined  Copy of Healthcare Power of Attorney in Chart? No - copy requested    Vision: Annual vision screenings are recommended for early detection of glaucoma, cataracts, and diabetic retinopathy. These exams can also reveal signs of chronic conditions such as diabetes and high blood pressure.  Dental: Annual dental screenings help detect early signs of oral cancer, gum disease, and other conditions linked to overall  health, including heart disease and diabetes.  Please see the attached documents for additional preventive care recommendations.

## 2024-04-05 ENCOUNTER — Telehealth: Payer: Self-pay

## 2024-04-05 NOTE — Telephone Encounter (Signed)
 Copied from CRM [id number]. Topic: Clinical - Prescription Issue >> Apr 05, 2024  9:55 AM Cherylann RAMAN wrote: Reason for CRM: Patient called and stated that she spoke with a the pharmacist and they stated that the medication escitalopram  (LEXAPRO ) 5 MG tablet  was not received in the pharmacy. Patient can contacted at  [phone]

## 2024-04-05 NOTE — Telephone Encounter (Signed)
 I called the pharmacy, spoke with [place] and she explained that they did receive prescrption on 03/24/2024 and said perhaps patient was checking on the app and looking at an old RX number for escitalopram . Medford stated she would get prescription ready for pick up.  Patient aware of the above and confirmed that she was checking on the app

## 2024-08-18 ENCOUNTER — Ambulatory Visit: Admitting: Nurse Practitioner

## 2025-03-28 ENCOUNTER — Ambulatory Visit: Admitting: Nurse Practitioner
